# Patient Record
Sex: Male | Born: 1940
Health system: Southern US, Community
[De-identification: ages and names within clinical notes are randomized; demographics above are authoritative.]

## PROBLEM LIST (undated history)

## (undated) DIAGNOSIS — C4490 Unspecified malignant neoplasm of skin, unspecified: Secondary | ICD-10-CM

## (undated) DIAGNOSIS — J309 Allergic rhinitis, unspecified: Secondary | ICD-10-CM

## (undated) DIAGNOSIS — R350 Frequency of micturition: Secondary | ICD-10-CM

## (undated) DIAGNOSIS — B029 Zoster without complications: Secondary | ICD-10-CM

## (undated) DIAGNOSIS — B0229 Other postherpetic nervous system involvement: Secondary | ICD-10-CM

## (undated) DIAGNOSIS — J449 Chronic obstructive pulmonary disease, unspecified: Secondary | ICD-10-CM

## (undated) DIAGNOSIS — H543 Unqualified visual loss, both eyes: Secondary | ICD-10-CM

## (undated) DIAGNOSIS — D509 Iron deficiency anemia, unspecified: Secondary | ICD-10-CM

## (undated) DIAGNOSIS — E785 Hyperlipidemia, unspecified: Secondary | ICD-10-CM

## (undated) DIAGNOSIS — R7302 Impaired glucose tolerance (oral): Secondary | ICD-10-CM

## (undated) DIAGNOSIS — N189 Chronic kidney disease, unspecified: Secondary | ICD-10-CM

## (undated) DIAGNOSIS — Z0000 Encounter for general adult medical examination without abnormal findings: Secondary | ICD-10-CM

## (undated) DIAGNOSIS — I219 Acute myocardial infarction, unspecified: Secondary | ICD-10-CM

## (undated) DIAGNOSIS — I509 Heart failure, unspecified: Secondary | ICD-10-CM

## (undated) DIAGNOSIS — N400 Enlarged prostate without lower urinary tract symptoms: Secondary | ICD-10-CM

## (undated) DIAGNOSIS — R972 Elevated prostate specific antigen [PSA]: Secondary | ICD-10-CM

## (undated) DIAGNOSIS — R5383 Other fatigue: Secondary | ICD-10-CM

## (undated) DIAGNOSIS — Z125 Encounter for screening for malignant neoplasm of prostate: Secondary | ICD-10-CM

## (undated) DIAGNOSIS — N529 Male erectile dysfunction, unspecified: Secondary | ICD-10-CM

## (undated) DIAGNOSIS — K5090 Crohn's disease, unspecified, without complications: Secondary | ICD-10-CM

## (undated) DIAGNOSIS — R300 Dysuria: Secondary | ICD-10-CM

## (undated) DIAGNOSIS — I10 Essential (primary) hypertension: Secondary | ICD-10-CM

## (undated) DIAGNOSIS — F1010 Alcohol abuse, uncomplicated: Secondary | ICD-10-CM

## (undated) DIAGNOSIS — I25810 Atherosclerosis of coronary artery bypass graft(s) without angina pectoris: Secondary | ICD-10-CM

## (undated) HISTORY — DX: Impaired glucose tolerance (oral): R73.02

## (undated) HISTORY — PX: CORONARY ARTERY BYPASS GRAFT: SHX141

## (undated) HISTORY — DX: Crohn's disease, unspecified, without complications: K50.90

## (undated) HISTORY — DX: Dysuria: R30.0

## (undated) HISTORY — DX: Benign prostatic hyperplasia without lower urinary tract symptoms: N40.0

## (undated) HISTORY — DX: Other fatigue: R53.83

## (undated) HISTORY — DX: Hyperlipidemia, unspecified: E78.5

## (undated) HISTORY — DX: Elevated prostate specific antigen (PSA): R97.20

## (undated) HISTORY — DX: Iron deficiency anemia, unspecified: D50.9

## (undated) HISTORY — DX: Zoster without complications: B02.9

## (undated) HISTORY — DX: Allergic rhinitis, unspecified: J30.9

## (undated) HISTORY — DX: Encounter for general adult medical examination without abnormal findings: Z00.00

## (undated) HISTORY — PX: CORONARY ANGIOPLASTY WITH STENT PLACEMENT: SHX49

## (undated) HISTORY — DX: Atherosclerosis of coronary artery bypass graft(s) without angina pectoris: I25.810

## (undated) HISTORY — DX: Male erectile dysfunction, unspecified: N52.9

## (undated) HISTORY — DX: Acute myocardial infarction, unspecified: I21.9

## (undated) HISTORY — DX: Alcohol abuse, uncomplicated: F10.10

## (undated) HISTORY — DX: Other postherpetic nervous system involvement: B02.29

## (undated) HISTORY — DX: Unqualified visual loss, both eyes: H54.3

## (undated) HISTORY — DX: Essential (primary) hypertension: I10

## (undated) HISTORY — DX: Chronic obstructive pulmonary disease, unspecified: J44.9

## (undated) HISTORY — DX: Chronic kidney disease, unspecified: N18.9

## (undated) HISTORY — DX: Unspecified malignant neoplasm of skin, unspecified: C44.90

## (undated) HISTORY — DX: Heart failure, unspecified: I50.9

## (undated) HISTORY — DX: Frequency of micturition: R35.0

## (undated) HISTORY — DX: Encounter for screening for malignant neoplasm of prostate: Z12.5

---

## 1998-05-22 ENCOUNTER — Ambulatory Visit (HOSPITAL_COMMUNITY): Admission: RE | Admit: 1998-05-22 | Discharge: 1998-05-22 | Payer: Self-pay | Admitting: Internal Medicine

## 2003-10-01 ENCOUNTER — Emergency Department (HOSPITAL_COMMUNITY): Admission: AD | Admit: 2003-10-01 | Discharge: 2003-10-01 | Payer: Self-pay | Admitting: Family Medicine

## 2004-12-31 ENCOUNTER — Ambulatory Visit: Payer: Self-pay | Admitting: Cardiology

## 2005-01-08 ENCOUNTER — Ambulatory Visit: Payer: Self-pay

## 2005-04-04 ENCOUNTER — Ambulatory Visit: Payer: Self-pay | Admitting: Cardiology

## 2006-01-06 ENCOUNTER — Ambulatory Visit: Payer: Self-pay | Admitting: Cardiology

## 2006-03-25 ENCOUNTER — Ambulatory Visit: Payer: Self-pay | Admitting: Cardiology

## 2006-10-06 ENCOUNTER — Ambulatory Visit: Payer: Self-pay | Admitting: Internal Medicine

## 2006-10-07 ENCOUNTER — Ambulatory Visit: Payer: Self-pay | Admitting: Internal Medicine

## 2006-12-18 ENCOUNTER — Ambulatory Visit: Payer: Self-pay | Admitting: Cardiology

## 2006-12-30 ENCOUNTER — Ambulatory Visit: Payer: Self-pay

## 2007-04-05 ENCOUNTER — Ambulatory Visit: Payer: Self-pay | Admitting: Internal Medicine

## 2007-04-05 DIAGNOSIS — Z85828 Personal history of other malignant neoplasm of skin: Secondary | ICD-10-CM

## 2007-04-05 DIAGNOSIS — N400 Enlarged prostate without lower urinary tract symptoms: Secondary | ICD-10-CM

## 2007-04-05 DIAGNOSIS — F1021 Alcohol dependence, in remission: Secondary | ICD-10-CM

## 2007-04-05 DIAGNOSIS — D509 Iron deficiency anemia, unspecified: Secondary | ICD-10-CM

## 2007-04-05 DIAGNOSIS — I252 Old myocardial infarction: Secondary | ICD-10-CM

## 2007-04-05 LAB — CONVERTED CEMR LAB
ALT: 32 units/L (ref 0–53)
AST: 31 units/L (ref 0–37)
Albumin: 3.8 g/dL (ref 3.5–5.2)
Alkaline Phosphatase: 51 units/L (ref 39–117)
BUN: 16 mg/dL (ref 6–23)
Basophils Absolute: 0.1 10*3/uL (ref 0.0–0.1)
Basophils Relative: 1.8 % — ABNORMAL HIGH (ref 0.0–1.0)
Bilirubin Urine: NEGATIVE
Bilirubin, Direct: 0.1 mg/dL (ref 0.0–0.3)
CO2: 34 meq/L — ABNORMAL HIGH (ref 19–32)
Calcium: 10.3 mg/dL (ref 8.4–10.5)
Chloride: 109 meq/L (ref 96–112)
Cholesterol: 141 mg/dL (ref 0–200)
Creatinine, Ser: 1.2 mg/dL (ref 0.4–1.5)
Eosinophils Absolute: 0.1 10*3/uL (ref 0.0–0.6)
Eosinophils Relative: 3.1 % (ref 0.0–5.0)
GFR calc Af Amer: 78 mL/min
GFR calc non Af Amer: 64 mL/min
Glucose, Bld: 110 mg/dL — ABNORMAL HIGH (ref 70–99)
HCT: 37.1 % — ABNORMAL LOW (ref 39.0–52.0)
HDL: 39.3 mg/dL (ref >39.0)
Hemoglobin, Urine: NEGATIVE
Hemoglobin: 12.8 g/dL — ABNORMAL LOW (ref 13.0–17.0)
Ketones, ur: NEGATIVE mg/dL
LDL Cholesterol: 88 mg/dL (ref 0–99)
Leukocytes, UA: NEGATIVE
Lymphocytes Relative: 32.9 % (ref 12.0–46.0)
MCHC: 34.4 g/dL (ref 30.0–36.0)
MCV: 84.3 fL (ref 78.0–100.0)
Monocytes Absolute: 0.6 10*3/uL (ref 0.2–0.7)
Monocytes Relative: 15.4 % — ABNORMAL HIGH (ref 3.0–11.0)
Neutro Abs: 1.6 10*3/uL (ref 1.4–7.7)
Neutrophils Relative %: 46.8 % (ref 43.0–77.0)
Nitrite: NEGATIVE
PSA: 4.82 ng/mL — ABNORMAL HIGH (ref 0.10–4.00)
Platelets: 215 10*3/uL (ref 150–400)
Potassium: 5 meq/L (ref 3.5–5.1)
RBC: 4.41 M/uL (ref 4.22–5.81)
RDW: 12.9 % (ref 11.5–14.6)
Sodium: 146 meq/L — ABNORMAL HIGH (ref 135–145)
Specific Gravity, Urine: 1.015 (ref 1.000–1.03)
TSH: 3.31 microintl units/mL (ref 0.35–5.50)
Total Bilirubin: 1.3 mg/dL — ABNORMAL HIGH (ref 0.3–1.2)
Total CHOL/HDL Ratio: 3.6
Total Protein, Urine: NEGATIVE mg/dL
Total Protein: 7.7 g/dL (ref 6.0–8.3)
Triglycerides: 69 mg/dL (ref 0–149)
Urine Glucose: NEGATIVE mg/dL
Urobilinogen, UA: 0.2 (ref 0.0–1.0)
VLDL: 14 mg/dL (ref 0–40)
WBC: 3.6 10*3/uL — ABNORMAL LOW (ref 4.5–10.5)
pH: 6 (ref 5.0–8.0)

## 2007-12-31 ENCOUNTER — Ambulatory Visit: Payer: Self-pay | Admitting: Cardiology

## 2008-04-05 ENCOUNTER — Ambulatory Visit: Payer: Self-pay | Admitting: Internal Medicine

## 2008-04-05 DIAGNOSIS — R350 Frequency of micturition: Secondary | ICD-10-CM

## 2008-04-05 DIAGNOSIS — J309 Allergic rhinitis, unspecified: Secondary | ICD-10-CM

## 2008-04-05 DIAGNOSIS — I10 Essential (primary) hypertension: Secondary | ICD-10-CM

## 2008-04-05 DIAGNOSIS — I5022 Chronic systolic (congestive) heart failure: Secondary | ICD-10-CM | POA: Insufficient documentation

## 2008-04-05 DIAGNOSIS — J449 Chronic obstructive pulmonary disease, unspecified: Secondary | ICD-10-CM | POA: Insufficient documentation

## 2008-04-05 DIAGNOSIS — R972 Elevated prostate specific antigen [PSA]: Secondary | ICD-10-CM | POA: Insufficient documentation

## 2008-04-05 DIAGNOSIS — R5381 Other malaise: Secondary | ICD-10-CM | POA: Insufficient documentation

## 2008-04-05 DIAGNOSIS — E785 Hyperlipidemia, unspecified: Secondary | ICD-10-CM | POA: Insufficient documentation

## 2008-04-05 DIAGNOSIS — R5383 Other fatigue: Secondary | ICD-10-CM

## 2008-06-21 ENCOUNTER — Ambulatory Visit: Payer: Self-pay | Admitting: Cardiology

## 2008-10-09 ENCOUNTER — Telehealth: Payer: Self-pay | Admitting: Internal Medicine

## 2008-10-27 ENCOUNTER — Ambulatory Visit: Payer: Self-pay | Admitting: Internal Medicine

## 2008-10-27 DIAGNOSIS — R300 Dysuria: Secondary | ICD-10-CM

## 2008-10-27 DIAGNOSIS — B029 Zoster without complications: Secondary | ICD-10-CM | POA: Insufficient documentation

## 2008-10-27 LAB — CONVERTED CEMR LAB
Crystals: NEGATIVE
Leukocytes, UA: NEGATIVE
RBC / HPF: NONE SEEN
Specific Gravity, Urine: 1.015 (ref 1.000–1.035)
Urine Glucose: NEGATIVE mg/dL
Urobilinogen, UA: 0.2 (ref 0.0–1.0)
WBC, UA: NONE SEEN cells/hpf

## 2008-10-28 ENCOUNTER — Encounter: Payer: Self-pay | Admitting: Internal Medicine

## 2008-11-01 ENCOUNTER — Telehealth: Payer: Self-pay | Admitting: Internal Medicine

## 2008-11-13 ENCOUNTER — Ambulatory Visit: Payer: Self-pay | Admitting: Internal Medicine

## 2008-11-13 DIAGNOSIS — B0229 Other postherpetic nervous system involvement: Secondary | ICD-10-CM | POA: Insufficient documentation

## 2008-12-13 ENCOUNTER — Telehealth: Payer: Self-pay | Admitting: Internal Medicine

## 2008-12-13 DIAGNOSIS — H543 Unqualified visual loss, both eyes: Secondary | ICD-10-CM | POA: Insufficient documentation

## 2009-02-16 ENCOUNTER — Ambulatory Visit: Payer: Self-pay | Admitting: Cardiology

## 2009-03-01 ENCOUNTER — Telehealth (INDEPENDENT_AMBULATORY_CARE_PROVIDER_SITE_OTHER): Payer: Self-pay

## 2009-03-05 ENCOUNTER — Ambulatory Visit: Payer: Self-pay

## 2009-03-05 ENCOUNTER — Encounter: Payer: Self-pay | Admitting: Cardiovascular Disease

## 2009-04-02 ENCOUNTER — Telehealth (INDEPENDENT_AMBULATORY_CARE_PROVIDER_SITE_OTHER): Payer: Self-pay | Admitting: *Deleted

## 2009-04-18 ENCOUNTER — Telehealth: Payer: Self-pay | Admitting: Internal Medicine

## 2009-04-25 ENCOUNTER — Encounter: Payer: Self-pay | Admitting: Internal Medicine

## 2009-04-25 DIAGNOSIS — H409 Unspecified glaucoma: Secondary | ICD-10-CM | POA: Insufficient documentation

## 2009-05-08 ENCOUNTER — Telehealth (INDEPENDENT_AMBULATORY_CARE_PROVIDER_SITE_OTHER): Payer: Self-pay | Admitting: *Deleted

## 2009-06-15 ENCOUNTER — Encounter (INDEPENDENT_AMBULATORY_CARE_PROVIDER_SITE_OTHER): Payer: Self-pay | Admitting: *Deleted

## 2009-08-06 ENCOUNTER — Telehealth: Payer: Self-pay | Admitting: Cardiology

## 2009-08-15 ENCOUNTER — Ambulatory Visit: Payer: Self-pay | Admitting: Internal Medicine

## 2009-08-15 DIAGNOSIS — N529 Male erectile dysfunction, unspecified: Secondary | ICD-10-CM

## 2009-08-15 DIAGNOSIS — E739 Lactose intolerance, unspecified: Secondary | ICD-10-CM

## 2009-08-16 DIAGNOSIS — I25810 Atherosclerosis of coronary artery bypass graft(s) without angina pectoris: Secondary | ICD-10-CM | POA: Insufficient documentation

## 2009-08-18 HISTORY — PX: OTHER SURGICAL HISTORY: SHX169

## 2009-08-23 ENCOUNTER — Ambulatory Visit: Payer: Self-pay | Admitting: Cardiology

## 2009-10-01 ENCOUNTER — Encounter: Payer: Self-pay | Admitting: Internal Medicine

## 2009-11-28 ENCOUNTER — Telehealth: Payer: Self-pay | Admitting: Cardiology

## 2009-11-28 ENCOUNTER — Telehealth: Payer: Self-pay | Admitting: Internal Medicine

## 2010-01-15 ENCOUNTER — Ambulatory Visit: Payer: Self-pay | Admitting: Internal Medicine

## 2010-01-15 DIAGNOSIS — R609 Edema, unspecified: Secondary | ICD-10-CM

## 2010-03-14 ENCOUNTER — Ambulatory Visit: Payer: Self-pay | Admitting: Cardiology

## 2010-03-26 ENCOUNTER — Ambulatory Visit (HOSPITAL_COMMUNITY): Admission: RE | Admit: 2010-03-26 | Discharge: 2010-03-26 | Payer: Self-pay | Admitting: Cardiology

## 2010-03-26 ENCOUNTER — Encounter: Payer: Self-pay | Admitting: Cardiology

## 2010-03-26 ENCOUNTER — Ambulatory Visit: Payer: Self-pay | Admitting: Cardiology

## 2010-03-26 ENCOUNTER — Ambulatory Visit: Payer: Self-pay

## 2010-04-10 ENCOUNTER — Ambulatory Visit: Payer: Self-pay | Admitting: Cardiology

## 2010-04-12 ENCOUNTER — Telehealth: Payer: Self-pay | Admitting: Cardiology

## 2010-04-12 LAB — CONVERTED CEMR LAB
BUN: 20 mg/dL (ref 6–23)
Calcium: 9.9 mg/dL (ref 8.4–10.5)
Creatinine, Ser: 1.4 mg/dL (ref 0.4–1.5)
GFR calc non Af Amer: 67.29 mL/min (ref >60)
Glucose, Bld: 97 mg/dL (ref 70–99)
Potassium: 5.1 meq/L (ref 3.5–5.1)

## 2010-06-11 ENCOUNTER — Ambulatory Visit: Payer: Self-pay | Admitting: Internal Medicine

## 2010-06-11 DIAGNOSIS — K4090 Unilateral inguinal hernia, without obstruction or gangrene, not specified as recurrent: Secondary | ICD-10-CM | POA: Insufficient documentation

## 2010-06-11 DIAGNOSIS — M79609 Pain in unspecified limb: Secondary | ICD-10-CM

## 2010-06-12 ENCOUNTER — Ambulatory Visit: Payer: Self-pay

## 2010-06-12 ENCOUNTER — Encounter: Payer: Self-pay | Admitting: Internal Medicine

## 2010-07-02 ENCOUNTER — Encounter: Payer: Self-pay | Admitting: Internal Medicine

## 2010-07-04 ENCOUNTER — Encounter: Payer: Self-pay | Admitting: Cardiology

## 2010-07-04 ENCOUNTER — Ambulatory Visit: Payer: Self-pay | Admitting: Cardiology

## 2010-07-08 ENCOUNTER — Telehealth: Payer: Self-pay | Admitting: Internal Medicine

## 2010-07-18 HISTORY — PX: OTHER SURGICAL HISTORY: SHX169

## 2010-07-22 ENCOUNTER — Ambulatory Visit (HOSPITAL_COMMUNITY)
Admission: RE | Admit: 2010-07-22 | Discharge: 2010-07-22 | Payer: Self-pay | Source: Home / Self Care | Admitting: Surgery

## 2010-07-29 ENCOUNTER — Telehealth: Payer: Self-pay | Admitting: Internal Medicine

## 2010-08-06 ENCOUNTER — Encounter: Payer: Self-pay | Admitting: Internal Medicine

## 2010-08-26 ENCOUNTER — Ambulatory Visit
Admission: RE | Admit: 2010-08-26 | Discharge: 2010-08-26 | Payer: Self-pay | Source: Home / Self Care | Attending: Internal Medicine | Admitting: Internal Medicine

## 2010-08-26 ENCOUNTER — Other Ambulatory Visit: Payer: Self-pay | Admitting: Internal Medicine

## 2010-08-26 LAB — CBC WITH DIFFERENTIAL/PLATELET
Basophils Absolute: 0 10*3/uL (ref 0.0–0.1)
Basophils Relative: 1 % (ref 0.0–3.0)
Eosinophils Absolute: 0.1 10*3/uL (ref 0.0–0.7)
Eosinophils Relative: 3.2 % (ref 0.0–5.0)
HCT: 33.8 % — ABNORMAL LOW (ref 39.0–52.0)
Hemoglobin: 11.3 g/dL — ABNORMAL LOW (ref 13.0–17.0)
Lymphocytes Relative: 21.9 % (ref 12.0–46.0)
Lymphs Abs: 1 10*3/uL (ref 0.7–4.0)
MCHC: 33.5 g/dL (ref 30.0–36.0)
MCV: 85.6 fl (ref 78.0–100.0)
Monocytes Absolute: 0.6 10*3/uL (ref 0.1–1.0)
Monocytes Relative: 12.5 % — ABNORMAL HIGH (ref 3.0–12.0)
Neutro Abs: 2.7 10*3/uL (ref 1.4–7.7)
Neutrophils Relative %: 61.4 % (ref 43.0–77.0)
Platelets: 212 10*3/uL (ref 150.0–400.0)
RBC: 3.95 Mil/uL — ABNORMAL LOW (ref 4.22–5.81)
RDW: 14.6 % (ref 11.5–14.6)
WBC: 4.4 10*3/uL — ABNORMAL LOW (ref 4.5–10.5)

## 2010-08-26 LAB — TSH: TSH: 2.67 u[IU]/mL (ref 0.35–5.50)

## 2010-08-26 LAB — BASIC METABOLIC PANEL
BUN: 19 mg/dL (ref 6–23)
CO2: 30 mEq/L (ref 19–32)
Calcium: 9.5 mg/dL (ref 8.4–10.5)
Chloride: 105 mEq/L (ref 96–112)
Creatinine, Ser: 1.3 mg/dL (ref 0.4–1.5)
GFR: 72.13 mL/min (ref >60.00)
Glucose, Bld: 99 mg/dL (ref 70–99)
Potassium: 5.1 mEq/L (ref 3.5–5.1)
Sodium: 139 mEq/L (ref 135–145)

## 2010-08-26 LAB — URINALYSIS
Bilirubin Urine: NEGATIVE
Hemoglobin, Urine: NEGATIVE
Ketones, ur: NEGATIVE
Leukocytes, UA: NEGATIVE
Nitrite: NEGATIVE
Specific Gravity, Urine: 1.01 (ref 1.000–1.030)
Total Protein, Urine: NEGATIVE
Urine Glucose: NEGATIVE
Urobilinogen, UA: 0.2 (ref 0.0–1.0)
pH: 6 (ref 5.0–8.0)

## 2010-08-26 LAB — HEPATIC FUNCTION PANEL
ALT: 22 U/L (ref 0–53)
AST: 25 U/L (ref 0–37)
Albumin: 3.6 g/dL (ref 3.5–5.2)
Alkaline Phosphatase: 61 U/L (ref 39–117)
Bilirubin, Direct: 0.1 mg/dL (ref 0.0–0.3)
Total Bilirubin: 0.8 mg/dL (ref 0.3–1.2)
Total Protein: 7.8 g/dL (ref 6.0–8.3)

## 2010-08-26 LAB — LIPID PANEL
Cholesterol: 146 mg/dL (ref 0–200)
HDL: 41.4 mg/dL (ref >39.00)
LDL Cholesterol: 95 mg/dL (ref 0–99)
Total CHOL/HDL Ratio: 4
Triglycerides: 47 mg/dL (ref 0.0–149.0)
VLDL: 9.4 mg/dL (ref 0.0–40.0)

## 2010-08-26 LAB — PSA: PSA: 4.03 ng/mL — ABNORMAL HIGH (ref 0.10–4.00)

## 2010-08-28 ENCOUNTER — Ambulatory Visit
Admission: RE | Admit: 2010-08-28 | Discharge: 2010-08-28 | Payer: Self-pay | Source: Home / Self Care | Attending: Internal Medicine | Admitting: Internal Medicine

## 2010-09-15 LAB — CONVERTED CEMR LAB
ALT: 24 units/L (ref 0–53)
ALT: 26 units/L (ref 0–53)
AST: 28 units/L (ref 0–37)
AST: 30 units/L (ref 0–37)
Albumin: 3.6 g/dL (ref 3.5–5.2)
Alkaline Phosphatase: 50 units/L (ref 39–117)
Alkaline Phosphatase: 57 units/L (ref 39–117)
BUN: 10 mg/dL (ref 6–23)
Bacteria, UA: NEGATIVE
Basophils Absolute: 0.1 10*3/uL (ref 0.0–0.1)
Basophils Relative: 1.4 % (ref 0.0–3.0)
Bilirubin Urine: NEGATIVE
Bilirubin, Direct: 0.2 mg/dL (ref 0.0–0.3)
CO2: 32 meq/L (ref 19–32)
Calcium: 9.3 mg/dL (ref 8.4–10.5)
Chloride: 109 meq/L (ref 96–112)
Cholesterol: 128 mg/dL (ref 0–200)
Cholesterol: 147 mg/dL (ref 0–200)
Creatinine, Ser: 1.3 mg/dL (ref 0.4–1.5)
Crystals: NEGATIVE
Eosinophils Absolute: 0.1 10*3/uL (ref 0.0–0.7)
Eosinophils Relative: 2.7 % (ref 0.0–5.0)
Eosinophils Relative: 3.2 % (ref 0.0–5.0)
GFR calc Af Amer: 71 mL/min
GFR calc non Af Amer: 59 mL/min
GFR calc non Af Amer: 70.42 mL/min (ref >60)
Glucose, Bld: 113 mg/dL — ABNORMAL HIGH (ref 70–99)
HCT: 37.4 % — ABNORMAL LOW (ref 39.0–52.0)
HCT: 39.9 % (ref 39.0–52.0)
HDL: 40.6 mg/dL (ref >39.0)
Hemoglobin, Urine: NEGATIVE
Hemoglobin, Urine: NEGATIVE
Hemoglobin: 12.4 g/dL — ABNORMAL LOW (ref 13.0–17.0)
Hemoglobin: 13 g/dL (ref 13.0–17.0)
Hgb A1c MFr Bld: 5.4 % (ref 4.6–6.5)
Ketones, ur: NEGATIVE mg/dL
LDL Cholesterol: 80 mg/dL (ref 0–99)
LDL Cholesterol: 87 mg/dL (ref 0–99)
Leukocytes, UA: NEGATIVE
Leukocytes, UA: NEGATIVE
Lymphocytes Relative: 25 % (ref 12.0–46.0)
Lymphs Abs: 1 10*3/uL (ref 0.7–4.0)
MCHC: 33.3 g/dL (ref 30.0–36.0)
MCV: 85.3 fL (ref 78.0–100.0)
Monocytes Absolute: 0.6 10*3/uL (ref 0.1–1.0)
Monocytes Relative: 12.7 % — ABNORMAL HIGH (ref 3.0–12.0)
Monocytes Relative: 16.7 % — ABNORMAL HIGH (ref 3.0–12.0)
Mucus, UA: NEGATIVE
Neutro Abs: 1.9 10*3/uL (ref 1.4–7.7)
Neutro Abs: 2.1 10*3/uL (ref 1.4–7.7)
Neutrophils Relative %: 53.7 % (ref 43.0–77.0)
Nitrite: NEGATIVE
Nitrite: NEGATIVE
PSA: 3.74 ng/mL (ref 0.10–4.00)
Platelets: 174 10*3/uL (ref 150.0–400.0)
Platelets: 195 10*3/uL (ref 150–400)
Potassium: 4.6 meq/L (ref 3.5–5.1)
Potassium: 5.1 meq/L (ref 3.5–5.1)
RBC / HPF: NONE SEEN
RBC: 4.38 M/uL (ref 4.22–5.81)
RDW: 13.8 % (ref 11.5–14.6)
Sodium: 141 meq/L (ref 135–145)
Sodium: 145 meq/L (ref 135–145)
Specific Gravity, Urine: 1.02 (ref 1.000–1.03)
TSH: 2.32 microintl units/mL (ref 0.35–5.50)
TSH: 2.7 microintl units/mL (ref 0.35–5.50)
Total Bilirubin: 1.1 mg/dL (ref 0.3–1.2)
Total Bilirubin: 1.2 mg/dL (ref 0.3–1.2)
Total CHOL/HDL Ratio: 3.2
Total Protein, Urine: NEGATIVE mg/dL
Total Protein, Urine: NEGATIVE mg/dL
Total Protein: 7.5 g/dL (ref 6.0–8.3)
Triglycerides: 37 mg/dL (ref 0–149)
Urine Glucose: NEGATIVE mg/dL
Urobilinogen, UA: 0.2 (ref 0.0–1.0)
Urobilinogen, UA: 0.2 (ref 0.0–1.0)
VLDL: 7 mg/dL (ref 0–40)
WBC: 3.6 10*3/uL — ABNORMAL LOW (ref 4.5–10.5)
WBC: 3.8 10*3/uL — ABNORMAL LOW (ref 4.5–10.5)
pH: 6 (ref 5.0–8.0)

## 2010-09-17 ENCOUNTER — Ambulatory Visit
Admission: RE | Admit: 2010-09-17 | Discharge: 2010-09-17 | Payer: Self-pay | Source: Home / Self Care | Attending: Cardiology | Admitting: Cardiology

## 2010-09-19 NOTE — Letter (Signed)
Summary: Clearance Letter  [employer], Main Office  [address]   [place], [place] [address]   Phone: [phone]  Fax: [phone]    July 04, 2010  Re:     [facility] Address:   [address] [place], [place]  [address] DOB:     1941/01/04 MRN:     [id number]   Dear            Sincerely,  Lisabeth Devoid RN

## 2010-09-19 NOTE — Progress Notes (Signed)
Summary: rtn call for lab results   Phone Note Call from Patient Call back at [phone]   Caller: Patient Reason for Call: Talk to Nurse, Talk to Doctor, Lab or Test Results Summary of Call: pt rtn call to get lab work results Initial call taken by: Omer Jack,  April 12, 2010 10:19 AM  Follow-up for Phone Call        Pt is aware of lab results. Mylo Red RN

## 2010-09-19 NOTE — Assessment & Plan Note (Signed)
Summary: 6 month rov/sl      Allergies Added: NKDA  Visit Type:  6 mo f/u Primary Provider:  [name]  CC:  no cardiac complaints today.  History of Present Illness: [name] returns for evaluation and management of his coronary artery disease, history of bypass surgery, history of inferior apical myocardial infarction, hypertension, and hyperlipidemia.  He's doing remarkably well. He is having no angina or ischemic symptoms. He denies any orthopnea PND or peripheral edema.  Stress Myoview July 2010 showed stable findings with minimal lateral ischemia. His EF was 53%. It was felt to be a low risk and are treating him medically.  His lipids and other blood work are being followed by primary care. I reviewed these today.  Current Medications (verified): 1)  Centrum   Tabs (Multiple Vitamins-Minerals) .... Take 1 Tablet By Mouth Once A Day 2)  Bayer Aspirin Ec Low Dose 81 Mg  Tbec (Aspirin) .... Take 1 Tablet By Mouth Once A Day 3)  Carvedilol 6.25 Mg  Tabs (Carvedilol) .... Take 1 Tablet By Mouth Two Times A Day 4)  Simvastatin 40 Mg  Tabs (Simvastatin) .Marland Kitchen.. 1 By Mouth Once Daily 5)  Diovan Hct 320-12.5 Mg  Tabs (Valsartan-Hydrochlorothiazide) .Marland Kitchen.. 1 By Mouth Once Daily 6)  Oxybutynin Chloride 5 Mg  Xr24h-Tab (Oxybutynin Chloride) .Marland Kitchen.. 1 By Mouth Once Daily 7)  Lyrica 50 Mg Caps (Pregabalin) .... Out of Med [name] Will Call Dr. Jonny Ruiz For Refill 8)  Cialis 20 Mg Tabs (Tadalafil) .Marland Kitchen.. 1po Once Daily As Needed  Allergies (verified): No Known Drug Allergies  Past History:  Past Medical History: Last updated: 08/16/2009 CAD, ARTERY BYPASS GRAFT (ICD-414.04) MYOCARDIAL INFARCTION, HX OF (ICD-412) CONGESTIVE HEART FAILURE (ICD-428.0) HYPERLIPIDEMIA (ICD-272.4) HYPERTENSION (ICD-401.9) FATIGUE (ICD-780.79) COPD (ICD-496) PSA, INCREASED (ICD-790.93) ERECTILE DYSFUNCTION, ORGANIC (ICD-607.84) PREVENTIVE HEALTH CARE (ICD-V70.0) GLUCOSE INTOLERANCE  (ICD-271.3) GLAUCOMA (ICD-365.9) VISION IMPAIR BOTH EYES IMPAIR LEVEL NFS (ICD-369.20) POSTHERPETIC NEURALGIA (ICD-053.19) DYSURIA (ICD-788.1) SHINGLES (ICD-053.9) ALLERGIC RHINITIS (ICD-477.9) PSA, INCREASED (ICD-790.93) SPECIAL SCREENING MALIGNANT NEOPLASM OF PROSTATE (ICD-V76.44) FREQUENCY, URINARY (ICD-788.41) SKIN CANCER, HX OF (ICD-V10.83) BENIGN PROSTATIC HYPERTROPHY (ICD-600.00) ANEMIA-IRON DEFICIENCY (ICD-280.9) ALCOHOL ABUSE, HX OF (ICD-V11.3) CROHN'S DISEASE (ICD-555.9)    Past Surgical History: Last updated: 02/08/2009 Coronary artery bypass graft... 11 years ago.Marland KitchenHe has an EF of about 45%, and his last stress Myoview   showed no ischemia Dec 30, 2006.  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 Head:  normocephalic and atraumatic Eyes:  PERRLA/EOM intact; conjunctiva and lids normal. Neck:  Neck supple, no JVD. No masses, thyromegaly or abnormal cervical nodes. Chest Mads Borgmeyer:  whealed median sternotomy Lungs:  Clear bilaterally to auscultation and percussion. Heart:  Non-displaced PMI, chest non-tender; regular rate and rhythm, S1, S2 without murmurs, rubs or gallops. Carotid upstroke normal, no bruit. Normal abdominal aortic size, no  bruits. Femorals normal pulses, no bruits. Pedals normal pulses. No edema, no varicosities. Msk:  Back normal, normal gait. Muscle strength and tone normal. Pulses:  pulses normal in all 4 extremities Extremities:  No clubbing or cyanosis. Neurologic:  Alert and oriented x 3. Skin:  Intact without lesions or rashes. Psych:  Normal affect.   EKG  Procedure date:  08/23/2009  Findings:      sinus bradycardia, nonspecific changes, stable EKG  Impression & Recommendations:  Problem # 1:  CAD, ARTERY BYPASS GRAFT (ICD-414.04) Assessment Unchanged  His updated medication list for this problem includes:    Bayer Aspirin Ec Low Dose 81 Mg Tbec (Aspirin) .Marland Kitchen... Take 1 tablet by mouth once a day    Carvedilol 6.25 Mg Tabs (Carvedilol) .Marland Kitchen... Take 1 tablet by mouth two times a day  Orders: EKG w/ Interpretation ([address])  Problem # 2:  MYOCARDIAL INFARCTION, HX OF (ICD-412) Assessment: Unchanged  His updated medication list for this problem includes:    Bayer Aspirin Ec Low Dose 81 Mg Tbec (Aspirin) .Marland Kitchen... Take 1 tablet by mouth once a day    Carvedilol 6.25 Mg Tabs (Carvedilol) .Marland Kitchen... Take 1 tablet by mouth two times a day  Problem # 3:  HYPERLIPIDEMIA (ICD-272.4) Assessment: Unchanged  His updated medication list for this problem includes:    Simvastatin 40 Mg Tabs (Simvastatin) .Marland Kitchen... 1 by mouth once daily  Problem # 4:  HYPERTENSION (ICD-401.9) Assessment: Improved  His updated medication list for this problem includes:    Bayer Aspirin Ec Low Dose 81 Mg Tbec (Aspirin) .Marland Kitchen... Take 1 tablet by mouth once a day    Carvedilol 6.25 Mg Tabs (Carvedilol) .Marland Kitchen... Take 1 tablet by mouth two times a day    Diovan Hct 320-12.5 Mg Tabs (Valsartan-hydrochlorothiazide) .Marland Kitchen... 1 by mouth once daily  Patient Instructions: 1)  Your physician recommends that you schedule a follow-up appointment in: 6 months due with dr Ahijah Devery july 2011 2)  Your physician recommends that you continue on your current  medications as directed. Please refer to the Current Medication list given to you today.

## 2010-09-19 NOTE — Consult Note (Signed)
Summary: [facility]   Imported By: Sherian Rein 08/01/2010 10:16:41  _____________________________________________________________________  External Attachment:    Type:   Image     Comment:   External Document  Appended Document: [facility] He is cleared for surgery. Please send clearance letter to Dr Rayburn Ma

## 2010-09-19 NOTE — Letter (Signed)
Summary: Clearance Letter  [employer], Main Office  [address]   [place], [place] [address]   Phone: [phone]  Fax: [phone]    July 04, 2010  Re:     Gary Levy Address:   [address] [place], [place]  [address] DOB:     [birth year] MRN:     [id number]   Dear Dr. Magnus Ivan  [name] is at low risk cardiac wise for surgery. Patient is cleared to stop his aspirin 5 days prior to surgery. Restart aspirin as soon as possible after surgery at your discretion.  Please contact us if you should have any questions at [phone].      Sincerely,      Dr. Maisie Fus Zackeriah Kissler/ Lisabeth Devoid RN

## 2010-09-19 NOTE — Assessment & Plan Note (Signed)
Summary: right foot swollen/cd   Vital Signs:  Patient profile:   70 year old male Height:      71 inches Weight:      166.25 pounds BMI:     23.27 O2 Sat:      99 % on Room air Temp:     99 degrees F oral Pulse rate:   73 / minute BP sitting:   110 / 72  (left arm) Cuff size:   regular  Vitals Entered By: Zella Ball Ewing CMA Duncan Dull) (June 11, 2010 3:12 PM)  O2 Flow:  Room air CC: Right foot swollen/RE   Primary Care Provider:  [name]  CC:  Right foot swollen/RE.  History of Present Illness: here with acute visit  approx 1 wk after helping push a car;  now c/o persitent pain and diffuse sweling to the right leg below the knee after a tearing sensation to the mid calf;  some pain radiates to the chilles but no change in color to the leg;  has some mild numbness with the sweling, pain worse to ambulate and has not been able to walk for excericse as he occasinally does;  no falls but favors the leg and hard to climb steps due to pain (no weakness );  no lower back pain, other falls or injury.  Also c/o swelling to the left groin area , only noticed a few days ago, no pain, and no n/v, bowel habit change, fever or blood.  Pt denies CP, worsening sob, doe, wheezing, orthopnea, pnd, worsening LE edema, palps, dizziness or syncope  Pt denies new neuro symptoms such as headache, facial or extremity weakness No fever, wt loss, night sweats, loss of appetite or other constitutional symptoms   Problems Prior to Update: 1)  Inguinal Hernia, Left  (ICD-550.90) 2)  Calf Pain, Right  (ICD-729.5) 3)  Edema  (ICD-782.3) 4)  Cad, Artery Bypass Graft  (ICD-414.04) 5)  Myocardial Infarction, Hx of  (ICD-412) 6)  Congestive Heart Failure  (ICD-428.0) 7)  Hyperlipidemia  (ICD-272.4) 8)  Hypertension  (ICD-401.9) 9)  Fatigue  (ICD-780.79) 10)  COPD  (ICD-496) 11)  Psa, Increased  (ICD-790.93) 12)  Erectile Dysfunction, Organic  (ICD-607.84) 13)  Preventive Health Care  (ICD-V70.0) 14)   Glucose Intolerance  (ICD-271.3) 15)  Glaucoma  (ICD-365.9) 16)  Vision Impair Both Eyes Impair Level Nfs  (ICD-369.20) 17)  Postherpetic Neuralgia  (ICD-053.19) 18)  Dysuria  (ICD-788.1) 19)  Shingles  (ICD-053.9) 20)  Allergic Rhinitis  (ICD-477.9) 21)  Psa, Increased  (ICD-790.93) 22)  Special Screening Malignant Neoplasm of Prostate  (ICD-V76.44) 23)  Frequency, Urinary  (ICD-788.41) 24)  Skin Cancer, Hx of  (ICD-V10.83) 25)  Benign Prostatic Hypertrophy  (ICD-600.00) 26)  Anemia-iron Deficiency  (ICD-280.9) 27)  Alcohol Abuse, Hx of  (ICD-V11.3)  Medications Prior to Update: 1)  Centrum   Tabs (Multiple Vitamins-Minerals) .... Take 1 Tablet By Mouth Once A Day 2)  Bayer Aspirin Ec Low Dose 81 Mg  Tbec (Aspirin) .... Take 1 Tablet By Mouth Once A Day 3)  Carvedilol 6.25 Mg  Tabs (Carvedilol) .... Take 1 Tablet By Mouth Two Times A Day 4)  Simvastatin 40 Mg  Tabs (Simvastatin) .Marland Kitchen.. 1 By Mouth Once Daily 5)  Diovan Hct 320-25 Mg Tabs (Valsartan-Hydrochlorothiazide) .Marland Kitchen.. 1 By Mouth Once Daily 6)  Lyrica 50 Mg Caps (Pregabalin) .Marland Kitchen.. 1 Tab At Bedtime 7)  Cialis 20 Mg Tabs (Tadalafil) .Marland Kitchen.. 1po Once Daily As Needed 8)  Nitrostat  0.4 Mg Subl (Nitroglycerin) .Marland Kitchen.. 1 Tablet Under Tongue At Onset of Chest Pain; You May Repeat Every 5 Minutes For Up To 3 Doses. 9)  Spironolactone 25 Mg Tabs (Spironolactone) .... Take 1 Tablet Daily  Current Medications (verified): 1)  Centrum   Tabs (Multiple Vitamins-Minerals) .... Take 1 Tablet By Mouth Once A Day 2)  Bayer Aspirin Ec Low Dose 81 Mg  Tbec (Aspirin) .... Take 1 Tablet By Mouth Once A Day 3)  Carvedilol 6.25 Mg  Tabs (Carvedilol) .... Take 1 Tablet By Mouth Two Times A Day 4)  Simvastatin 40 Mg  Tabs (Simvastatin) .Marland Kitchen.. 1 By Mouth Once Daily 5)  Diovan Hct 320-25 Mg Tabs (Valsartan-Hydrochlorothiazide) .Marland Kitchen.. 1 By Mouth Once Daily 6)  Lyrica 50 Mg Caps (Pregabalin) .Marland Kitchen.. 1 Tab At Bedtime 7)  Cialis 20 Mg Tabs (Tadalafil) .Marland Kitchen.. 1po Once Daily As  Needed 8)  Nitrostat 0.4 Mg Subl (Nitroglycerin) .Marland Kitchen.. 1 Tablet Under Tongue At Onset of Chest Pain; You May Repeat Every 5 Minutes For Up To 3 Doses. 9)  Spironolactone 25 Mg Tabs (Spironolactone) .... Take 1 Tablet Daily 10)  Tramadol Hcl 50 Mg Tabs (Tramadol Hcl) .Marland Kitchen.. 1 By Mouth Q 6 Hrs As Needed Pain  Allergies (verified): No Known Drug Allergies  Past History:  Past Medical History: Last updated: 08/16/2009 CAD, ARTERY BYPASS GRAFT (ICD-414.04) MYOCARDIAL INFARCTION, HX OF (ICD-412) CONGESTIVE HEART FAILURE (ICD-428.0) HYPERLIPIDEMIA (ICD-272.4) HYPERTENSION (ICD-401.9) FATIGUE (ICD-780.79) COPD (ICD-496) PSA, INCREASED (ICD-790.93) ERECTILE DYSFUNCTION, ORGANIC (ICD-607.84) PREVENTIVE HEALTH CARE (ICD-V70.0) GLUCOSE INTOLERANCE (ICD-271.3) GLAUCOMA (ICD-365.9) VISION IMPAIR BOTH EYES IMPAIR LEVEL NFS (ICD-369.20) POSTHERPETIC NEURALGIA (ICD-053.19) DYSURIA (ICD-788.1) SHINGLES (ICD-053.9) ALLERGIC RHINITIS (ICD-477.9) PSA, INCREASED (ICD-790.93) SPECIAL SCREENING MALIGNANT NEOPLASM OF PROSTATE (ICD-V76.44) FREQUENCY, URINARY (ICD-788.41) SKIN CANCER, HX OF (ICD-V10.83) BENIGN PROSTATIC HYPERTROPHY (ICD-600.00) ANEMIA-IRON DEFICIENCY (ICD-280.9) ALCOHOL ABUSE, HX OF (ICD-V11.3) CROHN'S DISEASE (ICD-555.9)    Past Surgical History: Last updated: 02/08/2009 Coronary artery bypass graft... 11 years ago.Marland KitchenHe has an EF of about 45%, and his last stress Myoview   showed no ischemia Dec 30, 2006.  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   Head:  normocephalic and atraumatic.   Eyes:  vision grossly intact, pupils equal, and  pupils round.   Ears:  R ear normal and L ear normal.   Nose:  no external deformity and no nasal discharge.   Mouth:  no gingival abnormalities and pharynx pink and moist.   Neck:  supple and no masses.   Lungs:  normal respiratory effort and normal breath sounds.   Heart:  normal rate and regular rhythm.   Abdomen:  soft, non-tender, and normal bowel sounds.   Genitalia:  small 1+ LIH, mild tender but reducible Msk:  diffuse RLE sweling below the knee, right knee without swelling or tender and has FROM; primary tender is to the calf and somwehat the achilles tendon Pulses:  1+ bilat dorsalis pedis Extremities:  no edema, no erythema  Neurologic:  strength normal in all lower extremities and sensation intact to light touch.     Impression & Recommendations:  Problem # 1:  CALF PAIN, RIGHT (ICD-729.5)  suspect right gastoc tear,  possibly lengthwise, which could worsen, also cant r/o DVT - for RLE venous doppler, refer to ortho - may need MRI right leg;  for tramadol as needed, elevation, and limit walking to essential only, and use crutch as much as possible at least until seen per ortho  Orders: Radiology Referral (Radiology) Orthopedic Surgeon Referral (Ortho Surgeon)  Problem # 2:  INGUINAL HERNIA, LEFT (ICD-550.90) mild to mod discomfort, reducible; afeb , for surgical referral for consideratin for elective surgury Orders: Surgical Referral (Surgery)  Problem # 3:  HYPERTENSION (ICD-401.9)  His updated medication list for this problem includes:    Carvedilol 6.25 Mg Tabs (Carvedilol) .Marland Kitchen... Take 1 tablet by mouth two times a day    Diovan Hct 320-25 Mg Tabs (Valsartan-hydrochlorothiazide) .Marland Kitchen... 1 by mouth once daily    Spironolactone 25 Mg Tabs (Spironolactone) .Marland Kitchen... Take 1 tablet daily  BP today: 110/72 Prior BP: 100/70 (03/14/2010)  Labs Reviewed: K+: 5.1 (04/10/2010) Creat: : 1.4 (04/10/2010)   Chol: 147 (08/15/2009)   HDL: 48.30 (08/15/2009)   LDL: 87 (08/15/2009)    TG: 61.0 (08/15/2009) stable overall by hx and exam, ok to continue meds/tx as is   Complete Medication List: 1)  Centrum Tabs (Multiple vitamins-minerals) .... Take 1 tablet by mouth once a day 2)  Bayer Aspirin Ec Low Dose 81 Mg Tbec (Aspirin) .... Take 1 tablet by mouth once a day 3)  Carvedilol 6.25 Mg Tabs (Carvedilol) .... Take 1 tablet by mouth two times a day 4)  Simvastatin 40 Mg Tabs (Simvastatin) .Marland Kitchen.. 1 by mouth once daily 5)  Diovan Hct 320-25 Mg Tabs (Valsartan-hydrochlorothiazide) .Marland Kitchen.. 1 by mouth once daily 6)  Lyrica 50 Mg Caps (Pregabalin) .Marland Kitchen.. 1 tab at bedtime 7)  Cialis 20 Mg Tabs (Tadalafil) .Marland Kitchen.. 1po once daily as needed 8)  Nitrostat 0.4 Mg Subl (Nitroglycerin) .Marland Kitchen.. 1 tablet under tongue at onset of chest pain; you may repeat every 5 minutes for up to 3 doses. 9)  Spironolactone 25 Mg Tabs (Spironolactone) .... Take 1 tablet daily 10)  Tramadol Hcl 50 Mg Tabs (Tramadol hcl) .Marland Kitchen.. 1 by mouth q 6 hrs as needed pain  Other Orders: Flu Vaccine 71yrs + MEDICARE PATIENTS (E4540) Administration Flu vaccine - MCR (J8119)  Patient Instructions: 1)  Please take all new medications as prescribed - the pain medicine, or use tylenol 8 hr - 1-2 three times a day as needed for pain 2)  Continue all previous medications as before this visit  3)  You will be contacted about the referral(s) to: right leg ultrasound to make sure no blood clot; as well as orthopedic referral for the right leg, as well as general surgury referral for the left inguinal hernia 4)  you had the flu shot today 5)  Please schedule a follow-up appointment as planned Prescriptions: TRAMADOL HCL 50 MG TABS (TRAMADOL HCL) 1 by mouth q 6 hrs as needed pain  #60 x 0   Entered and Authorized by:   [name]   Signed by:   [name] on 06/11/2010   Method used:   Print then Give to Patient   RxID:   [id number]    Orders Added: 1)  Flu Vaccine 47yrs + MEDICARE PATIENTS [Q2039] 2)  Administration  Flu vaccine - MCR [G0008] 3)  Radiology Referral [Radiology] 4)  Orthopedic Surgeon Referral [Ortho Surgeon] 5)  Surgical Referral [Surgery] 6)  Est. Patient Level IV [84696]   Flu Vaccine Consent Questions  Do you have a history of severe allergic reactions to this vaccine? no    Any prior history of allergic reactions to egg and/or gelatin? no    Do you have a sensitivity to the preservative Thimersol? no    Do you have a past history of Guillan-Barre Syndrome? no    Do you currently have an acute febrile illness? no    Have you ever had a severe reaction to latex? no    Vaccine information given and explained to patient? yes    Are you currently pregnant? no    Lot Number:[id number]   Exp Date:02/15/2011   Site Given  Left Deltoid [id number]

## 2010-09-19 NOTE — Miscellaneous (Signed)
Summary: Orders Update  Clinical Lists Changes  Orders: Added new Test order of Venous Duplex Lower Extremity (Venous Duplex Lower) - Signed 

## 2010-09-19 NOTE — Progress Notes (Signed)
Summary: Rx request  Phone Note Call from Patient Call back at Home Phone [phone]   Caller: [facility] Summary of Call: pt;s pouse called stating that [facility] Pt is requesting Rx to local pharmacy. Initial call taken by: Margaret Pyle, CMA,  November 28, 2009 10:04 AM    Prescriptions: CIALIS 20 MG TABS (TADALAFIL) 1po once daily as needed  #5 x 8   Entered by:   Margaret Pyle, CMA   Authorized by:   [name]   Signed by:   Margaret Pyle, CMA on 11/28/2009   Method used:   [facility]        [facility]  [address] [phone]* (retail)       [address]       [place], [place]  [address]       Ph: [phone] or [phone]       Fax: [phone]   RxID:   [id number] OXYBUTYNIN CHLORIDE 5 MG  XR24H-TAB (OXYBUTYNIN CHLORIDE) 1 by mouth once daily  #30 x 8   Entered by:   Margaret Pyle, CMA   Authorized by:   [name]   Signed by:   Margaret Pyle, CMA on 11/28/2009   Method used:   [facility] to        [facility]  [address] [phone][facility] (retail)       [address]       [place], [place]  [address]       Ph: [phone] or [phone]       Fax: [phone]   RxID:   [id number] DIOVAN HCT 320-12.5 MG  TABS (VALSARTAN-HYDROCHLOROTHIAZIDE) 1 by mouth once daily  #30 x 8   Entered by:   Margaret Pyle, CMA   Authorized by:   [name]   Signed by:   Margaret Pyle, CMA on 11/28/2009   Method used:   [facility] to        [facility]  [address] [phone]* (retail)       [address]       [place], [place]  [address]       Ph: [phone] or [phone]       Fax: [phone]   RxID:   [id number] SIMVASTATIN 40 MG  TABS (SIMVASTATIN) 1 by mouth once daily  #30 x 8   Entered by:   Margaret Pyle, CMA  Authorized by:   [name]   Signed by:   Margaret Pyle, CMA on 11/28/2009   Method used:   [facility]ronically to        [facility]  [address] [phone]* (retail)       [address]       [place], [place]  [address]       Ph: [phone] or [phone]       Fax: [phone]   RxID:   [id number] CARVEDILOL 6.25 MG  TABS (CARVEDILOL) Take 1 tablet by mouth two times a day  #60 x 8   Entered by:   Margaret Pyle, CMA   Authorized by:   [name]   Signed by:   Margaret Pyle, CMA on 11/28/2009   Method used:   [facility]ronically to  [facility]  [address] [phone]* (retail)       [address]       [place], [place]  [address]       Ph: [phone] or [phone]       Fax: [phone]   RxID:   [id number]

## 2010-09-19 NOTE — Progress Notes (Signed)
Summary: referral  Phone Note From Other Clinic   Caller: Referral Coordinator Summary of Call: Office of Dr. Dorinda Hill Digsby [phone] called requesting referral for this pt to follow up on his Glucoma. Pt has appt scheduled 11/30 but did not previously need referral, recent change of Insurance. Initial call taken by: Margaret Pyle, CMA,  July 08, 2010 11:13 AM  Follow-up for Phone Call        sure - done per emr Follow-up by: [name],  July 08, 2010 1:01 PM

## 2010-09-19 NOTE — Progress Notes (Signed)
Summary: REFILL/PT IS OUT MEDICATION   Phone Note Refill Request Call back at Home Phone [phone] Message from:  Patient on November 28, 2009 9:18 AM  Refills Requested: Medication #1:  DIOVAN HCT 320-12.5 MG  TABS 1 by mouth once daily [phone] OR STORE # [phone]  Initial call taken by: Judie Grieve,  November 28, 2009 9:19 AM    Prescriptions: DIOVAN HCT 320-12.5 MG  TABS (VALSARTAN-HYDROCHLOROTHIAZIDE) 1 by mouth once daily  #90 x 3   Entered by:   Danielle Rankin, CMA   Authorized by:   Gaylord Shih, MD, [facility]   Signed by:   Danielle Rankin, CMA on 11/28/2009   Method used:   [facility]       [facility]       [address]       [place], [place]  [address]       Ph: [phone]       Fax: [phone]   RxID:   [id number]

## 2010-09-19 NOTE — Assessment & Plan Note (Signed)
Summary: rov. surgical clearance- hernia . pt has medicare, [employer]...      Allergies Added: NKDA  Visit Type:  surg clearance Primary Provider:  [name]  CC:  pt is here today for surg clearance for inguinal hernia....edema/right leg....denies any sob or cp.  History of Present Illness: [name] comes in today for preoperative clearance for left inguinal hernia repair. He has stable exertional angina that only occurs when he pushed himself really hard. He is stable Myoview about a year and a half ago. His ejection fraction is around 40% he denies orthopnea, PND or edema.  He pulled his right calf muscle it has been swelling there. Primary care ordered a Doppler which was negative. He is scheduled to go to [facility] to have this further evaluated. He is able to walk and it is not hurting.  ECG today is stable.  Clinical Reports Reviewed:  Cardiac Cath:  05/22/1998: Cardiac Cath Findings:  Assessment: Mr. Brooks is a 70 year old gentleman with severe coronary artery disease and moderate left ventricular dysfunction. The patient has mild peri-infarction ischemia in the inferior lateral territory, which is supplied by the distal right coronary artery, as well as circumflex. The vein graft to the distal circumflex is occluded and cannot be approached percutaneously. In addition, there is no significant stenosis of the distal right coronary artery, while the distal marginal branch of the left circumflex artery is a small vessel that does not appear to extend unto the area of ischemia. The proximal left anterior descending cornary artery exhibits some restenosis in the area of the previous intervention; however, this does not appear to be clinically significant, and did not contribute to ischemia on the Cardiolite study. PLAN: At this point, further medical management is warranted, with aggressive risk factor modification and treatment of left ventricular dysfunction. We will start Accupril at  10mg  q.d. and titrate aas tolerated. Hopefully the LAD narrowing will stablize over time; however, we will follow this with periodic stress test.  Veneda Melter, MD   11/07/1997: Cardiac Cath Findings:  [facility]: Conclusions: 1. Successful percutaneous rotational atherectomy and stent of the left anterior desccending artery with a previous failed left internal mammary vessel. 2. Successful percutaneous angioplasty of a previously bypassed obtuse marginal branch with graft faliure 3. Successful percutaneous angioplasty of the graft insertion to the diagonal branch.  Disposition: The patient had been treated with Plavix and ReoPro. We will get a sheath out later today.  Shawnie Pons, MF, [facility]  Nuclear Study:  03/05/2009:  Excerise capacity: Adenosine study with no exercise  Blood Pressure response: Normal blood pressure response  Clinical symptoms: Mild chest pain/dyspnea  ECG impression: No significant ST segment change suggestive of ischemia  Overall impression: Low risk nuclear study  Overall Impression Comments: Later wall infarct at apex. Inferolateral wall infarct at mid and basal level. Anterolateral wall infarct at mid and basal level with mid peri-infarct ischemia. Findings consistant with multi vessel disease. Findings fairly similar to 12/2006.  Colon Branch, MD, [facility]    12/30/2006:  Excerise capacity: Adenosine study with no exercise  Blood Pressure response: Normal blood pressure response  Clinical symptoms: Chest pain  ECG impression: No significant ST segment change suggestive of ischemia  Overall impression: Consistant with ischemic DCM. large anterolateral and inferolateral infarcts.  Noralyn Pick. Eden Emms, MD   01/08/2005:  Final Interpretation: Abnormal adenosine Myoview with no diagnostic electrocardiographic changes. The scintigraphic results show a large prior inferior and lateral infarct but  there was no ischemia on this study.  The gated ejection fraction was 42% and there was akinesis of the lateral wall and hypokinesis of the inferior wall.  Madolyn Frieze Jens Som, MD, [facility]   Current Medications (verified): 1)  Centrum   Tabs (Multiple Vitamins-Minerals) .... Take 1 Tablet By Mouth Once A Day 2)  Bayer Aspirin Ec Low Dose 81 Mg  Tbec (Aspirin) .... Take 1 Tablet By Mouth Once A Day 3)  Carvedilol 6.25 Mg  Tabs (Carvedilol) .... Take 1 Tablet By Mouth Two Times A Day 4)  Simvastatin 40 Mg  Tabs (Simvastatin) .Marland Kitchen.. 1 By Mouth Once Daily 5)  Diovan Hct 320-25 Mg Tabs (Valsartan-Hydrochlorothiazide) .Marland Kitchen.. 1 By Mouth Once Daily 6)  Lyrica 50 Mg Caps (Pregabalin) .Marland Kitchen.. 1 Tab At Bedtime 7)  Cialis 20 Mg Tabs (Tadalafil) .Marland Kitchen.. 1po Once Daily As Needed 8)  Nitrostat 0.4 Mg Subl (Nitroglycerin) .Marland Kitchen.. 1 Tablet Under Tongue At Onset of Chest Pain; You May Repeat Every 5 Minutes For Up To 3 Doses. 9)  Spironolactone 25 Mg Tabs (Spironolactone) .... Take 1 Tablet Daily 10)  Tramadol Hcl 50 Mg Tabs (Tramadol Hcl) .Marland Kitchen.. 1 By Mouth Q 6 Hrs As Needed Pain  Allergies (verified): No Known Drug Allergies  Past History:  Past Medical History: Last updated: 08/16/2009 CAD, ARTERY BYPASS GRAFT (ICD-414.04) MYOCARDIAL INFARCTION, HX OF (ICD-412) CONGESTIVE HEART FAILURE (ICD-428.0) HYPERLIPIDEMIA (ICD-272.4) HYPERTENSION (ICD-401.9) FATIGUE (ICD-780.79) COPD (ICD-496) PSA, INCREASED (ICD-790.93) ERECTILE DYSFUNCTION, ORGANIC (ICD-607.84) PREVENTIVE HEALTH CARE (ICD-V70.0) GLUCOSE INTOLERANCE (ICD-271.3) GLAUCOMA (ICD-365.9) VISION IMPAIR BOTH EYES IMPAIR LEVEL NFS (ICD-369.20) POSTHERPETIC NEURALGIA (ICD-053.19) DYSURIA (ICD-788.1) SHINGLES (ICD-053.9) ALLERGIC RHINITIS (ICD-477.9) PSA, INCREASED (ICD-790.93) SPECIAL SCREENING MALIGNANT NEOPLASM OF PROSTATE (ICD-V76.44) FREQUENCY, URINARY (ICD-788.41) SKIN CANCER, HX OF (ICD-V10.83) BENIGN PROSTATIC HYPERTROPHY (ICD-600.00) ANEMIA-IRON DEFICIENCY (ICD-280.9) ALCOHOL ABUSE, HX  OF (ICD-V11.3) CROHN'S DISEASE (ICD-555.9)    Past Surgical History: Last updated: 02/08/2009 Coronary artery bypass graft... 11 years ago.Marland KitchenHe has an EF of about 45%, and his last stress Myoview   showed no ischemia Dec 30, 2006.  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 Head:  normocephalic and atraumatic Eyes:  PERRLA/EOM intact; conjunctiva and lids normal. Neck:  Neck supple, no JVD. No masses, thyromegaly or abnormal cervical nodes. Chest Wall:  no deformities or breast masses noted Lungs:  Clear bilaterally to auscultation and percussion. Heart:  PMI displaced a little bit inferior laterally, regular rate and rhythm, soft systolic murmur, no gallop, no obvious carotid bruits Abdomen:  soft, good bowel sounds, no midline bruit, left inguinal hernia Msk:  Back normal, normal gait. Muscle strength and tone normal. gastrocnemius not sore tender Pulses:  pulses  normal in all 4 extremities Extremities:  No clubbing or cyanosis. Neurologic:  Alert and oriented x 3. Skin:  Intact without lesions or rashes. Psych:  Normal affect.   Impression & Recommendations:  Problem # 1:  INGUINAL HERNIA, LEFT (ICD-550.90) Assessment New I have cleared him at  low operative risk for hernia repair. Note sent to Dr. Magnus Ivan and a note given to the patient.  Problem # 2:  CALF PAIN, RIGHT (ICD-729.5) Assessment: New no sign of clot and patient has good pulses. Coincides with pull of gastrocnemius. To be evaluated further at [facility]  Problem # 3:  CAD, ARTERY BYPASS GRAFT (ICD-414.04) Assessment: Unchanged continue medical therapy His updated medication list for this problem includes:    Bayer Aspirin Ec Low Dose 81 Mg Tbec (Aspirin) .Marland Kitchen... Take 1 tablet by mouth once a day    Carvedilol 6.25 Mg Tabs (Carvedilol) .Marland Kitchen... Take 1 tablet by mouth two times a day    Nitrostat 0.4 Mg Subl (Nitroglycerin) .Marland Kitchen... 1 tablet under tongue at onset of chest pain; you may repeat every 5 minutes for up to 3 doses.  Problem # 4:  MYOCARDIAL INFARCTION, HX OF (ICD-412) Assessment: Unchanged  His updated medication list for this problem includes:    Bayer Aspirin Ec Low Dose 81 Mg Tbec (Aspirin) .Marland Kitchen... Take 1 tablet by mouth once a day    Carvedilol 6.25 Mg Tabs (Carvedilol) .Marland Kitchen... Take 1 tablet by mouth two times a day    Nitrostat 0.4 Mg Subl (Nitroglycerin) .Marland Kitchen... 1 tablet under tongue at onset of chest pain; you may repeat every 5 minutes for up to 3 doses.  Orders: EKG w/ Interpretation (93000)  Problem # 5:  CONGESTIVE HEART FAILURE (ICD-428.0) Assessment: Improved continue medical therapy His updated medication list for this problem includes:    Bayer Aspirin Ec Low Dose 81 Mg Tbec (Aspirin) .Marland Kitchen... Take 1 tablet by mouth once a day    Carvedilol 6.25 Mg Tabs (Carvedilol) .Marland Kitchen... Take 1 tablet by mouth two times a day    Diovan Hct 320-25 Mg Tabs (Valsartan-hydrochlorothiazide) .Marland Kitchen... 1 by mouth once daily    Nitrostat 0.4 Mg Subl (Nitroglycerin) .Marland Kitchen... 1 tablet under tongue at onset of chest pain; you may repeat every 5 minutes for up to 3 doses.    Spironolactone 25 Mg Tabs (Spironolactone) .Marland Kitchen... Take 1 tablet daily  Patient Instructions: 1)  Your physician  recommends that you continue on your current medications as directed. Please refer to the Current Medication list given to you today. 2)  Pt given a copy of clearance letter for surgery

## 2010-09-19 NOTE — Assessment & Plan Note (Signed)
Summary: 6 mo f/u .cy  Medications Added LYRICA 50 MG CAPS (PREGABALIN) 1 tab at bedtime NITROSTAT 0.4 MG SUBL (NITROGLYCERIN) 1 tablet under tongue at onset of chest pain; you may repeat every 5 minutes for up to 3 doses.      Allergies Added: NKDA  Visit Type:  6 mo f/u Primary Provider:  [name]  CC:  sob w/exertion at times when he is working in his yard...otherwise no other complaints today.  History of Present Illness: Gary Levy returns today for evaluation and management coronary artery disease, history of congestive heart failure, hyperlipidemia, history of previous myocardial infarction.  He's done remarkably well. He remains active without any symptoms of ischemia or angina she really pushes hard. When he stops to rest it goes away immediately. This has not changed. Stress Myoview last year was stable. Last echocardiogram showed an ejection fraction of 45%.  He denies orthopnea, PND or edema.  His weight has been stable. He is very compliant with his medications. Laboratory data in December looked good. I reviewed this with him today.  Current Medications (verified): 1)  Centrum   Tabs (Multiple Vitamins-Minerals) .... Take 1 Tablet By Mouth Once A Day 2)  Bayer Aspirin Ec Low Dose 81 Mg  Tbec (Aspirin) .... Take 1 Tablet By Mouth Once A Day 3)  Carvedilol 6.25 Mg  Tabs (Carvedilol) .... Take 1 Tablet By Mouth Two Times A Day 4)  Simvastatin 40 Mg  Tabs (Simvastatin) .Marland Kitchen.. 1 By Mouth Once Daily 5)  Diovan Hct 320-25 Mg Tabs (Valsartan-Hydrochlorothiazide) .Marland Kitchen.. 1 By Mouth Once Daily 6)  Lyrica 50 Mg Caps (Pregabalin) .Marland Kitchen.. 1 Tab At Bedtime 7)  Cialis 20 Mg Tabs (Tadalafil) .Marland Kitchen.. 1po Once Daily As Needed 8)  Nitrostat 0.4 Mg Subl (Nitroglycerin) .Marland Kitchen.. 1 Tablet Under Tongue At Onset of Chest Pain; You May Repeat Every 5 Minutes For Up To 3 Doses.  Allergies (verified): No Known Drug Allergies  Past History:  Past Medical History: Last updated: 08/16/2009 CAD, ARTERY  BYPASS GRAFT (ICD-414.04) MYOCARDIAL INFARCTION, HX OF (ICD-412) CONGESTIVE HEART FAILURE (ICD-428.0) HYPERLIPIDEMIA (ICD-272.4) HYPERTENSION (ICD-401.9) FATIGUE (ICD-780.79) COPD (ICD-496) PSA, INCREASED (ICD-790.93) ERECTILE DYSFUNCTION, ORGANIC (ICD-607.84) PREVENTIVE HEALTH CARE (ICD-V70.0) GLUCOSE INTOLERANCE (ICD-271.3) GLAUCOMA (ICD-365.9) VISION IMPAIR BOTH EYES IMPAIR LEVEL NFS (ICD-369.20) POSTHERPETIC NEURALGIA (ICD-053.19) DYSURIA (ICD-788.1) SHINGLES (ICD-053.9) ALLERGIC RHINITIS (ICD-477.9) PSA, INCREASED (ICD-790.93) SPECIAL SCREENING MALIGNANT NEOPLASM OF PROSTATE (ICD-V76.44) FREQUENCY, URINARY (ICD-788.41) SKIN CANCER, HX OF (ICD-V10.83) BENIGN PROSTATIC HYPERTROPHY (ICD-600.00) ANEMIA-IRON DEFICIENCY (ICD-280.9) ALCOHOL ABUSE, HX OF (ICD-V11.3) CROHN'S DISEASE (ICD-555.9)    Past Surgical History: Last updated: 02/08/2009 Coronary artery bypass graft... 11 years ago.Marland KitchenHe has an EF of about 45%, and his last stress Myoview   showed no ischemia Dec 30, 2006.  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 Neck:  Neck supple, no JVD. No masses, thyromegaly or abnormal cervical  nodes. Chest Nida Manfredi:  no deformities or breast masses noted Lungs:  Clear bilaterally to auscultation and percussion. Heart:  PMI nondisplaced, soft systolic murmur, regular rate and rhythm, normal S1-S2, no gallop, negative bruits Abdomen:  Bowel sounds positive; abdomen soft and non-tender without masses, organomegaly, or hernias noted. No hepatosplenomegaly. Msk:  Back normal, normal gait. Muscle strength and tone normal. Pulses:  pulses normal in all 4 extremities Extremities:  No clubbing or cyanosis. Neurologic:  Alert and oriented x 3. Skin:  Intact without lesions or rashes. Psych:  Normal affect.   Impression & Recommendations:  Problem # 1:  CAD, ARTERY BYPASS GRAFT (ICD-414.04) Stable. Continue medical therapy. We will see him back in 6 months. Stress Myoview a year if symptoms are stable. His updated medication list for this problem includes:    Bayer Aspirin Ec Low Dose 81 Mg Tbec (Aspirin) .Marland Kitchen... Take 1 tablet by mouth once a day    Carvedilol 6.25 Mg Tabs (Carvedilol) .Marland Kitchen... Take 1 tablet by mouth two times a day    Nitrostat 0.4 Mg Subl (Nitroglycerin) .Marland Kitchen... 1 tablet under tongue at onset of chest pain; you may repeat every 5 minutes for up to 3 doses.  Orders: Echocardiogram (Echo)  Problem # 2:  MYOCARDIAL INFARCTION, HX OF (ICD-412) Assessment: Unchanged Echocardiogram to assess LV function. His updated medication list for this problem includes:    Bayer Aspirin Ec Low Dose 81 Mg Tbec (Aspirin) .Marland Kitchen... Take 1 tablet by mouth once a day    Carvedilol 6.25 Mg Tabs (Carvedilol) .Marland Kitchen... Take 1 tablet by mouth two times a day    Nitrostat 0.4 Mg Subl (Nitroglycerin) .Marland Kitchen... 1 tablet under tongue at onset of chest pain; you may repeat every 5 minutes for up to 3 doses.  Orders: Echocardiogram (Echo)  Problem # 3:  HYPERLIPIDEMIA (ICD-272.4)  His updated medication list for this problem includes:    Simvastatin 40 Mg Tabs (Simvastatin) .Marland Kitchen... 1 by mouth once daily  Problem #  4:  HYPERTENSION (ICD-401.9)  His updated medication list for this problem includes:    Bayer Aspirin Ec Low Dose 81 Mg Tbec (Aspirin) .Marland Kitchen... Take 1 tablet by mouth once a day    Carvedilol 6.25 Mg Tabs (Carvedilol) .Marland Kitchen... Take 1 tablet by mouth two times a day    Diovan Hct 320-25 Mg Tabs (Valsartan-hydrochlorothiazide) .Marland Kitchen... 1 by mouth once daily  Clinical Reports Reviewed:  Cardiac Cath:  05/22/1998: Cardiac Cath Findings:  Assessment: Gary Levy is a 70 year old gentleman with severe coronary artery disease and moderate left ventricular dysfunction. The patient has mild peri-infarction ischemia in the inferior lateral territory, which is supplied by the distal right coronary artery, as well as circumflex. The vein graft to the distal circumflex is occluded and cannot be approached percutaneously. In addition, there is no significant stenosis of the distal right coronary artery, while the distal marginal branch of the left circumflex artery is a small vessel that does not appear to extend unto the area of ischemia. The proximal left anterior descending cornary artery exhibits some restenosis in the area of the previous intervention; however, this does not appear to be clinically significant, and did not contribute to ischemia on the Cardiolite study. PLAN: At this point,  further medical management is warranted, with aggressive risk factor modification and treatment of left ventricular dysfunction. We will start Accupril at 10mg  q.d. and titrate aas tolerated. Hopefully the LAD narrowing will stablize over time; however, we will follow this with periodic stress test.  Veneda Melter, MD   11/07/1997: Cardiac Cath Findings:  [facility]: Conclusions: 1. Successful percutaneous rotational atherectomy and stent of the left anterior desccending artery with a previous failed left internal mammary vessel. 2. Successful percutaneous angioplasty of a previously bypassed obtuse marginal  branch with graft faliure 3. Successful percutaneous angioplasty of the graft insertion to the diagonal branch.  Disposition: The patient had been treated with Plavix and ReoPro. We will get a sheath out later today.  Shawnie Pons, MF, [facility]  Nuclear Study:  03/05/2009:  Excerise capacity: Adenosine study with no exercise  Blood Pressure response: Normal blood pressure response  Clinical symptoms: Mild chest pain/dyspnea  ECG impression: No significant ST segment change suggestive of ischemia  Overall impression: Low risk nuclear study  Overall Impression Comments: Later Arnisha Laffoon infarct at apex. Inferolateral Martika Egler infarct at mid and basal level. Anterolateral [place] infarct at mid and basal level with mid peri-infarct ischemia. Findings consistant with multi vessel disease. Findings fairly similar to 12/2006.  Colon Branch, MD, [facility]    12/30/2006:  Excerise capacity: Adenosine study with no exercise  Blood Pressure response: Normal blood pressure response  Clinical symptoms: Chest pain  ECG impression: No significant ST segment change suggestive of ischemia  Overall impression: Consistant with ischemic DCM. large anterolateral and inferolateral infarcts.  Noralyn Pick. Eden Emms, MD   01/08/2005:  Final Interpretation: Abnormal adenosine Myoview with no diagnostic electrocardiographic changes. The scintigraphic results show a large prior inferior and lateral infarct but there was no ischemia on this study. The gated ejection fraction was 42% and there was akinesis of the lateral Gayathri Futrell and hypokinesis of the inferior Gita Dilger.  Madolyn Frieze Jens Som, MD, [facility]   Patient Instructions: 1)  Your physician recommends that you schedule a follow-up appointment in: 6 MONTHS WITH DR Kaylianna Detert 2)  Your physician recommends that you continue on your current medications as directed. Please refer to the Current Medication list given to you today. 3)  Your physician has requested that you have an  echocardiogram.  Echocardiography is a painless test that uses sound waves to create images of your heart. It provides your doctor with information about the size and shape of your heart and how well your heart's chambers and valves are working.  This procedure takes approximately one hour. There are no restrictions for this procedure. Prescriptions: NITROSTAT 0.4 MG SUBL (NITROGLYCERIN) 1 tablet under tongue at onset of chest pain; you may repeat every 5 minutes for up to 3 doses.  #25 x 9   Entered by:   Danielle Rankin, CMA   Authorized by:   Gaylord Shih, MD, [facility]   Signed by:   Danielle Rankin, CMA on 03/14/2010   Method used:   Faxed to ...       [facility]       [address]       [place], [place]  [address]       Ph: [phone]       Fax: [phone]   RxID:   [id number]

## 2010-09-19 NOTE — Progress Notes (Signed)
----   Converted from flag ---- ---- 07/29/2010 9:54 AM, [name] wrote: noted  ---- 07/29/2010 9:54 AM, Verdell Face wrote: FYI this pt cx'd appt at [facility] w/orthopedic --- just to let you know. ------------------------------

## 2010-09-19 NOTE — Assessment & Plan Note (Signed)
Summary: CPX /NWS   Vital Signs:  Patient profile:   70 year old male Height:      71 inches Weight:      165.25 pounds BMI:     23.13 O2 Sat:      98 % on Room air Temp:     97.8 degrees F oral Pulse rate:   64 / minute BP sitting:   132 / 72  (left arm) Cuff size:   regular  Vitals Entered By: Zella Ball Ewing CMA Duncan Dull) (August 28, 2010 8:48 AM)  O2 Flow:  Room air  CC: ADult Physical/RE   Primary Care Provider:  [name]  CC:  ADult Physical/RE.  History of Present Illness: here for wellness, overall doing ok;  Pt denies CP, worsening sob, doe, wheezing, orthopnea, pnd, worsening LE edema, palps, dizziness or syncope  Pt denies new neuro symptoms such as headache, facial or extremity weakness  Pt denies polydipsia, polyuria  Overall good compliance with meds, trying to follow low chol diet, wt stable, little excercise however  Has quite good attitude today and states felt the best he has felt in some yrs.  No fever, wt loss, night sweats, loss of appetite or other constitutional symptoms  Overall good compliance with meds, and good tolerability.  Denies worsening depressive symptoms, suicidal ideation, or panic.   Pt states good ability with ADL's, low fall risk, home safety reviewed and adequate, no significant change in hearing or vision, trying to follow lower chol diet, and occasionally active only with regular excercise.   Problems Prior to Update: 1)  Inguinal Hernia, Left  (ICD-550.90) 2)  Calf Pain, Right  (ICD-729.5) 3)  Edema  (ICD-782.3) 4)  Cad, Artery Bypass Graft  (ICD-414.04) 5)  Myocardial Infarction, Hx of  (ICD-412) 6)  Congestive Heart Failure  (ICD-428.0) 7)  Hyperlipidemia  (ICD-272.4) 8)  Hypertension  (ICD-401.9) 9)  Fatigue  (ICD-780.79) 10)  COPD  (ICD-496) 11)  Psa, Increased  (ICD-790.93) 12)  Erectile Dysfunction, Organic  (ICD-607.84) 13)  Preventive Health Care  (ICD-V70.0) 14)  Glucose Intolerance  (ICD-271.3) 15)  Glaucoma   (ICD-365.9) 16)  Vision Impair Both Eyes Impair Level Nfs  (ICD-369.20) 17)  Postherpetic Neuralgia  (ICD-053.19) 18)  Dysuria  (ICD-788.1) 19)  Shingles  (ICD-053.9) 20)  Allergic Rhinitis  (ICD-477.9) 21)  Psa, Increased  (ICD-790.93) 22)  Special Screening Malignant Neoplasm of Prostate  (ICD-V76.44) 23)  Frequency, Urinary  (ICD-788.41) 24)  Skin Cancer, Hx of  (ICD-V10.83) 25)  Benign Prostatic Hypertrophy  (ICD-600.00) 26)  Anemia-iron Deficiency  (ICD-280.9) 27)  Alcohol Abuse, Hx of  (ICD-V11.3)  Medications Prior to Update: 1)  Centrum   Tabs (Multiple Vitamins-Minerals) .... Take 1 Tablet By Mouth Once A Day 2)  Bayer Aspirin Ec Low Dose 81 Mg  Tbec (Aspirin) .... Take 1 Tablet By Mouth Once A Day 3)  Carvedilol 6.25 Mg  Tabs (Carvedilol) .... Take 1 Tablet By Mouth Two Times A Day 4)  Simvastatin 40 Mg  Tabs (Simvastatin) .Marland Kitchen.. 1 By Mouth Once Daily 5)  Diovan Hct 320-25 Mg Tabs (Valsartan-Hydrochlorothiazide) .Marland Kitchen.. 1 By Mouth Once Daily 6)  Lyrica 50 Mg Caps (Pregabalin) .Marland Kitchen.. 1 Tab At Bedtime 7)  Cialis 20 Mg Tabs (Tadalafil) .Marland Kitchen.. 1po Once Daily As Needed 8)  Nitrostat 0.4 Mg Subl (Nitroglycerin) .Marland Kitchen.. 1 Tablet Under Tongue At Onset of Chest Pain; You May Repeat Every 5 Minutes For Up To 3 Doses. 9)  Spironolactone 25 Mg Tabs (Spironolactone) .Marland KitchenMarland KitchenMarland Kitchen  Take 1 Tablet Daily 10)  Tramadol Hcl 50 Mg Tabs (Tramadol Hcl) .Marland Kitchen.. 1 By Mouth Q 6 Hrs As Needed Pain  Current Medications (verified): 1)  Centrum   Tabs (Multiple Vitamins-Minerals) .... Take 1 Tablet By Mouth Once A Day 2)  Bayer Aspirin Ec Low Dose 81 Mg  Tbec (Aspirin) .... Take 1 Tablet By Mouth Once A Day 3)  Carvedilol 6.25 Mg  Tabs (Carvedilol) .... Take 1 Tablet By Mouth Two Times A Day 4)  Simvastatin 40 Mg  Tabs (Simvastatin) .Marland Kitchen.. 1 By Mouth Once Daily 5)  Diovan Hct 320-25 Mg Tabs (Valsartan-Hydrochlorothiazide) .Marland Kitchen.. 1 By Mouth Once Daily 6)  Lyrica 50 Mg Caps (Pregabalin) .Marland Kitchen.. 1 Tab At Bedtime 7)  Cialis 20 Mg Tabs  (Tadalafil) .Marland Kitchen.. 1po Once Daily As Needed 8)  Nitrostat 0.4 Mg Subl (Nitroglycerin) .Marland Kitchen.. 1 Tablet Under Tongue At Onset of Chest Pain; You May Repeat Every 5 Minutes For Up To 3 Doses. 9)  Spironolactone 25 Mg Tabs (Spironolactone) .... Take 1 Tablet Daily 10)  Tramadol Hcl 50 Mg Tabs (Tramadol Hcl) .Marland Kitchen.. 1 By Mouth Q 6 Hrs As Needed Pain  Allergies (verified): No Known Drug Allergies  Past History:  Past Medical History: Last updated: 08/16/2009 CAD, ARTERY BYPASS GRAFT (ICD-414.04) MYOCARDIAL INFARCTION, HX OF (ICD-412) CONGESTIVE HEART FAILURE (ICD-428.0) HYPERLIPIDEMIA (ICD-272.4) HYPERTENSION (ICD-401.9) FATIGUE (ICD-780.79) COPD (ICD-496) PSA, INCREASED (ICD-790.93) ERECTILE DYSFUNCTION, ORGANIC (ICD-607.84) PREVENTIVE HEALTH CARE (ICD-V70.0) GLUCOSE INTOLERANCE (ICD-271.3) GLAUCOMA (ICD-365.9) VISION IMPAIR BOTH EYES IMPAIR LEVEL NFS (ICD-369.20) POSTHERPETIC NEURALGIA (ICD-053.19) DYSURIA (ICD-788.1) SHINGLES (ICD-053.9) ALLERGIC RHINITIS (ICD-477.9) PSA, INCREASED (ICD-790.93) SPECIAL SCREENING MALIGNANT NEOPLASM OF PROSTATE (ICD-V76.44) FREQUENCY, URINARY (ICD-788.41) SKIN CANCER, HX OF (ICD-V10.83) BENIGN PROSTATIC HYPERTROPHY (ICD-600.00) ANEMIA-IRON DEFICIENCY (ICD-280.9) ALCOHOL ABUSE, HX OF (ICD-V11.3) CROHN'S DISEASE (ICD-555.9)    Family History: Last updated: 02/08/2009 Mother: heart dz  Social History: Last updated: 02/08/2009 Former Smoker Alcohol use-no 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 11 years ago.Marland KitchenHe has an EF of about 45%, and his last stress Myoview   showed no ischemia Dec 30, 2006.  s/p PTCA s/p skin cancer - non-melanoma s/p right cataract dec 2011 with lens implant  s/o [facility]  - Dr Rayburn Ma 2011  Review of Systems  The patient denies anorexia, fever,  vision loss, decreased hearing, hoarseness, chest pain, syncope, dyspnea on exertion, peripheral edema, prolonged cough, headaches, hemoptysis, abdominal pain, melena, hematochezia, severe indigestion/heartburn, hematuria, muscle weakness, suspicious skin lesions, transient blindness, difficulty walking, depression, unusual weight change, abnormal bleeding, enlarged lymph nodes, and angioedema.         all otherwise negative per pt -  - due for left cataract jan 2012 - Dr Hazle Quant  Physical Exam  General:  alert and well-developed.   Head:  normocephalic and atraumatic.   Eyes:  vision grossly intact, pupils equal, and pupils round.   Ears:  R ear normal and L ear normal.   Nose:  no external deformity and no nasal discharge.   Mouth:  no gingival abnormalities and pharynx pink and moist.   Neck:  supple and no masses.   Lungs:  normal respiratory effort and normal breath sounds.   Heart:  normal rate and regular rhythm.   Abdomen:  soft, non-tender, and normal bowel sounds.   Msk:  no joint tenderness and no joint swelling.   Extremities:  no edema, no  erythema  Neurologic:  strength normal in all lower extremities and sensation intact to light touch.     Impression & Recommendations:  Problem # 1:  Preventive Health Care (ICD-V70.0) Overall doing well, age appropriate education and counseling updated, referral for preventive services and immunizations addressed, dietary counseling and smoking status adressed , most recent labs reviewed I have personally reviewed and have noted 1.The patient's medical and social history 2.Their use of alcohol, tobacco or illicit drugs 3.Their current medications and supplements 4. Functional ability including ADL's, fall risk, home safety risk, hearing & visual impairment 5.Diet and physical activities 6.Evidence for depression or mood disorders The patients weight, height, BMI  have been recorded in the chart I have made referrals, counseling and  provided education to the patient based review of the above   Problem # 2:  HYPERTENSION (ICD-401.9)  His updated medication list for this problem includes:    Carvedilol 6.25 Mg Tabs (Carvedilol) .Marland Kitchen... Take 1 tablet by mouth two times a day    Diovan Hct 320-25 Mg Tabs (Valsartan-hydrochlorothiazide) .Marland Kitchen... 1 by mouth once daily    Spironolactone 25 Mg Tabs (Spironolactone) .Marland Kitchen... Take 1 tablet daily  BP today: 132/72 Prior BP: 126/80 (07/04/2010)  Labs Reviewed: K+: 5.1 (08/26/2010) Creat: : 1.3 (08/26/2010)   Chol: 146 (08/26/2010)   HDL: 41.40 (08/26/2010)   LDL: 95 (08/26/2010)   TG: 47.0 (08/26/2010) stable overall by hx and exam, ok to continue meds/tx as is   Problem # 3:  HYPERLIPIDEMIA (ICD-272.4)  His updated medication list for this problem includes:    Simvastatin 40 Mg Tabs (Simvastatin) .Marland Kitchen... 1 by mouth once daily  Labs Reviewed: SGOT: 25 (08/26/2010)   SGPT: 22 (08/26/2010)   HDL:41.40 (08/26/2010), 48.30 (08/15/2009)  LDL:95 (08/26/2010), 87 (08/15/2009)  Chol:146 (08/26/2010), 147 (08/15/2009)  Trig:47.0 (08/26/2010), 61.0 (08/15/2009) stable overall by hx and exam, ok to continue meds/tx as is   Complete Medication List: 1)  Centrum Tabs (Multiple vitamins-minerals) .... Take 1 tablet by mouth once a day 2)  Bayer Aspirin Ec Low Dose 81 Mg Tbec (Aspirin) .... Take 1 tablet by mouth once a day 3)  Carvedilol 6.25 Mg Tabs (Carvedilol) .... Take 1 tablet by mouth two times a day 4)  Simvastatin 40 Mg Tabs (Simvastatin) .Marland Kitchen.. 1 by mouth once daily 5)  Diovan Hct 320-25 Mg Tabs (Valsartan-hydrochlorothiazide) .Marland Kitchen.. 1 by mouth once daily 6)  Lyrica 50 Mg Caps (Pregabalin) .Marland Kitchen.. 1 tab at bedtime 7)  Cialis 20 Mg Tabs (Tadalafil) .Marland Kitchen.. 1po once daily as needed 8)  Nitrostat 0.4 Mg Subl (Nitroglycerin) .Marland Kitchen.. 1 tablet under tongue at onset of chest pain; you may repeat every 5 minutes for up to 3 doses. 9)  Spironolactone 25 Mg Tabs (Spironolactone) .... Take 1 tablet daily 10)   Tramadol Hcl 50 Mg Tabs (Tramadol hcl) .Marland Kitchen.. 1 by mouth q 6 hrs as needed pain  Patient Instructions: 1)  Continue all previous medications as before this visit 2)  Please schedule a follow-up appointment in 1 year, or sooner if needed   Orders Added: 1)  Est. Patient 65& > [[id number]]

## 2010-09-19 NOTE — Assessment & Plan Note (Signed)
Summary: FU/ UROLOGY IS TO FAX NOTES /NWS   Vital Signs:  Patient profile:   70 year old male Height:      71 inches Weight:      170 pounds BMI:     23.80 O2 Sat:      98 % on Room air Temp:     96.5 degrees F oral Pulse rate:   57 / minute BP sitting:   144 / 84  (left arm) Cuff size:   regular  Vitals Entered ByZella Ball Ewing (Jan 15, 2010 9:25 AM)  O2 Flow:  Room air  CC: followup urology/RE   Primary Care Provider:  [name]  CC:  followup urology/RE.  History of Present Illness: overall doing well , no specific complaints,  has some chronic trace edema bilat LE's but also s/p vein harvest bilat as well;  recent BP at dentist was 180 sbp per pt but believes he was nervous about being there;  Pt denies CP, sob, doe, wheezing, orthopnea, pnd, worsening LE edema, palps, dizziness or syncope   Pt denies new neuro symptoms such as headache, facial or extremity weakness   Overall excellent med complaince and tolerance , such as no constipation or dry mouth with the oxybutinin,  or myalgias with the statin.  Recent prostate biopsy neg  for malignancy - has f/u in 6 mo.  No new complaints.   Problems Prior to Update: 1)  Cad, Artery Bypass Graft  (ICD-414.04) 2)  Myocardial Infarction, Hx of  (ICD-412) 3)  Congestive Heart Failure  (ICD-428.0) 4)  Hyperlipidemia  (ICD-272.4) 5)  Hypertension  (ICD-401.9) 6)  Fatigue  (ICD-780.79) 7)  COPD  (ICD-496) 8)  Psa, Increased  (ICD-790.93) 9)  Erectile Dysfunction, Organic  (ICD-607.84) 10)  Preventive Health Care  (ICD-V70.0) 11)  Glucose Intolerance  (ICD-271.3) 12)  Glaucoma  (ICD-365.9) 13)  Vision Impair Both Eyes Impair Level Nfs  (ICD-369.20) 14)  Postherpetic Neuralgia  (ICD-053.19) 15)  Dysuria  (ICD-788.1) 16)  Shingles  (ICD-053.9) 17)  Allergic Rhinitis  (ICD-477.9) 18)  Psa, Increased  (ICD-790.93) 19)  Special Screening Malignant Neoplasm of Prostate  (ICD-V76.44) 20)  Frequency, Urinary  (ICD-788.41) 21)   Skin Cancer, Hx of  (ICD-V10.83) 22)  Benign Prostatic Hypertrophy  (ICD-600.00) 23)  Anemia-iron Deficiency  (ICD-280.9) 24)  Alcohol Abuse, Hx of  (ICD-V11.3) 25)  Crohn's Disease  (ICD-555.9)  Medications Prior to Update: 1)  Centrum   Tabs (Multiple Vitamins-Minerals) .... Take 1 Tablet By Mouth Once A Day 2)  Bayer Aspirin Ec Low Dose 81 Mg  Tbec (Aspirin) .... Take 1 Tablet By Mouth Once A Day 3)  Carvedilol 6.25 Mg  Tabs (Carvedilol) .... Take 1 Tablet By Mouth Two Times A Day 4)  Simvastatin 40 Mg  Tabs (Simvastatin) .Marland Kitchen.. 1 By Mouth Once Daily 5)  Diovan Hct 320-12.5 Mg  Tabs (Valsartan-Hydrochlorothiazide) .Marland Kitchen.. 1 By Mouth Once Daily 6)  Oxybutynin Chloride 5 Mg  Xr24h-Tab (Oxybutynin Chloride) .Marland Kitchen.. 1 By Mouth Once Daily 7)  Lyrica 50 Mg Caps (Pregabalin) .... Out of Med [name] Will Call Dr. Jonny Ruiz For Refill 8)  Cialis 20 Mg Tabs (Tadalafil) .Marland Kitchen.. 1po Once Daily As Needed  Current Medications (verified): 1)  Centrum   Tabs (Multiple Vitamins-Minerals) .... Take 1 Tablet By Mouth Once A Day 2)  Bayer Aspirin Ec Low Dose 81 Mg  Tbec (Aspirin) .... Take 1 Tablet By Mouth Once A Day 3)  Carvedilol 6.25 Mg  Tabs (Carvedilol) .Marland KitchenMarland KitchenMarland Kitchen  Take 1 Tablet By Mouth Two Times A Day 4)  Simvastatin 40 Mg  Tabs (Simvastatin) .Marland Kitchen.. 1 By Mouth Once Daily 5)  Diovan Hct 320-25 Mg Tabs (Valsartan-Hydrochlorothiazide) .Marland Kitchen.. 1 By Mouth Once Daily 6)  Oxybutynin Chloride 5 Mg  Xr24h-Tab (Oxybutynin Chloride) .Marland Kitchen.. 1 By Mouth Once Daily 7)  Lyrica 50 Mg Caps (Pregabalin) .... Out of Med [name] Will Call Dr. Jonny Ruiz For Refill 8)  Cialis 20 Mg Tabs (Tadalafil) .Marland Kitchen.. 1po Once Daily As Needed  Allergies (verified): No Known Drug Allergies  Past History:  Past Surgical History: Last updated: 02/08/2009 Coronary artery bypass graft... 11 years ago.Marland KitchenHe has an EF of about 45%, and his last stress Myoview   showed no ischemia Dec 30, 2006.  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 CAD, ARTERY BYPASS GRAFT (ICD-414.04) MYOCARDIAL INFARCTION, HX OF (ICD-412) CONGESTIVE HEART FAILURE (ICD-428.0) HYPERLIPIDEMIA (ICD-272.4) HYPERTENSION (ICD-401.9) FATIGUE (ICD-780.79) COPD (ICD-496) PSA, INCREASED (ICD-790.93) ERECTILE DYSFUNCTION, ORGANIC (ICD-607.84) PREVENTIVE HEALTH CARE (ICD-V70.0) GLUCOSE INTOLERANCE (ICD-271.3) GLAUCOMA (ICD-365.9) VISION IMPAIR BOTH EYES IMPAIR LEVEL NFS (ICD-369.20) POSTHERPETIC NEURALGIA (ICD-053.19) DYSURIA (ICD-788.1) SHINGLES (ICD-053.9) ALLERGIC RHINITIS (ICD-477.9) PSA, INCREASED (ICD-790.93) SPECIAL SCREENING MALIGNANT NEOPLASM OF PROSTATE (ICD-V76.44) FREQUENCY, URINARY (ICD-788.41) SKIN CANCER, HX OF (ICD-V10.83) BENIGN PROSTATIC HYPERTROPHY (ICD-600.00) ANEMIA-IRON DEFICIENCY (ICD-280.9) ALCOHOL ABUSE, HX OF (ICD-V11.3) CROHN'S DISEASE (ICD-555.9)    Social History: Reviewed history from 02/08/2009 and no changes required. 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   Head:  normocephalic and atraumatic.   Eyes:  vision grossly intact, pupils equal, and pupils round.   Ears:  R ear normal and L ear normal.   Nose:  no external deformity and no nasal discharge.   Mouth:  no gingival abnormalities and pharynx pink and moist.   Neck:  supple and no masses.   Lungs:  normal respiratory effort and normal breath sounds.   Heart:  normal rate and regular rhythm.   Extremities:  no edema, no erythema    Impression & Recommendations:  Problem # 1:   HYPERTENSION (ICD-401.9)  His updated medication list for this problem includes:    Carvedilol 6.25 Mg Tabs (Carvedilol) .Marland Kitchen... Take 1 tablet by mouth two times a day    Diovan Hct 320-25 Mg Tabs (Valsartan-hydrochlorothiazide) .Marland Kitchen... 1 by mouth once daily  BP today: 144/84 Prior BP: 126/70 (08/23/2009)  Labs Reviewed: K+: 4.6 (08/15/2009) Creat: : 1.3 (08/15/2009)   Chol: 147 (08/15/2009)   HDL: 48.30 (08/15/2009)   LDL: 87 (08/15/2009)   TG: 61.0 (08/15/2009) uncontrolled - to adjust the diovan hct as above  Problem # 2:  EDEMA (ICD-782.3)  His updated medication list for this problem includes:    Diovan Hct 320-25 Mg Tabs (Valsartan-hydrochlorothiazide) .Marland Kitchen... 1 by mouth once daily trace bilat - above may help with this as well  Problem # 3:  HYPERLIPIDEMIA (ICD-272.4)  His updated medication list for this problem includes:    Simvastatin 40 Mg Tabs (Simvastatin) .Marland Kitchen... 1 by mouth once daily  Labs Reviewed: SGOT: 28 (08/15/2009)   SGPT:  24 (08/15/2009)   HDL:48.30 (08/15/2009), 40.6 (04/05/2008)  LDL:87 (08/15/2009), 80 (04/05/2008)  Chol:147 (08/15/2009), 128 (04/05/2008)  Trig:61.0 (08/15/2009), 37 (04/05/2008) stable overall by hx and exam, ok to continue meds/tx as is - goal ldl less than 70 - d/w pt - to consider change to generic lipitor when generic next visit, cont diet efforts  Complete Medication List: 1)  Centrum Tabs (Multiple vitamins-minerals) .... Take 1 tablet by mouth once a day 2)  Bayer Aspirin Ec Low Dose 81 Mg Tbec (Aspirin) .... Take 1 tablet by mouth once a day 3)  Carvedilol 6.25 Mg Tabs (Carvedilol) .... Take 1 tablet by mouth two times a day 4)  Simvastatin 40 Mg Tabs (Simvastatin) .Marland Kitchen.. 1 by mouth once daily 5)  Diovan Hct 320-25 Mg Tabs (Valsartan-hydrochlorothiazide) .Marland Kitchen.. 1 by mouth once daily 6)  Oxybutynin Chloride 5 Mg Xr24h-tab (Oxybutynin chloride) .Marland Kitchen.. 1 by mouth once daily 7)  Lyrica 50 Mg Caps (Pregabalin) .... Out of med Jeremiah Benally will  call dr. Jonny Ruiz for refill 8)  Cialis 20 Mg Tabs (Tadalafil) .Marland Kitchen.. 1po once daily as needed  Patient Instructions: 1)  Continue all previous medications as before this visit , except increase the diovan HCT to 320/25 mg - 1 per day 2)  Please schedule a follow-up appointment in 6 months with CPX labs prior Prescriptions: DIOVAN HCT 320-25 MG TABS (VALSARTAN-HYDROCHLOROTHIAZIDE) 1 by mouth once daily  #30 x 11   Entered and Authorized by:   [name]   Signed by:   [name] on 01/15/2010   Method used:   Print then Give to Patient   RxID:   [id number]

## 2010-09-19 NOTE — Letter (Signed)
Summary: [facility]   Imported By: Lester Isola 08/23/2010 07:29:11  _____________________________________________________________________  External Attachment:    Type:   Image     Comment:   External Document

## 2010-09-20 ENCOUNTER — Encounter: Payer: Self-pay | Admitting: Cardiology

## 2010-09-25 NOTE — Letter (Addendum)
Summary: Lab Test Reminder  [facility], Main Office  [address]   [place], [place] [address]   Phone: [phone]  Fax: [phone]     September 20, 2010 MRN: [id number]   ZEVEN KOCAK [address] [place], [place]  [address]   Dear Mr. DECOSTE,  During your last appointment, Dr. Daleen Squibb  requested you have some follow-up blood work.   REMEMBER DO NOT EAT OR DRINK AFTER MIDNIGHT.  YOU MAY TAKE YOUR MORNING MEDICATIONS WITH WATER.  THE LAB OPENS AT 8:30AM   YOUR LAB WORK IS SCHEDULED FOR [facility] [id number].  Thanks,  Mylo Red RN  [facility]

## 2010-09-25 NOTE — Assessment & Plan Note (Signed)
Summary: 6 month rov.sl  Medications Added LIPITOR 80 MG TABS (ATORVASTATIN CALCIUM) Take one tablet by mouth daily.      Allergies Added: NKDA  Visit Type:  70mo follow up Primary Provider:  [name]  CC:  pt has no complaints today.  History of Present Illness: [name] returns today for E and M of his CAD. He is having no angina or sxs of CHF. His last lipid panel revealed an LDL OF 95, short of goal, on 40mg  of simvastatin.  Problems Prior to Update: 1)  Inguinal Hernia, Left  (ICD-550.90) 2)  Calf Pain, Right  (ICD-729.5) 3)  Edema  (ICD-782.3) 4)  Cad, Artery Bypass Graft  (ICD-414.04) 5)  Myocardial Infarction, Hx of  (ICD-412) 6)  Congestive Heart Failure  (ICD-428.0) 7)  Hyperlipidemia  (ICD-272.4) 8)  Hypertension  (ICD-401.9) 9)  Fatigue  (ICD-780.79) 10)  COPD  (ICD-496) 11)  Psa, Increased  (ICD-790.93) 12)  Erectile Dysfunction, Organic  (ICD-607.84) 13)  Preventive Health Care  (ICD-V70.0) 14)  Glucose Intolerance  (ICD-271.3) 15)  Glaucoma  (ICD-365.9) 16)  Vision Impair Both Eyes Impair Level Nfs  (ICD-369.20) 17)  Postherpetic Neuralgia  (ICD-053.19) 18)  Dysuria  (ICD-788.1) 19)  Shingles  (ICD-053.9) 20)  Allergic Rhinitis  (ICD-477.9) 21)  Psa, Increased  (ICD-790.93) 22)  Special Screening Malignant Neoplasm of Prostate  (ICD-V76.44) 23)  Frequency, Urinary  (ICD-788.41) 24)  Skin Cancer, Hx of  (ICD-V10.83) 25)  Benign Prostatic Hypertrophy  (ICD-600.00) 26)  Anemia-iron Deficiency  (ICD-280.9) 27)  Alcohol Abuse, Hx of  (ICD-V11.3)  Current Medications (verified): 1)  Centrum   Tabs (Multiple Vitamins-Minerals) .... Take 1 Tablet By Mouth Once A Day 2)  Bayer Aspirin Ec Low Dose 81 Mg  Tbec (Aspirin) .... Take 1 Tablet By Mouth Once A Day 3)  Carvedilol 6.25 Mg  Tabs (Carvedilol) .... Take 1 Tablet By Mouth Two Times A Day 4)  Simvastatin 40 Mg  Tabs (Simvastatin) .Marland Kitchen.. 1 By Mouth Once Daily 5)  Diovan Hct 320-25 Mg Tabs  (Valsartan-Hydrochlorothiazide) .Marland Kitchen.. 1 By Mouth Once Daily 6)  Lyrica 50 Mg Caps (Pregabalin) .Marland Kitchen.. 1 Tab At Bedtime 7)  Cialis 20 Mg Tabs (Tadalafil) .Marland Kitchen.. 1po Once Daily As Needed 8)  Nitrostat 0.4 Mg Subl (Nitroglycerin) .Marland Kitchen.. 1 Tablet Under Tongue At Onset of Chest Pain; You May Repeat Every 5 Minutes For Up To 3 Doses. 9)  Spironolactone 25 Mg Tabs (Spironolactone) .... Take 1 Tablet Daily 10)  Tramadol Hcl 50 Mg Tabs (Tramadol Hcl) .Marland Kitchen.. 1 By Mouth Q 6 Hrs As Needed Pain  Allergies (verified): No Known Drug Allergies  Past History:  Past Medical History: Last updated: 08/16/2009 CAD, ARTERY BYPASS GRAFT (ICD-414.04) MYOCARDIAL INFARCTION, HX OF (ICD-412) CONGESTIVE HEART FAILURE (ICD-428.0) HYPERLIPIDEMIA (ICD-272.4) HYPERTENSION (ICD-401.9) FATIGUE (ICD-780.79) COPD (ICD-496) PSA, INCREASED (ICD-790.93) ERECTILE DYSFUNCTION, ORGANIC (ICD-607.84) PREVENTIVE HEALTH CARE (ICD-V70.0) GLUCOSE INTOLERANCE (ICD-271.3) GLAUCOMA (ICD-365.9) VISION IMPAIR BOTH EYES IMPAIR LEVEL NFS (ICD-369.20) POSTHERPETIC NEURALGIA (ICD-053.19) DYSURIA (ICD-788.1) SHINGLES (ICD-053.9) ALLERGIC RHINITIS (ICD-477.9) PSA, INCREASED (ICD-790.93) SPECIAL SCREENING MALIGNANT NEOPLASM OF PROSTATE (ICD-V76.44) FREQUENCY, URINARY (ICD-788.41) SKIN CANCER, HX OF (ICD-V10.83) BENIGN PROSTATIC HYPERTROPHY (ICD-600.00) ANEMIA-IRON DEFICIENCY (ICD-280.9) ALCOHOL ABUSE, HX OF (ICD-V11.3) CROHN'S DISEASE (ICD-555.9)    Past Surgical History: Last updated: 08/28/2010 Coronary artery bypass graft... 11 years ago.Marland KitchenHe has an EF of about 45%, and his last stress Myoview   showed no ischemia Dec 30, 2006.  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 Head:  normocephalic and atraumatic Eyes:  PERRLA/EOM intact; conjunctiva and lids normal. Neck:  Neck supple, no JVD. No masses, thyromegaly or abnormal cervical nodes. Chest Benjimin Hadden:  no deformities or breast masses noted Lungs:  Clear bilaterally to auscultation and percussion. Heart:  PMI nondisplaced, RRR, nl S1 and S2, no carotid bruits. Abdomen:  Bowel sounds positive; abdomen soft and non-tender without masses, organomegaly, or hernias noted. No hepatosplenomegaly. Msk:  Back normal, normal gait. Muscle strength and tone normal. Pulses:  pulses normal in all 4 extremities Extremities:  No clubbing or cyanosis. Neurologic:  Alert and oriented x 3. Skin:  Intact without lesions or rashes. Psych:  Normal affect.   Impression & Recommendations:  Problem # 1:  CAD, ARTERY BYPASS GRAFT (ICD-414.04) Assessment Unchanged  His updated medication list for this problem includes:    Bayer Aspirin Ec Low Dose 81 Mg Tbec (Aspirin) .Marland Kitchen... Take 1 tablet by mouth once a day    Carvedilol 6.25 Mg Tabs (Carvedilol) .Marland Kitchen... Take 1 tablet by mouth two times a day    Nitrostat 0.4 Mg Subl (Nitroglycerin) .Marland Kitchen... 1 tablet under tongue at onset of chest pain; you may repeat every 5 minutes for up to 3 doses.  Problem # 2:  MYOCARDIAL INFARCTION, HX OF (ICD-412) Assessment: Unchanged  His updated medication list for this problem includes:    Bayer Aspirin Ec Low Dose 81 Mg Tbec (Aspirin) .Marland Kitchen... Take 1  tablet by mouth once a day    Carvedilol 6.25 Mg Tabs (Carvedilol) .Marland Kitchen... Take 1 tablet by mouth two times a day    Nitrostat 0.4 Mg Subl (Nitroglycerin) .Marland Kitchen... 1 tablet under tongue at onset of chest pain; you may repeat every 5 minutes for up to 3 doses.  Problem # 3:  CONGESTIVE HEART FAILURE (ICD-428.0) Assessment: Improved  His updated medication list for this problem includes:    Bayer Aspirin Ec Low Dose 81 Mg Tbec (Aspirin) .Marland Kitchen... Take 1 tablet by mouth once a day    Carvedilol 6.25 Mg Tabs (Carvedilol) .Marland Kitchen... Take 1 tablet by mouth two times a day    Diovan Hct 320-25 Mg Tabs (Valsartan-hydrochlorothiazide) .Marland Kitchen... 1 by mouth once daily    Nitrostat 0.4 Mg Subl (Nitroglycerin) .Marland Kitchen... 1 tablet under tongue at onset of chest pain; you may repeat every 5 minutes for up to 3 doses.    Spironolactone 25 Mg Tabs (Spironolactone) .Marland Kitchen... Take 1 tablet daily  Problem # 4:  HYPERLIPIDEMIA (ICD-272.4) Assessment: Deteriorated LDL not at goal, change to atorvastatin 80mg  q day, followup labs His updated medication list for this problem includes:    Lipitor 80 Mg  Tabs (Atorvastatin calcium) .Marland Kitchen... Take one tablet by mouth daily.  Patient Instructions: 1)  Your physician recommends that you schedule a follow-up appointment in: 6 months with Dr. Daleen Squibb 2)  Your physician recommends that you return for a FASTING lipid profile  and liver in 6-8 weeks  272.4/dfg 3)  Your physician has recommended you make the following change in your medication:  Prescriptions: LIPITOR 80 MG TABS (ATORVASTATIN CALCIUM) Take one tablet by mouth daily.  #30 x 11   Entered by:   Lisabeth Devoid RN   Authorized by:   Gaylord Shih, MD, [facility]   Signed by:   Lisabeth Devoid RN on 09/17/2010   Method used:   Faxed to ...       [facility] (retail)       [address]       [place], [place]  [address]       Ph: [phone]       Fax: [phone]   RxID:    [id number]   Appended Document: 6 month rov.sl Pt scheduled for labs on 11/05/10 Letter sent to remind him after talking with pt. Mylo Red RN

## 2010-10-28 LAB — BASIC METABOLIC PANEL
Calcium: 10 mg/dL (ref 8.4–10.5)
GFR calc Af Amer: 52 mL/min — ABNORMAL LOW (ref >60)
GFR calc non Af Amer: 43 mL/min — ABNORMAL LOW (ref >60)
Glucose, Bld: 99 mg/dL (ref 70–99)
Potassium: 4.2 mEq/L (ref 3.5–5.1)
Sodium: 136 mEq/L (ref 135–145)

## 2010-10-28 LAB — CBC
Hemoglobin: 12.6 g/dL — ABNORMAL LOW (ref 13.0–17.0)
MCHC: 34 g/dL (ref 30.0–36.0)
RDW: 13.7 % (ref 11.5–15.5)
WBC: 4.1 10*3/uL (ref 4.0–10.5)

## 2010-10-28 LAB — SURGICAL PCR SCREEN
MRSA, PCR: NEGATIVE
Staphylococcus aureus: NEGATIVE

## 2010-11-01 ENCOUNTER — Other Ambulatory Visit: Payer: Self-pay

## 2010-11-05 ENCOUNTER — Ambulatory Visit (INDEPENDENT_AMBULATORY_CARE_PROVIDER_SITE_OTHER): Payer: Medicare (Managed Care) | Admitting: *Deleted

## 2010-11-05 DIAGNOSIS — E785 Hyperlipidemia, unspecified: Secondary | ICD-10-CM

## 2010-11-05 LAB — HEPATIC FUNCTION PANEL
ALT: 24 U/L (ref 0–53)
AST: 24 U/L (ref 0–37)
Alkaline Phosphatase: 60 U/L (ref 39–117)
Total Bilirubin: 0.8 mg/dL (ref 0.3–1.2)

## 2010-11-05 LAB — LIPID PANEL: HDL: 35.1 mg/dL — ABNORMAL LOW (ref >39.00)

## 2010-11-19 NOTE — Progress Notes (Signed)
Pt aware of lab results. Debbie Devlyn Parish RN  

## 2010-12-31 NOTE — Assessment & Plan Note (Signed)
[facility]                            CARDIOLOGY OFFICE NOTE   Gary Levy                     MRN:          [id number]  DATE:12/31/2007                            DOB:          [birth year]    Gary Levy comes in today for management of the following problems.  He  had no complaints today specifically no chest pain, angina, shortness of  breath, dyspnea on exertion, orthopnea, PND, peripheral edema,  palpitations, syncope, presyncope.   Unfortunately he has run out of his blood pressure medicine.  He is  waiting for his mail order to send them.   PROBLEM LIST:  1. Coronary artery disease.  He is status post coronary bypass      grafting 11 years ago.  Subsequent coronary intervention of the      diagonal.  He has an EF of about 45%, and his last stress Myoview      showed no ischemia Dec 30, 2006.  2. Hypertension.  3. Mixed hyperlipidemia.   CURRENT MEDICATIONS:  1. Vytorin 10/20 daily.  2. Diovan HCTZ 160/12.5 daily.  3. Enteric-coated aspirin 81 mg a day.  4. Multivitamin daily.  5. Travatan eye drops.  6. Coreg CR 20 mg a day.   Money is becoming an issue with his drugs.   PHYSICAL EXAMINATION:  VITAL SIGNS:  Blood pressure today 171/82, pulse  59 and regular.  His EKG shows sinus brady at a rate of 50 with poor  progression across the anteprecordium with ST segment changes laterally  showing some degree of increased hypertrophy.  His weight is 178 which  is up 5.  HEENT:  Unchanged.  NECK:  Carotids are full without bruits, no JVD.  Thyroid is not  enlarged.  Trachea is midline.  LUNGS:  Clear.  HEART:  Reveals a nondisplaced PMI.  He has normal S1, S2 without  gallop.  ABDOMEN:  Soft, good bowel sounds.  No midline bruit.  No hepatomegaly.  EXTREMITIES:  With no cyanosis, clubbing or edema.  Pulses are intact.  NEUROLOGICAL:  Intact.  SKIN:  Unremarkable.   I had a long talk with Gary Levy today.  I have given him  Benicar 20/HCTZ  12.5 samples for two weeks until his meds arrive.  I have actually given  him one in the office today.  I have warned him about running out of  these in the future.   He will be due lipids and blood work in the fall.  I will have him  return then.   To save him money I have changed him to simvastatin 40 mg q.h.s., and  also put him back on Carvedilol 6.25 b.i.d. instead of the Coreg CR.     Gary C. Daleen Squibb, MD, [facility]  Electronically Signed    TCW/MedQ  DD: 12/31/2007  DT: 12/31/2007  Job #: [id number]

## 2010-12-31 NOTE — Assessment & Plan Note (Signed)
[facility]                            CARDIOLOGY OFFICE NOTE   SHMIEL, MORTON                     MRN:          [id number]  DATE:12/18/2006                            DOB:          [birth year]    [name] comes in today for further management of the following issues:   PROBLEMS:  1. Coronary artery disease.  He is status post coronary artery bypass      grafting nearly 11 years ago.  He has had a coronary intervention      as well to his diagonal.  Ejection fraction is 45% by last stress      Cardiolite in July, 2004.  He is having no ischemic symptoms.  2. Hypertension.  3. Mixed hyperlipidemia.  Lipids were at goal in August, other than      his HDL being slightly low at 52.8.  He denies any symptoms at      present.  He is able to do all he wants to do.  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   MEDICATIONS:  1. Vytorin 10/20 daily.  2. Diovan/HCT 160/12.5 daily.  3. Aspirin 81 mg daily.  4. Coreg CR 20 mg a day.   PHYSICAL EXAMINATION:  VITAL SIGNS:  His blood pressure today is 106/74.  His pulse is 60 and regular.  His weight is 173.  HEENT:  Normocephalic and atraumatic.  Extraocular movements are intact.  Sclerae are clear.  Facial symmetry is normal.  NECK:  Carotid upstrokes are equal bilaterally without bruits.  There is  no JVD.  Thyroid is not enlarged.  Trachea is midline.  LUNGS:  Clear.  HEART:  Regular rate and rhythm without murmur, rub or gallop.  He has  normal PMI.  ABDOMEN:  Soft with good bowel sounds.  EXTREMITIES:  No edema.  Pulses are intact.  NEURO:  Intact.   His electrocardiogram is normal except for small Q's in the lateral  leads.  There has been no major change.   I am delighted with how [name] is doing.  He needs a stress Myoview.  If this is negative for ischemia, I will plan on seeing him back again  in a year.  He will be  due lipids in August.     Thomas C. Daleen Squibb, MD, [facility]  Electronically Signed   TCW/MedQ  DD: 12/18/2006  DT: 12/18/2006  Job #: [id number]

## 2010-12-31 NOTE — Assessment & Plan Note (Signed)
[facility]                            CARDIOLOGY OFFICE NOTE   Gary Levy, Gary Levy                     MRN:          [id number]  DATE:06/21/2008                            DOB:          [birth year]    Gary Levy comes in today for followup.  He is having no symptoms of  angina or ischemia.  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  He states it is too much for him.  He is retired from his formal  job.   He has lost about 13 pounds.  He states his appetite has not changed.  He is just not eating as much.  He thinks his job also wore him down.  He seems a little more stressed today than usual.   He denies any change in bowel habits.  He has screening colonoscopy by  Dr. Lina Levy on October 07, 2006, which was negative.   He denies orthopnea, PND, tachypalpitations, syncope, or presyncope.   He had recent blood work with Dr. Oliver Levy, which showed his lipids to  be at goal except for his LDL slightly elevated at 80.   I also noted his LFTs including his albumin were normal.  TSH was also  normal.   CURRENT MEDICATIONS:  1. Simvastatin 40 mg nightly.  2. Carvedilol 6.25 b.i.d.  3. Oxybutynin 5 mg daily.  4. Diovan/HCTZ 320/12.5 daily.  5. Multivitamin.  6. Enteric-coated aspirin 81 mg a day.   PHYSICAL EXAMINATION:  VITAL SIGNS:  His blood pressure today was  138/80, which is much better than last time.  His pulse is 64 and  regular.  HEENT:  Normal.  NECK:  Carotid upstrokes are equal bilaterally without bruits.  Thyroid  is not enlarged.  Trachea is midline.  LUNGS:  Clear to auscultation and percussion.  HEART:  Regular rate and rhythm.  No gallop or murmur.  ABDOMEN:  Soft.  No pulsatile mass.  No hepatomegaly.  No organomegaly.  EXTREMITIES:  There is no cyanosis, clubbing, or edema.  Pulses are  intact.   His EKG from Dr. Raphael Levy office shows no acute changes.   Gary Levy is doing well from my standpoint.  I am  a little concerned  about his weight loss.  I have encouraged him to eat more to stabilize  his weight.  If he is not, he will let me know.  We will plan on seeing  him back in 6 months.  At that time, he will need a stress Myoview.     Gary C. Daleen Squibb, MD, [facility]  Electronically Signed    TCW/MedQ  DD: 06/21/2008  DT: 06/22/2008  Job #: [id number]

## 2011-01-03 NOTE — Assessment & Plan Note (Signed)
[facility]                         GASTROENTEROLOGY OFFICE NOTE   MEHUL, RUDIN                     MRN:          [id number]  DATE:10/06/2006                            DOB:          1941-07-31    [name] is a 70 year old gentleman with history of coronary artery  disease, status post coronary artery bypass graft followed by Dr. Jonny Ruiz.  He has also history of hypertension, hyperlipidemia and smoking.  He is  here to discuss colorectal screening.  We saw Mr. Buenger 10 years ago, in  1998, for screening colonoscopy.  His exam was normal with exception of  internal hemorrhoids.  He has no family history of colon cancer.  He  denies rectal bleeding.  His bowel habits are regular.   MEDICATIONS:  1. Vytorin 10/20 one p.o. daily.  2. Coreg 6.25 mg p.o. daily.  3. Diovan/hydrochlorothiazide 160/12.5 p.o. daily.  4. Aspirin 81 mg p.o. daily.  5. Multivitamins.   PHYSICAL EXAMINATION:  Blood pressure 130/72, pulse 56, weight 173  pounds.  He was alert, oriented, in no distress.  LUNGS:  Clear to auscultation.  COR:  Normal S1, normal S2.  ABDOMEN:  Soft, relaxed, with normoactive bowel sounds, no tenderness.  Liver edge at the costal margin.  RECTAL:  Exam not done.   IMPRESSION:  [age]-year-old gentleman who is here to discuss a  repeat colonoscopy.  He is due at the 10-year interval.  In the interim  had coronary artery disease and is on multiple cardiac medications.  I  do not see any contraindication to colonoscopy as long as he stays on  his aspirin 81 mg a day.   PLAN:  Colonoscopy scheduled with routine colonoscopy prep.     Hedwig Morton. Juanda Chance, MD  Electronically Signed    DMB/MedQ  DD: 10/06/2006  DT: 10/07/2006  Job #: [id number]   cc:   Corwin Levins, MD

## 2011-02-07 ENCOUNTER — Other Ambulatory Visit: Payer: Self-pay | Admitting: Internal Medicine

## 2011-02-14 ENCOUNTER — Telehealth: Payer: Self-pay

## 2011-02-14 NOTE — Telephone Encounter (Signed)
[facility] called on behalf of Gary Levy, requesting a referral for Glaucoma management.

## 2011-02-14 NOTE — Telephone Encounter (Signed)
Not sure a paper referral is needed; but will try to assist  Refer to optho - glaucoma - done per emr

## 2011-03-11 IMAGING — CR DG CHEST 2V
2 series · 2 of 2 positions shown · non-contrast
Comparison: None.

CLINICAL DATA: Preop for surgical repair of left inguinal hernia,
hypertension

CHEST - 2 VIEW

[view not recorded (1 of 2)]
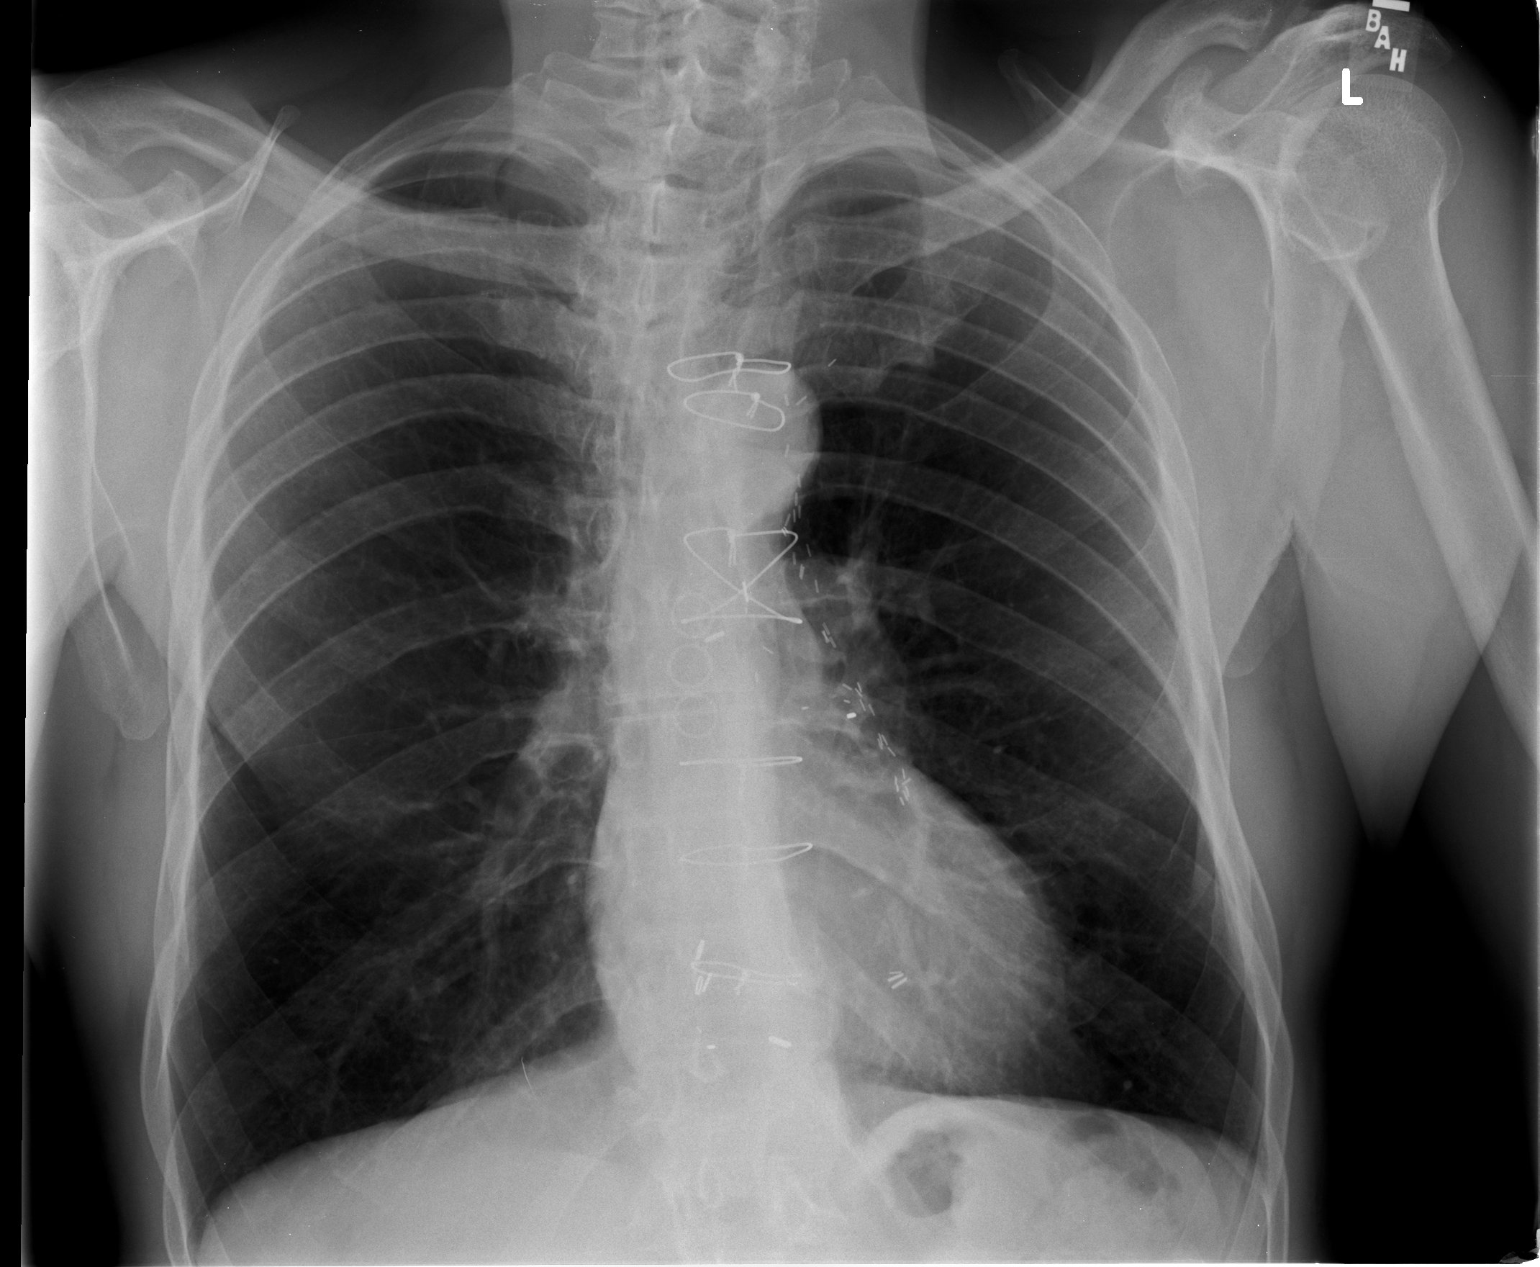

[view not recorded (2 of 2)]
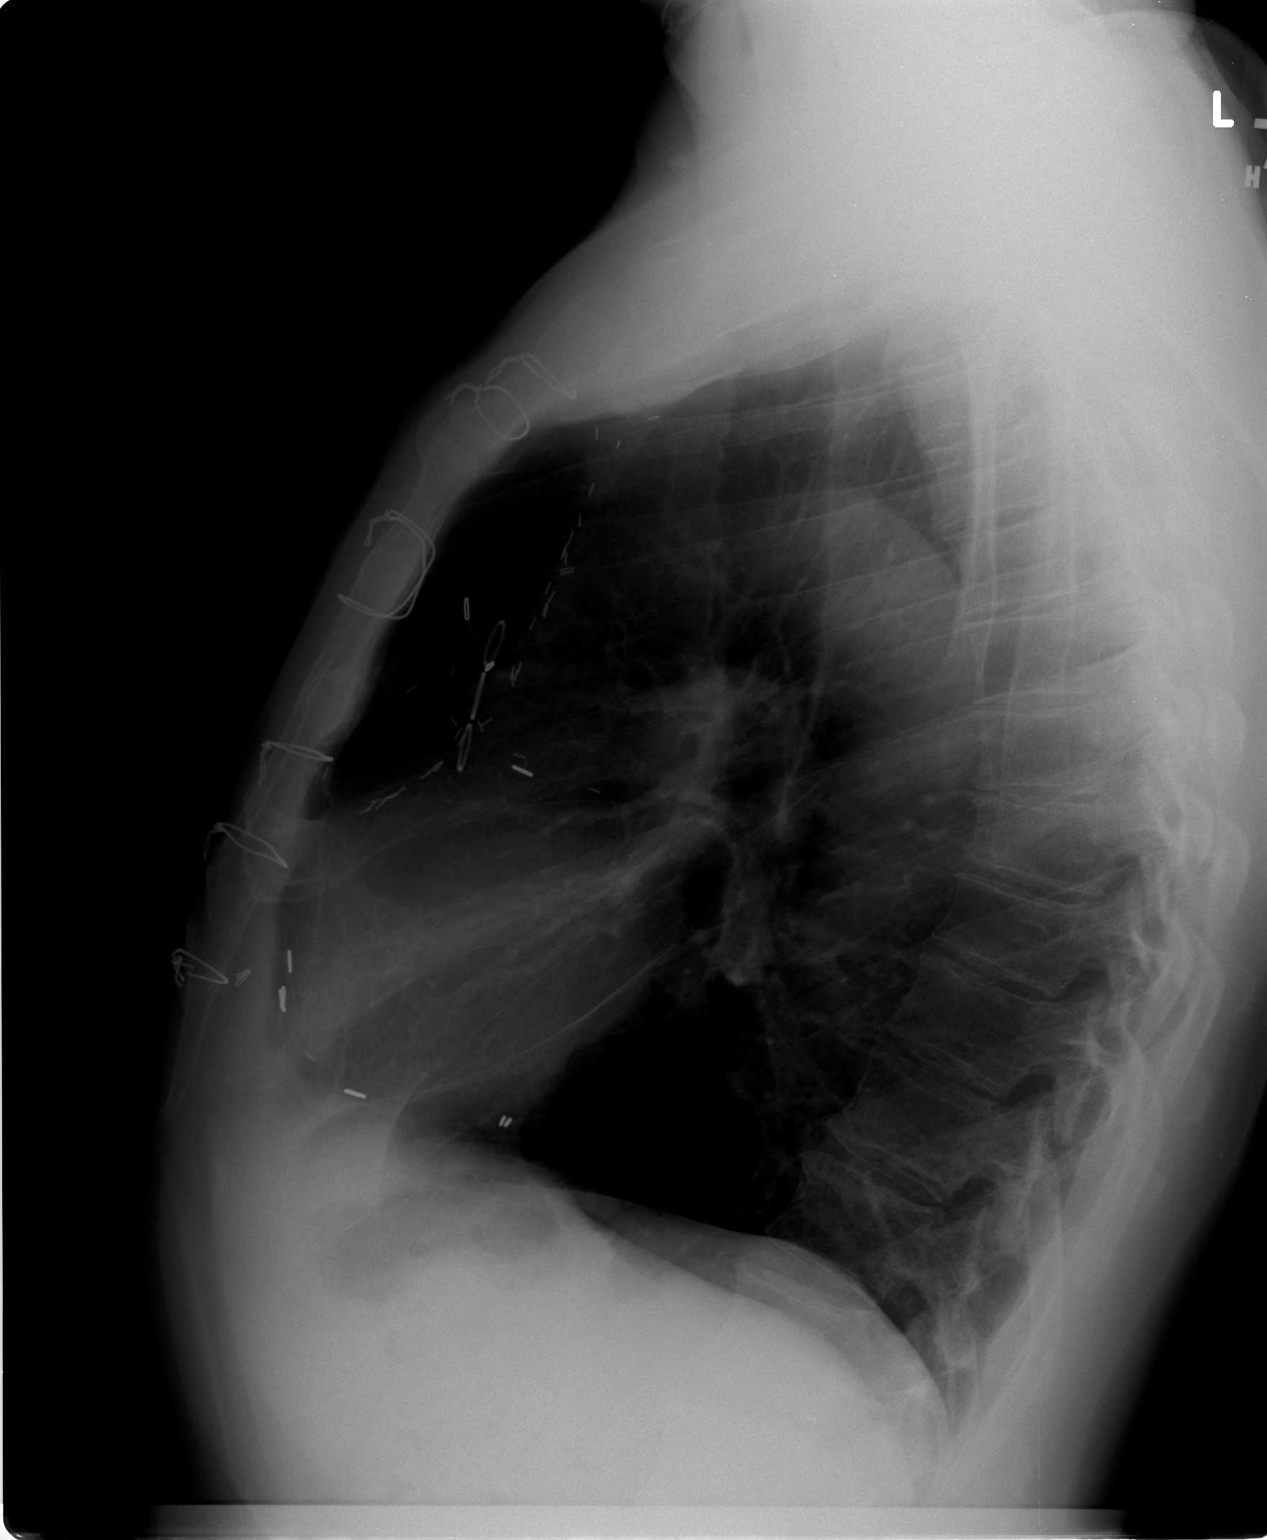

[2 of 2 positions shown; findings below may reference images not displayed]

FINDINGS: The lungs are clear and hyperaerated consistent with
COPD.  Mediastinal contours appear normal.  The heart is within
normal limits in size.  Median sternotomy sutures are noted from
prior CABG.  No bony abnormality is seen.
IMPRESSION: COPD.  No active lung disease.

## 2011-03-17 ENCOUNTER — Encounter: Payer: Self-pay | Admitting: Cardiology

## 2011-03-21 ENCOUNTER — Encounter: Payer: Self-pay | Admitting: Cardiology

## 2011-03-21 ENCOUNTER — Ambulatory Visit (INDEPENDENT_AMBULATORY_CARE_PROVIDER_SITE_OTHER): Payer: No Typology Code available for payment source | Admitting: Cardiology

## 2011-03-21 DIAGNOSIS — E785 Hyperlipidemia, unspecified: Secondary | ICD-10-CM

## 2011-03-21 DIAGNOSIS — I10 Essential (primary) hypertension: Secondary | ICD-10-CM

## 2011-03-21 DIAGNOSIS — I252 Old myocardial infarction: Secondary | ICD-10-CM

## 2011-03-21 DIAGNOSIS — I25810 Atherosclerosis of coronary artery bypass graft(s) without angina pectoris: Secondary | ICD-10-CM

## 2011-03-21 NOTE — Patient Instructions (Signed)
Your physician recommends that you schedule a follow-up appointment in: 6 months with Dr. Wall  

## 2011-03-21 NOTE — Progress Notes (Signed)
HPI Gary Levy comes in for evaluation and management of his coronary artery disease, history of myocardial infarction, picture combined congestive heart failure, hyperlipidemia, and hypertension.  He denies any chest pain or angina. He remains very active in his garden in his yard. He said no orthopnea, PND or edema. His weight has been stable.  EKG today shows normal sinus rhythm with no changes from baseline. Past Medical History  Diagnosis Date  . CAD (coronary artery disease) of artery bypass graft   . MI (myocardial infarction)     hx  . CHF (congestive heart failure)   . HLD (hyperlipidemia)   . HTN (hypertension)   . Fatigue   . COPD (chronic obstructive pulmonary disease)   . PSA elevation   . ED (erectile dysfunction) of organic origin   . Routine general medical examination at a health care facility   . Glucose intolerance (impaired glucose tolerance)   . Glaucoma   . Moderate or severe vision impairment, both eyes, impairment level not further specified   . Postherpetic neuralgia   . Dysuria   . Shingles   . AR (allergic rhinitis)   . Special screening for malignant neoplasm of prostate   . Urinary frequency   . Skin cancer     hx; non-melanoma  . BPH (benign prostatic hypertrophy)   . Anemia, iron deficiency   . Alcohol abuse     hx  . Crohn's disease     Past Surgical History  Procedure Date  . Coronary artery bypass graft     11+ years ago; has an EF of about 45%, last Myoview showed no ischemia 12/30/06  . Coronary angioplasty with stent placement   . Right cataract 12/11    with lens implant   . Lih 2011    Dr. Rayburn Ma     Family History  Problem Relation Age of Onset  . Heart disease Mother     History   Social History  . Marital Status: Married    Spouse Name: N/A    Number of Children: N/A  . Years of Education: N/A   Occupational History  . Not on file.   Social History Main Topics  . Smoking status: Former Smoker    Quit date:  03/20/1981  . Smokeless tobacco: Never Used  . Alcohol Use: No  . Drug Use: No  . Sexually Active: Not on file   Other Topics Concern  . 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 [employer]. 01/15/10.     No Known Allergies  Current Outpatient Prescriptions  Medication Sig Dispense Refill  . aspirin 81 MG EC tablet Take 81 mg by mouth daily.        Marland Kitchen atorvastatin (LIPITOR) 80 MG tablet Take 80 mg by mouth daily.        . carvedilol (COREG) 6.25 MG tablet Take 6.25 mg by mouth 2 (two) times daily.        Marland Kitchen DIOVAN HCT 320-25 MG per tablet TAKE 1 TABLET EVERY DAY  30 tablet  8  . Multiple Vitamins-Minerals (CENTRUM PO) Take 1 tablet by mouth daily.        . nitroGLYCERIN (NITROSTAT) 0.4 MG SL tablet Place 0.4 mg under the tongue every 5 (five) minutes as needed.        . pregabalin (LYRICA) 50 MG capsule Take 50 mg by mouth at bedtime.        Marland Kitchen  spironolactone (ALDACTONE) 25 MG tablet Take 25 mg by mouth daily.        . tadalafil (CIALIS) 20 MG tablet Take 20 mg by mouth daily as needed.        . traMADol (ULTRAM) 50 MG tablet Take 50 mg by mouth every 6 (six) hours as needed.          ROS Negative other than HPI.   PE General Appearance: well developed, well nourished in no acute distress HEENT: symmetrical face, PERRLA, good dentition  Neck: no JVD, thyromegaly, or adenopathy, trachea midline Chest: symmetric without deformity Cardiac: PMI non-displaced, RRR, normal S1, S2, no gallop or murmur Lung: clear to ausculation and percussion Vascular: all pulses full without bruits  Abdominal: nondistended, nontender, good bowel sounds, no HSM, no bruits Extremities: no cyanosis, clubbing or edema, no sign of DVT, no varicosities  Skin: normal color, no rashes Neuro: alert and oriented x 3, non-focal Pysch: normal affect Filed Vitals:   03/21/11 1200  BP: 120/72  Pulse: 63  Height: 5\' 11"  (1.803  m)  Weight: 168 lb 12.8 oz (76.567 kg)    EKG  Labs and Studies Reviewed.   Lab Results  Component Value Date   WBC 4.4* 08/26/2010   HGB 11.3* 08/26/2010   HCT 33.8* 08/26/2010   MCV 85.6 08/26/2010   PLT 212.0 08/26/2010      Chemistry      Component Value Date/Time   NA 139 08/26/2010 0807   K 5.1 08/26/2010 0807   CL 105 08/26/2010 0807   CO2 30 08/26/2010 0807   BUN 19 08/26/2010 0807   CREATININE 1.3 08/26/2010 0807      Component Value Date/Time   CALCIUM 9.5 08/26/2010 0807   ALKPHOS 60 11/05/2010 1510   AST 24 11/05/2010 1510   ALT 24 11/05/2010 1510   BILITOT 0.8 11/05/2010 1510       Lab Results  Component Value Date   CHOL 120 11/05/2010   CHOL 146 08/26/2010   CHOL 147 08/15/2009   Lab Results  Component Value Date   HDL 35.10* 11/05/2010   HDL 41.40 08/26/2010   HDL 48.30 08/15/2009   Lab Results  Component Value Date   LDLCALC 72 11/05/2010   LDLCALC 95 08/26/2010   LDLCALC 87 08/15/2009   Lab Results  Component Value Date   TRIG 64.0 11/05/2010   TRIG 47.0 08/26/2010   TRIG 61.0 08/15/2009   Lab Results  Component Value Date   CHOLHDL 3 11/05/2010   CHOLHDL 4 08/26/2010   CHOLHDL 3 08/15/2009   Lab Results  Component Value Date   HGBA1C 5.4 08/15/2009   Lab Results  Component Value Date   ALT 24 11/05/2010   AST 24 11/05/2010   ALKPHOS 60 11/05/2010   BILITOT 0.8 11/05/2010   Lab Results  Component Value Date   TSH 2.67 08/26/2010

## 2011-03-21 NOTE — Assessment & Plan Note (Signed)
Stable. No change in treatment. 

## 2011-05-14 ENCOUNTER — Telehealth: Payer: Self-pay | Admitting: Cardiology

## 2011-05-14 NOTE — Telephone Encounter (Addendum)
Walk In Pt Form" Pt Dropped off Application for Disability Placement Card, sent to Mylo Red  05/14/11/km  Called pt to pick up Application for Permanent Placement Card, he will pick up today  05/19/11/km

## 2011-06-14 ENCOUNTER — Other Ambulatory Visit: Payer: Self-pay | Admitting: Cardiology

## 2011-08-27 ENCOUNTER — Telehealth: Payer: Self-pay

## 2011-08-27 DIAGNOSIS — Z1289 Encounter for screening for malignant neoplasm of other sites: Secondary | ICD-10-CM

## 2011-08-27 DIAGNOSIS — Z0000 Encounter for general adult medical examination without abnormal findings: Secondary | ICD-10-CM

## 2011-08-27 NOTE — Telephone Encounter (Signed)
Put order in for physical labs. 

## 2011-09-18 ENCOUNTER — Encounter: Payer: Self-pay | Admitting: Cardiology

## 2011-09-18 ENCOUNTER — Ambulatory Visit (INDEPENDENT_AMBULATORY_CARE_PROVIDER_SITE_OTHER): Payer: Medicare HMO | Admitting: Cardiology

## 2011-09-18 VITALS — BP 118/78 | HR 72 | Ht 70.0 in | Wt 163.0 lb

## 2011-09-18 DIAGNOSIS — I5042 Chronic combined systolic (congestive) and diastolic (congestive) heart failure: Secondary | ICD-10-CM

## 2011-09-18 DIAGNOSIS — E739 Lactose intolerance, unspecified: Secondary | ICD-10-CM

## 2011-09-18 DIAGNOSIS — I25810 Atherosclerosis of coronary artery bypass graft(s) without angina pectoris: Secondary | ICD-10-CM

## 2011-09-18 DIAGNOSIS — E785 Hyperlipidemia, unspecified: Secondary | ICD-10-CM

## 2011-09-18 DIAGNOSIS — I252 Old myocardial infarction: Secondary | ICD-10-CM

## 2011-09-18 DIAGNOSIS — I10 Essential (primary) hypertension: Secondary | ICD-10-CM

## 2011-09-18 NOTE — Patient Instructions (Signed)
Your physician wants you to follow-up in: 6 months with dr. wall You will receive a reminder letter in the mail two months in advance. If you don't receive a letter, please call our office to schedule the follow-up appointment.   Your physician recommends that you continue on your current medications as directed. Please refer to the Current Medication list given to you today.  

## 2011-09-18 NOTE — Progress Notes (Signed)
HPI  Gary Levy comes in today for evaluation and management of his coronary disease, history of bypass surgery, chronic combined congestive heart failure, an old MI.  Remarkably well without any symptoms of angina or chest pain. He denies orthopnea, PND or edema. His weight has been stable. His very compliant with his meds. Past Medical History  Diagnosis Date  . CAD (coronary artery disease) of artery bypass graft   . MI (myocardial infarction)     hx  . CHF (congestive heart failure)   . HLD (hyperlipidemia)   . HTN (hypertension)   . Fatigue   . COPD (chronic obstructive pulmonary disease)   . PSA elevation   . ED (erectile dysfunction) of organic origin   . Routine general medical examination at a health care facility   . Glucose intolerance (impaired glucose tolerance)   . Glaucoma   . Moderate or severe vision impairment, both eyes, impairment level not further specified   . Postherpetic neuralgia   . Dysuria   . Shingles   . AR (allergic rhinitis)   . Special screening for malignant neoplasm of prostate   . Urinary frequency   . Skin cancer     hx; non-melanoma  . BPH (benign prostatic hypertrophy)   . Anemia, iron deficiency   . Alcohol abuse     hx  . Crohn's disease     Current Outpatient Prescriptions  Medication Sig Dispense Refill  . aspirin 81 MG EC tablet Take 81 mg by mouth daily.        Marland Kitchen atorvastatin (LIPITOR) 80 MG tablet Take 80 mg by mouth daily.        . carvedilol (COREG) 6.25 MG tablet Take 6.25 mg by mouth 2 (two) times daily.        Marland Kitchen DIOVAN HCT 320-25 MG per tablet TAKE 1 TABLET EVERY DAY  30 tablet  8  . Multiple Vitamins-Minerals (CENTRUM PO) Take 1 tablet by mouth daily.        . nitroGLYCERIN (NITROSTAT) 0.4 MG SL tablet Place 0.4 mg under the tongue every 5 (five) minutes as needed.        . pregabalin (LYRICA) 50 MG capsule Take 50 mg by mouth at bedtime.        Marland Kitchen spironolactone (ALDACTONE) 25 MG tablet TAKE 1 TABLET EVERY DAY  30 tablet   6  . tadalafil (CIALIS) 20 MG tablet Take 20 mg by mouth daily as needed.        . traMADol (ULTRAM) 50 MG tablet Take 50 mg by mouth every 6 (six) hours as needed.          No Known Allergies  Family History  Problem Relation Age of Onset  . Heart disease Mother     History   Social History  . Marital Status: Married    Spouse Name: N/A    Number of Children: N/A  . Years of Education: N/A   Occupational History  . Not on file.   Social History Main Topics  . Smoking status: Former Smoker    Quit date: 03/20/1981  . Smokeless tobacco: Never Used  . Alcohol Use: No  . Drug Use: No  . Sexually Active: Not on file   Other Topics Concern  . 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 [employer]. 01/15/10.     ROS ALL NEGATIVE EXCEPT THOSE NOTED IN HPI  PE  General Appearance: well developed, well nourished in no acute distress HEENT: symmetrical face, PERRLA, good dentition  Neck: no JVD, thyromegaly, or adenopathy, trachea midline Chest: symmetric without deformity Cardiac: PMI non-displaced, RRR, normal S1, S2, no gallop or murmur Lung: clear to ausculation and percussion Vascular: all pulses full without bruits  Abdominal: nondistended, nontender, good bowel sounds, no HSM, no bruits Extremities: no cyanosis, clubbing or edema, no sign of DVT, no varicosities  Skin: normal color, no rashes Neuro: alert and oriented x 3, non-focal Pysch: normal affect  EKG  BMET    Component Value Date/Time   NA 139 08/26/2010 0807   K 5.1 08/26/2010 0807   CL 105 08/26/2010 0807   CO2 30 08/26/2010 0807   GLUCOSE 99 08/26/2010 0807   BUN 19 08/26/2010 0807   CREATININE 1.3 08/26/2010 0807   CALCIUM 9.5 08/26/2010 0807   GFRNONAA 43* 07/19/2010 1418   GFRAA  Value: 52        The eGFR has been calculated using the MDRD equation. This calculation has not been validated in all clinical situations. eGFR's  persistently <60 mL/min signify possible Chronic Kidney Disease.* 07/19/2010 1418    Lipid Panel     Component Value Date/Time   CHOL 120 11/05/2010 1510   TRIG 64.0 11/05/2010 1510   HDL 35.10* 11/05/2010 1510   CHOLHDL 3 11/05/2010 1510   VLDL 12.8 11/05/2010 1510   LDLCALC 72 11/05/2010 1510    CBC    Component Value Date/Time   WBC 4.4* 08/26/2010 0807   RBC 3.95* 08/26/2010 0807   HGB 11.3* 08/26/2010 0807   HCT 33.8* 08/26/2010 0807   PLT 212.0 08/26/2010 0807   MCV 85.6 08/26/2010 0807   MCH 28.4 07/19/2010 1418   MCHC 33.5 08/26/2010 0807   RDW 14.6 08/26/2010 0807   LYMPHSABS 1.0 08/26/2010 0807   MONOABS 0.6 08/26/2010 0807   EOSABS 0.1 08/26/2010 0807   BASOSABS 0.0 08/26/2010 1610

## 2011-09-18 NOTE — Assessment & Plan Note (Signed)
Doing well without symptoms of ischemia or angina. No change in therapy.

## 2011-09-18 NOTE — Assessment & Plan Note (Signed)
Stable. No change in medical therapy. See [place] back in the office in 6 months

## 2011-09-20 ENCOUNTER — Other Ambulatory Visit: Payer: Self-pay | Admitting: Cardiology

## 2011-09-23 ENCOUNTER — Other Ambulatory Visit (INDEPENDENT_AMBULATORY_CARE_PROVIDER_SITE_OTHER): Payer: Medicare HMO

## 2011-09-23 DIAGNOSIS — E785 Hyperlipidemia, unspecified: Secondary | ICD-10-CM

## 2011-09-23 DIAGNOSIS — Z125 Encounter for screening for malignant neoplasm of prostate: Secondary | ICD-10-CM

## 2011-09-23 DIAGNOSIS — Z0000 Encounter for general adult medical examination without abnormal findings: Secondary | ICD-10-CM

## 2011-09-23 DIAGNOSIS — Z1289 Encounter for screening for malignant neoplasm of other sites: Secondary | ICD-10-CM

## 2011-09-23 DIAGNOSIS — I10 Essential (primary) hypertension: Secondary | ICD-10-CM

## 2011-09-23 LAB — LIPID PANEL
HDL: 42.4 mg/dL (ref >39.00)
Triglycerides: 43 mg/dL (ref 0.0–149.0)

## 2011-09-23 LAB — BASIC METABOLIC PANEL
CO2: 28 mEq/L (ref 19–32)
Calcium: 9.6 mg/dL (ref 8.4–10.5)
GFR: 52.43 mL/min — ABNORMAL LOW (ref >60.00)
Sodium: 140 mEq/L (ref 135–145)

## 2011-09-23 LAB — URINALYSIS, ROUTINE W REFLEX MICROSCOPIC
Nitrite: NEGATIVE
Specific Gravity, Urine: 1.015 (ref 1.000–1.030)
Total Protein, Urine: NEGATIVE
Urine Glucose: NEGATIVE
pH: 6 (ref 5.0–8.0)

## 2011-09-23 LAB — CBC WITH DIFFERENTIAL/PLATELET
Basophils Absolute: 0.1 10*3/uL (ref 0.0–0.1)
Eosinophils Absolute: 0.1 10*3/uL (ref 0.0–0.7)
HCT: 35.4 % — ABNORMAL LOW (ref 39.0–52.0)
Lymphs Abs: 1 10*3/uL (ref 0.7–4.0)
Monocytes Relative: 9.8 % (ref 3.0–12.0)
Platelets: 229 10*3/uL (ref 150.0–400.0)
RDW: 15.4 % — ABNORMAL HIGH (ref 11.5–14.6)

## 2011-09-23 LAB — HEPATIC FUNCTION PANEL
Alkaline Phosphatase: 66 U/L (ref 39–117)
Bilirubin, Direct: 0.1 mg/dL (ref 0.0–0.3)
Total Bilirubin: 0.8 mg/dL (ref 0.3–1.2)
Total Protein: 7.8 g/dL (ref 6.0–8.3)

## 2011-09-27 ENCOUNTER — Encounter: Payer: Self-pay | Admitting: Internal Medicine

## 2011-09-27 DIAGNOSIS — Z0001 Encounter for general adult medical examination with abnormal findings: Secondary | ICD-10-CM | POA: Insufficient documentation

## 2011-09-27 DIAGNOSIS — Z0000 Encounter for general adult medical examination without abnormal findings: Secondary | ICD-10-CM | POA: Insufficient documentation

## 2011-09-27 DIAGNOSIS — R7302 Impaired glucose tolerance (oral): Secondary | ICD-10-CM | POA: Insufficient documentation

## 2011-09-27 HISTORY — DX: Impaired glucose tolerance (oral): R73.02

## 2011-09-29 ENCOUNTER — Other Ambulatory Visit: Payer: Self-pay | Admitting: Internal Medicine

## 2011-09-30 ENCOUNTER — Encounter: Payer: Self-pay | Admitting: Internal Medicine

## 2011-09-30 ENCOUNTER — Ambulatory Visit (INDEPENDENT_AMBULATORY_CARE_PROVIDER_SITE_OTHER): Payer: Medicare HMO | Admitting: Internal Medicine

## 2011-09-30 ENCOUNTER — Other Ambulatory Visit (INDEPENDENT_AMBULATORY_CARE_PROVIDER_SITE_OTHER): Payer: Medicare HMO

## 2011-09-30 VITALS — BP 122/80 | HR 57 | Temp 97.1°F | Ht 71.0 in | Wt 161.4 lb

## 2011-09-30 DIAGNOSIS — R7309 Other abnormal glucose: Secondary | ICD-10-CM

## 2011-09-30 DIAGNOSIS — R7302 Impaired glucose tolerance (oral): Secondary | ICD-10-CM

## 2011-09-30 DIAGNOSIS — D509 Iron deficiency anemia, unspecified: Secondary | ICD-10-CM | POA: Insufficient documentation

## 2011-09-30 DIAGNOSIS — Z0000 Encounter for general adult medical examination without abnormal findings: Secondary | ICD-10-CM

## 2011-09-30 DIAGNOSIS — E785 Hyperlipidemia, unspecified: Secondary | ICD-10-CM

## 2011-09-30 DIAGNOSIS — Z79899 Other long term (current) drug therapy: Secondary | ICD-10-CM

## 2011-09-30 DIAGNOSIS — D649 Anemia, unspecified: Secondary | ICD-10-CM

## 2011-09-30 DIAGNOSIS — N189 Chronic kidney disease, unspecified: Secondary | ICD-10-CM | POA: Insufficient documentation

## 2011-09-30 HISTORY — DX: Chronic kidney disease, unspecified: N18.9

## 2011-09-30 LAB — FOLATE: Folate: 23.4 ng/mL (ref >5.9)

## 2011-09-30 LAB — IBC PANEL: Saturation Ratios: 12.7 % — ABNORMAL LOW (ref 20.0–50.0)

## 2011-09-30 NOTE — Assessment & Plan Note (Signed)
?   Spurious - for iron/b12 Lab Results  Component Value Date   HGB 11.7* 09/23/2011

## 2011-09-30 NOTE — Progress Notes (Signed)
Subjective:    Patient ID: Gary Levy, male    DOB: [birth year], 71 y.o.   MRN: [id number]  HPI  Here for wellness and f/u;  Overall doing ok;  Pt denies CP, worsening SOB, DOE, wheezing, orthopnea, PND, worsening LE edema, palpitations, dizziness or syncope.  Pt denies neurological change such as new Headache, facial or extremity weakness.  Pt denies polydipsia, polyuria, or low sugar symptoms. Pt states overall good compliance with treatment and medications, good tolerability, and trying to follow lower cholesterol diet.  Pt denies worsening depressive symptoms, suicidal ideation or panic. No fever, wt loss, night sweats, loss of appetite, or other constitutional symptoms.  Pt states good ability with ADL's, low fall risk, home safety reviewed and adequate, no significant changes in hearing or vision, and occasionally active with exercise.   Past Medical History  Diagnosis Date  . CAD (coronary artery disease) of artery bypass graft   . MI (myocardial infarction)     hx  . CHF (congestive heart failure)   . HLD (hyperlipidemia)   . HTN (hypertension)   . Fatigue   . COPD (chronic obstructive pulmonary disease)   . PSA elevation   . ED (erectile dysfunction) of organic origin   . Routine general medical examination at a health care facility   . Glucose intolerance (impaired glucose tolerance)   . Glaucoma   . Moderate or severe vision impairment, both eyes, impairment level not further specified   . Postherpetic neuralgia   . Dysuria   . Shingles   . AR (allergic rhinitis)   . Special screening for malignant neoplasm of prostate   . Urinary frequency   . Skin cancer     hx; non-melanoma  . BPH (benign prostatic hypertrophy)   . Anemia, iron deficiency   . Alcohol abuse     hx  . Crohn's disease   . Impaired glucose tolerance 09/27/2011  . CKD (chronic kidney disease) 09/30/2011   Past Surgical History  Procedure Date  . Coronary artery bypass graft     11+ years ago; has an  EF of about 45%, last Myoview showed no ischemia 12/30/06  . Coronary angioplasty with stent placement   . Right cataract 12/11    with lens implant   . Lih 2011    Dr. Rayburn Ma     reports that he quit smoking about 30 years ago. He has never used smokeless tobacco. He reports that he does not drink alcohol or use illicit drugs. family history includes Heart disease in his mother. No Known Allergies Current Outpatient Prescriptions on File Prior to Visit  Medication Sig Dispense Refill  . aspirin 81 MG EC tablet Take 81 mg by mouth daily.        Marland Kitchen atorvastatin (LIPITOR) 80 MG tablet TAKE 1 TABLET BY MOUTH EVERY DAY  30 tablet  6  . carvedilol (COREG) 6.25 MG tablet TAKE ONE TABLET BY MOUTH TWICE DAILY  60 tablet  0  . DIOVAN HCT 320-25 MG per tablet TAKE 1 TABLET EVERY DAY  30 tablet  8  . Multiple Vitamins-Minerals (CENTRUM PO) Take 1 tablet by mouth daily.        . nitroGLYCERIN (NITROSTAT) 0.4 MG SL tablet Place 0.4 mg under the tongue every 5 (five) minutes as needed.        . pregabalin (LYRICA) 50 MG capsule Take 50 mg by mouth at bedtime.        Marland Kitchen spironolactone (ALDACTONE) 25 MG tablet TAKE 1  TABLET EVERY DAY  30 tablet  6  . tadalafil (CIALIS) 20 MG tablet Take 20 mg by mouth daily as needed.        . traMADol (ULTRAM) 50 MG tablet Take 50 mg by mouth every 6 (six) hours as needed.         Review of Systems Review of Systems  Constitutional: Negative for diaphoresis, activity change, appetite change and unexpected weight change.  HENT: Negative for hearing loss, ear pain, facial swelling, mouth sores and neck stiffness.   Eyes: Negative for pain, redness and visual disturbance.  Respiratory: Negative for shortness of breath and wheezing.   Cardiovascular: Negative for chest pain and palpitations.  Gastrointestinal: Negative for diarrhea, blood in stool, abdominal distention and rectal pain.  Genitourinary: Negative for hematuria, flank pain and decreased urine volume.    Musculoskeletal: Negative for myalgias and joint swelling.  Skin: Negative for color change and wound.  Neurological: Negative for syncope and numbness.  Hematological: Negative for adenopathy.  Psychiatric/Behavioral: Negative for hallucinations, self-injury, decreased concentration and agitation.      Objective:   Physical Exam BP 122/80  Pulse 57  Temp(Src) 97.1 F (36.2 C) (Oral)  Ht 5\' 11"  (1.803 m)  Wt 161 lb 6 oz (73.199 kg)  BMI 22.51 kg/m2  SpO2 98% Physical Exam  VS noted Constitutional: Pt is oriented to person, place, and time. Appears well-developed and well-nourished.  HENT:  Head: Normocephalic and atraumatic.  Right Ear: External ear normal.  Left Ear: External ear normal.  Nose: Nose normal.  Mouth/Throat: Oropharynx is clear and moist.  Eyes: Conjunctivae and EOM are normal. Pupils are equal, round, and reactive to light.  Neck: Normal range of motion. Neck supple. No JVD present. No tracheal deviation present.  Cardiovascular: Normal rate, regular rhythm, normal heart sounds and intact distal pulses.   Pulmonary/Chest: Effort normal and breath sounds normal.  Abdominal: Soft. Bowel sounds are normal. There is no tenderness.  Musculoskeletal: Normal range of motion. Exhibits no edema.  Lymphadenopathy:  Has no cervical adenopathy.  Neurological: Pt is alert and oriented to person, place, and time. Pt has normal reflexes. No cranial nerve deficit.  Skin: Skin is warm and dry. No rash noted.  Psychiatric:  Has  normal mood and affect. Behavior is normal.     Assessment & Plan:

## 2011-09-30 NOTE — Patient Instructions (Addendum)
Continue all other medications as before Please go to LAB in the Basement for the blood and/or urine tests to be done today Please call the phone number [phone] (the [employer] System) for results of testing in 2-3 days;  When calling, simply dial the number, and when prompted enter the MRN number above (the Medical Record Number) and the # key, then the message should start. Please have the pharmacy call if you need medication refills Please return in 6 mo with Lab testing done 3-5 days before

## 2011-09-30 NOTE — Assessment & Plan Note (Signed)
stable overall by hx and exam, most recent data reviewed with pt, and pt to continue medical treatment as before  Lab Results  Component Value Date   HGBA1C 5.4 08/15/2009

## 2011-09-30 NOTE — Assessment & Plan Note (Signed)

## 2011-09-30 NOTE — Assessment & Plan Note (Signed)
stable overall by hx and exam, most recent data reviewed with pt, and pt to continue medical treatment as before  Lab Results  Component Value Date   LDLCALC 78 09/23/2011

## 2011-10-31 ENCOUNTER — Other Ambulatory Visit: Payer: Self-pay | Admitting: Internal Medicine

## 2011-12-02 ENCOUNTER — Other Ambulatory Visit: Payer: Self-pay | Admitting: Internal Medicine

## 2012-03-18 ENCOUNTER — Other Ambulatory Visit: Payer: Self-pay | Admitting: Cardiology

## 2012-03-23 ENCOUNTER — Ambulatory Visit (INDEPENDENT_AMBULATORY_CARE_PROVIDER_SITE_OTHER): Payer: Medicare HMO | Admitting: Cardiology

## 2012-03-23 ENCOUNTER — Encounter: Payer: Self-pay | Admitting: Cardiology

## 2012-03-23 VITALS — BP 116/68 | HR 51 | Ht 71.0 in | Wt 168.0 lb

## 2012-03-23 DIAGNOSIS — I509 Heart failure, unspecified: Secondary | ICD-10-CM

## 2012-03-23 DIAGNOSIS — I5042 Chronic combined systolic (congestive) and diastolic (congestive) heart failure: Secondary | ICD-10-CM

## 2012-03-23 DIAGNOSIS — I25810 Atherosclerosis of coronary artery bypass graft(s) without angina pectoris: Secondary | ICD-10-CM

## 2012-03-23 DIAGNOSIS — R609 Edema, unspecified: Secondary | ICD-10-CM

## 2012-03-23 DIAGNOSIS — I10 Essential (primary) hypertension: Secondary | ICD-10-CM

## 2012-03-23 DIAGNOSIS — E785 Hyperlipidemia, unspecified: Secondary | ICD-10-CM

## 2012-03-23 DIAGNOSIS — I252 Old myocardial infarction: Secondary | ICD-10-CM

## 2012-03-23 DIAGNOSIS — N62 Hypertrophy of breast: Secondary | ICD-10-CM

## 2012-03-23 NOTE — Assessment & Plan Note (Signed)
He is remarkably stable. Would not make any changes in his medication unless his gynecomastia gets worse or painful.

## 2012-03-23 NOTE — Assessment & Plan Note (Signed)
This is a new problem. It is secondary to Spironolactone.  We'll discontinue this worsens or becomes painful.

## 2012-03-23 NOTE — Progress Notes (Signed)
HPI Day for the evaluation and management of his coronary artery disease, history of bypass surgery, old myocardial infarction, chronic mixed congestive heart failure.  He denies any shortness of breath, dyspnea on exertion, increased edema, orthopnea, or PND. He's had no angina.  He has noted some breast swelling on the left side but no pain or drainage. Is actually improved over the last month or so. He is on spironolactone.  Past Medical History  Diagnosis Date  . CAD (coronary artery disease) of artery bypass graft   . MI (myocardial infarction)     hx  . CHF (congestive heart failure)   . HLD (hyperlipidemia)   . HTN (hypertension)   . Fatigue   . COPD (chronic obstructive pulmonary disease)   . PSA elevation   . ED (erectile dysfunction) of organic origin   . Routine general medical examination at a health care facility   . Glucose intolerance (impaired glucose tolerance)   . Glaucoma   . Moderate or severe vision impairment, both eyes, impairment level not further specified   . Postherpetic neuralgia   . Dysuria   . Shingles   . AR (allergic rhinitis)   . Special screening for malignant neoplasm of prostate   . Urinary frequency   . Skin cancer     hx; non-melanoma  . BPH (benign prostatic hypertrophy)   . Anemia, iron deficiency   . Alcohol abuse     hx  . Crohn's disease   . Impaired glucose tolerance 09/27/2011  . CKD (chronic kidney disease) 09/30/2011    Current Outpatient Prescriptions  Medication Sig Dispense Refill  . aspirin 81 MG EC tablet Take 81 mg by mouth daily.        Marland Kitchen atorvastatin (LIPITOR) 80 MG tablet TAKE 1 TABLET BY MOUTH EVERY DAY  30 tablet  6  . carvedilol (COREG) 6.25 MG tablet TAKE ONE TABLET BY MOUTH TWICE DAILY  60 tablet  11  . DIOVAN HCT 320-25 MG per tablet TAKE 1 TABLET EVERY DAY  30 tablet  11  . Multiple Vitamins-Minerals (CENTRUM PO) Take 1 tablet by mouth daily.        . nitroGLYCERIN (NITROSTAT) 0.4 MG SL tablet Place 0.4 mg  under the tongue every 5 (five) minutes as needed.        . pregabalin (LYRICA) 50 MG capsule Take 50 mg by mouth at bedtime.        Marland Kitchen spironolactone (ALDACTONE) 25 MG tablet TAKE ONE TABLET BY MOUTH EVERY DAY  30 tablet  5  . tadalafil (CIALIS) 20 MG tablet Take 20 mg by mouth daily as needed.        . traMADol (ULTRAM) 50 MG tablet Take 50 mg by mouth every 6 (six) hours as needed.          No Known Allergies  Family History  Problem Relation Age of Onset  . Heart disease Mother     History   Social History  . Marital Status: Married    Spouse Name: N/A    Number of Children: N/A  . Years of Education: N/A   Occupational History  . Not on file.   Social History Main Topics  . Smoking status: Former Smoker    Quit date: 03/20/1981  . Smokeless tobacco: Never Used  . Alcohol Use: No  . Drug Use: No  . Sexually Active: Not on file   Other Topics Concern  . 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 [employer]. 01/15/10.     ROS ALL NEGATIVE EXCEPT THOSE NOTED IN HPI  PE  General Appearance: well developed, well nourished in no acute distress HEENT: symmetrical face, PERRLA, good dentition  Neck: no JVD, thyromegaly, or adenopathy, trachea midline Chest: symmetric without deformity, gynecomastia without tenderness or drainage on the left Cardiac: PMI non-displaced, RRR, normal S1, S2, no gallop or murmur Lung: clear to ausculation and percussion Vascular: all pulses full without bruits  Abdominal: nondistended, nontender, good bowel sounds, no HSM, no bruits Extremities: no cyanosis, clubbing or edema, no sign of DVT, no varicosities  Skin: normal color, no rashes Neuro: alert and oriented x 3, non-focal Pysch: normal affect  EKG Sinus bradycardia, old lateral [place] Sporrer infarct. BMET    Component Value Date/Time   NA 140 09/23/2011 1028   K 4.7 09/23/2011 1028   CL 106 09/23/2011 1028     CO2 28 09/23/2011 1028   GLUCOSE 117* 09/23/2011 1028   BUN 25* 09/23/2011 1028   CREATININE 1.7* 09/23/2011 1028   CALCIUM 9.6 09/23/2011 1028   GFRNONAA 43* 07/19/2010 1418   GFRAA  Value: 52        The eGFR has been calculated using the MDRD equation. This calculation has not been validated in all clinical situations. eGFR's persistently <60 mL/min signify possible Chronic Kidney Disease.* 07/19/2010 1418    Lipid Panel     Component Value Date/Time   CHOL 129 09/23/2011 1028   TRIG 43.0 09/23/2011 1028   HDL 42.40 09/23/2011 1028   CHOLHDL 3 09/23/2011 1028   VLDL 8.6 09/23/2011 1028   LDLCALC 78 09/23/2011 1028    CBC    Component Value Date/Time   WBC 4.7 09/23/2011 1028   RBC 4.18* 09/23/2011 1028   HGB 11.7* 09/23/2011 1028   HCT 35.4* 09/23/2011 1028   PLT 229.0 09/23/2011 1028   MCV 84.7 09/23/2011 1028   MCH 28.4 07/19/2010 1418   MCHC 33.1 09/23/2011 1028   RDW 15.4* 09/23/2011 1028   LYMPHSABS 1.0 09/23/2011 1028   MONOABS 0.5 09/23/2011 1028   EOSABS 0.1 09/23/2011 1028   BASOSABS 0.1 09/23/2011 1028

## 2012-03-23 NOTE — Patient Instructions (Addendum)
Your physician recommends that you continue on your current medications as directed. Please refer to the Current Medication list given to you today.  Your physician recommends that you schedule a follow-up appointment in: 1year with Dr. Wall.  

## 2012-03-24 ENCOUNTER — Other Ambulatory Visit (INDEPENDENT_AMBULATORY_CARE_PROVIDER_SITE_OTHER): Payer: Medicare HMO

## 2012-03-24 DIAGNOSIS — R7302 Impaired glucose tolerance (oral): Secondary | ICD-10-CM

## 2012-03-24 DIAGNOSIS — R7309 Other abnormal glucose: Secondary | ICD-10-CM

## 2012-03-24 DIAGNOSIS — E785 Hyperlipidemia, unspecified: Secondary | ICD-10-CM

## 2012-03-24 DIAGNOSIS — N189 Chronic kidney disease, unspecified: Secondary | ICD-10-CM

## 2012-03-24 LAB — LIPID PANEL
Cholesterol: 116 mg/dL (ref 0–200)
Total CHOL/HDL Ratio: 3
Triglycerides: 51 mg/dL (ref 0.0–149.0)

## 2012-03-24 LAB — BASIC METABOLIC PANEL
Chloride: 103 mEq/L (ref 96–112)
Creatinine, Ser: 1.5 mg/dL (ref 0.4–1.5)
Potassium: 4.1 mEq/L (ref 3.5–5.1)
Sodium: 137 mEq/L (ref 135–145)

## 2012-03-24 LAB — HEMOGLOBIN A1C: Hgb A1c MFr Bld: 5.7 % (ref 4.6–6.5)

## 2012-03-24 LAB — MICROALBUMIN / CREATININE URINE RATIO
Creatinine,U: 80.5 mg/dL
Microalb Creat Ratio: 0.5 mg/g (ref 0.0–30.0)
Microalb, Ur: 0.4 mg/dL (ref 0.0–1.9)

## 2012-03-30 ENCOUNTER — Encounter: Payer: Self-pay | Admitting: Internal Medicine

## 2012-03-30 ENCOUNTER — Ambulatory Visit (INDEPENDENT_AMBULATORY_CARE_PROVIDER_SITE_OTHER): Payer: Medicare HMO | Admitting: Internal Medicine

## 2012-03-30 VITALS — BP 112/80 | HR 53 | Temp 97.6°F | Ht 71.0 in | Wt 165.2 lb

## 2012-03-30 DIAGNOSIS — E785 Hyperlipidemia, unspecified: Secondary | ICD-10-CM

## 2012-03-30 DIAGNOSIS — R7309 Other abnormal glucose: Secondary | ICD-10-CM

## 2012-03-30 DIAGNOSIS — R7302 Impaired glucose tolerance (oral): Secondary | ICD-10-CM

## 2012-03-30 DIAGNOSIS — M79642 Pain in left hand: Secondary | ICD-10-CM

## 2012-03-30 DIAGNOSIS — Z0000 Encounter for general adult medical examination without abnormal findings: Secondary | ICD-10-CM

## 2012-03-30 DIAGNOSIS — M79609 Pain in unspecified limb: Secondary | ICD-10-CM

## 2012-03-30 DIAGNOSIS — I10 Essential (primary) hypertension: Secondary | ICD-10-CM

## 2012-03-30 NOTE — Patient Instructions (Addendum)
Continue all other medications as before Please call if you change your mind about the Hand Surgury referral for the left hand Please return in 6 mo with Lab testing done 3-5 days before

## 2012-03-30 NOTE — Assessment & Plan Note (Signed)
?   Joint vs MCP abnormality, delcines film or hand surgury referral, for heat/tylenol prn, consider referral if he changes his mind

## 2012-03-30 NOTE — Progress Notes (Signed)
Subjective:    Patient ID: Gary Levy, male    DOB: Nov 29, 1940, 71 y.o.   MRN: [id number]  HPI   Here to f/u; overall doing ok,  Pt denies chest pain, increased sob or doe, wheezing, orthopnea, PND, increased LE swelling, palpitations, dizziness or syncope.  Pt denies new neurological symptoms such as new headache, or facial or extremity weakness or numbness   Pt denies polydipsia, polyuria, or low sugar symptoms such as weakness or confusion improved with po intake.  Pt states overall good compliance with meds, trying to follow lower cholesterol, diabetic diet, wt overall stable but little exercise however.  Here also with 4th finger left hand pain at the MCP with reduced ROM and pain, but surprisingly little tenderness or swelling- o/w neurovasc intact. For 2 mo.  Denies worsening depressive symptoms, suicidal ideation, or panic.  Pt denies fever, wt loss, night sweats, loss of appetite, or other constitutional symptoms Past Medical History  Diagnosis Date  . CAD (coronary artery disease) of artery bypass graft   . MI (myocardial infarction)     hx  . CHF (congestive heart failure)   . HLD (hyperlipidemia)   . HTN (hypertension)   . Fatigue   . COPD (chronic obstructive pulmonary disease)   . PSA elevation   . ED (erectile dysfunction) of organic origin   . Routine general medical examination at a health care facility   . Glucose intolerance (impaired glucose tolerance)   . Glaucoma   . Moderate or severe vision impairment, both eyes, impairment level not further specified   . Postherpetic neuralgia   . Dysuria   . Shingles   . AR (allergic rhinitis)   . Special screening for malignant neoplasm of prostate   . Urinary frequency   . Skin cancer     hx; non-melanoma  . BPH (benign prostatic hypertrophy)   . Anemia, iron deficiency   . Alcohol abuse     hx  . Crohn's disease   . Impaired glucose tolerance 09/27/2011  . CKD (chronic kidney disease) 09/30/2011   Past Surgical  History  Procedure Date  . Coronary artery bypass graft     11+ years ago; has an EF of about 45%, last Myoview showed no ischemia 12/30/06  . Coronary angioplasty with stent placement   . Right cataract 12/11    with lens implant   . Lih 2011    Dr. Rayburn Ma     reports that he quit smoking about 31 years ago. He has never used smokeless tobacco. He reports that he does not drink alcohol or use illicit drugs. family history includes Heart disease in his mother. No Known Allergies Current Outpatient Prescriptions on File Prior to Visit  Medication Sig Dispense Refill  . aspirin 81 MG EC tablet Take 81 mg by mouth daily.        Marland Kitchen atorvastatin (LIPITOR) 80 MG tablet TAKE 1 TABLET BY MOUTH EVERY DAY  30 tablet  6  . carvedilol (COREG) 6.25 MG tablet TAKE ONE TABLET BY MOUTH TWICE DAILY  60 tablet  11  . DIOVAN HCT 320-25 MG per tablet TAKE 1 TABLET EVERY DAY  30 tablet  11  . Multiple Vitamins-Minerals (CENTRUM PO) Take 1 tablet by mouth daily.        . nitroGLYCERIN (NITROSTAT) 0.4 MG SL tablet Place 0.4 mg under the tongue every 5 (five) minutes as needed.        . pregabalin (LYRICA) 50 MG capsule Take 50 mg  by mouth at bedtime.        Marland Kitchen spironolactone (ALDACTONE) 25 MG tablet TAKE ONE TABLET BY MOUTH EVERY DAY  30 tablet  5  . tadalafil (CIALIS) 20 MG tablet Take 20 mg by mouth daily as needed.        . traMADol (ULTRAM) 50 MG tablet Take 50 mg by mouth every 6 (six) hours as needed.         Review of Systems Review of Systems  Constitutional: Negative for diaphoresis and unexpected weight change.  HENT: Negative for tinnitus.   Eyes: Negative for photophobia and visual disturbance.  Respiratory: Negative for choking and stridor.   Gastrointestinal: Negative for vomiting and blood in stool.  Genitourinary: Negative for hematuria and decreased urine volume.  Musculoskeletal: Negative for gait problem.  Skin: Negative for color change and wound.  Neurological: Negative for tremors  and numbness.  Psychiatric/Behavioral: Negative for decreased concentration. The patient is not hyperactive.      Objective:   Physical Exam BP 112/80  Pulse 53  Temp 97.6 F (36.4 C) (Oral)  Ht 5\' 11"  (1.803 m)  Wt 165 lb 4 oz (74.957 kg)  BMI 23.05 kg/m2  SpO2 97% Physical Exam  VS noted, not ill appearing Constitutional: Pt appears well-developed and well-nourished.  HENT: Head: Normocephalic.  Right Ear: External ear normal.  Left Ear: External ear normal.  Eyes: Conjunctivae and EOM are normal. Pupils are equal, round, and reactive to light.  Neck: Normal range of motion. Neck supple.  Cardiovascular: Normal rate and regular rhythm.   Pulmonary/Chest: Effort normal and breath sounds normal.  Neurological: Pt is alert. Not confused.  Motor/gait intact Skin: Skin is warm. No erythema.  Psychiatric: Pt behavior is normal. Thought content normal.     Assessment & Plan:   f

## 2012-04-04 ENCOUNTER — Encounter: Payer: Self-pay | Admitting: Internal Medicine

## 2012-04-04 NOTE — Assessment & Plan Note (Signed)
stable overall by hx and exam, most recent data reviewed with pt, and pt to continue medical treatment as before BP Readings from Last 3 Encounters:  03/30/12 112/80  03/23/12 116/68  09/30/11 122/80

## 2012-04-04 NOTE — Assessment & Plan Note (Signed)
stable overall by hx and exam, most recent data reviewed with pt, and pt to continue medical treatment as before Lab Results  Component Value Date   LDLCALC 64 03/24/2012

## 2012-04-04 NOTE — Assessment & Plan Note (Signed)
stable overall by hx and exam, most recent data reviewed with pt, and pt to continue medical treatment as before Lab Results  Component Value Date   HGBA1C 5.7 03/24/2012

## 2012-05-04 ENCOUNTER — Telehealth: Payer: Self-pay | Admitting: Internal Medicine

## 2012-05-04 DIAGNOSIS — M79643 Pain in unspecified hand: Secondary | ICD-10-CM

## 2012-05-04 NOTE — Telephone Encounter (Signed)
Done per emr - go [facility], if ok with his insurance

## 2012-05-04 NOTE — Telephone Encounter (Signed)
Pt would like to go ahead with referral for hand surgery, would like to know where to go?

## 2012-05-04 NOTE — Telephone Encounter (Signed)
Called the patient left detailed message of referral and where referral had been made.

## 2012-05-05 ENCOUNTER — Ambulatory Visit (INDEPENDENT_AMBULATORY_CARE_PROVIDER_SITE_OTHER): Payer: Medicare HMO

## 2012-05-05 ENCOUNTER — Telehealth: Payer: Self-pay

## 2012-05-05 DIAGNOSIS — Z23 Encounter for immunization: Secondary | ICD-10-CM

## 2012-05-05 NOTE — Telephone Encounter (Signed)
Already done per emr sept 17, pt should hear from [facility] soon I hope

## 2012-05-05 NOTE — Telephone Encounter (Signed)
Patient stopped by office to get flu vaccine. While here pt c/o continued hand pain and would not like to proceed with referral to hand surgeon. Thanks

## 2012-05-16 ENCOUNTER — Other Ambulatory Visit: Payer: Self-pay | Admitting: Cardiology

## 2012-05-21 ENCOUNTER — Other Ambulatory Visit: Payer: Self-pay | Admitting: Cardiology

## 2012-10-01 ENCOUNTER — Ambulatory Visit: Payer: Medicare HMO | Admitting: Internal Medicine

## 2012-10-04 ENCOUNTER — Other Ambulatory Visit: Payer: Self-pay | Admitting: Cardiology

## 2012-10-08 ENCOUNTER — Other Ambulatory Visit (INDEPENDENT_AMBULATORY_CARE_PROVIDER_SITE_OTHER): Payer: Medicare HMO

## 2012-10-08 ENCOUNTER — Encounter: Payer: Self-pay | Admitting: Internal Medicine

## 2012-10-08 ENCOUNTER — Ambulatory Visit (INDEPENDENT_AMBULATORY_CARE_PROVIDER_SITE_OTHER): Payer: Medicare HMO | Admitting: Internal Medicine

## 2012-10-08 VITALS — BP 122/72 | HR 58 | Temp 97.7°F | Ht 71.0 in | Wt 171.0 lb

## 2012-10-08 DIAGNOSIS — Z0000 Encounter for general adult medical examination without abnormal findings: Secondary | ICD-10-CM

## 2012-10-08 LAB — BASIC METABOLIC PANEL
BUN: 20 mg/dL (ref 6–23)
Calcium: 9.6 mg/dL (ref 8.4–10.5)
GFR: 57.83 mL/min — ABNORMAL LOW (ref >60.00)
Glucose, Bld: 100 mg/dL — ABNORMAL HIGH (ref 70–99)
Sodium: 138 mEq/L (ref 135–145)

## 2012-10-08 LAB — HEPATIC FUNCTION PANEL: Albumin: 3.7 g/dL (ref 3.5–5.2)

## 2012-10-08 LAB — CBC WITH DIFFERENTIAL/PLATELET
Eosinophils Relative: 3.6 % (ref 0.0–5.0)
HCT: 34.1 % — ABNORMAL LOW (ref 39.0–52.0)
Hemoglobin: 11.2 g/dL — ABNORMAL LOW (ref 13.0–17.0)
Lymphs Abs: 1 10*3/uL (ref 0.7–4.0)
Monocytes Relative: 15.3 % — ABNORMAL HIGH (ref 3.0–12.0)
Neutro Abs: 2.3 10*3/uL (ref 1.4–7.7)
RBC: 4 Mil/uL — ABNORMAL LOW (ref 4.22–5.81)
WBC: 4.2 10*3/uL — ABNORMAL LOW (ref 4.5–10.5)

## 2012-10-08 LAB — URINALYSIS, ROUTINE W REFLEX MICROSCOPIC
Ketones, ur: NEGATIVE
Leukocytes, UA: NEGATIVE
Specific Gravity, Urine: 1.02 (ref 1.000–1.030)
Urine Glucose: NEGATIVE
pH: 6 (ref 5.0–8.0)

## 2012-10-08 LAB — PSA: PSA: 3.86 ng/mL (ref 0.10–4.00)

## 2012-10-08 LAB — LIPID PANEL
Cholesterol: 114 mg/dL (ref 0–200)
HDL: 37.8 mg/dL — ABNORMAL LOW (ref >39.00)
VLDL: 9.2 mg/dL (ref 0.0–40.0)

## 2012-10-08 LAB — HEMOGLOBIN A1C: Hgb A1c MFr Bld: 5.6 % (ref 4.6–6.5)

## 2012-10-08 LAB — TSH: TSH: 1.47 u[IU]/mL (ref 0.35–5.50)

## 2012-10-08 NOTE — Progress Notes (Signed)
Subjective:    Patient ID: Gary Levy, male    DOB: 1941/05/16, 72 y.o.   MRN: [id number]  HPI Here for wellness and f/u;  Overall doing ok;  Pt denies CP, worsening SOB, DOE, wheezing, orthopnea, PND, worsening LE edema, palpitations, dizziness or syncope.  Pt denies neurological change such as new headache, facial or extremity weakness.  Pt denies polydipsia, polyuria, or low sugar symptoms. Pt states overall good compliance with treatment and medications, good tolerability, and has been trying to follow lower cholesterol diet.  Pt denies worsening depressive symptoms, suicidal ideation or panic. No fever, night sweats, wt loss, loss of appetite, or other constitutional symptoms.  Pt states good ability with ADL's, has low fall risk, home safety reviewed and adequate, no other significant changes in hearing or vision, and only occasionally active with exercise.  No new complaints Past Medical History  Diagnosis Date  . CAD (coronary artery disease) of artery bypass graft   . MI (myocardial infarction)     hx  . CHF (congestive heart failure)   . HLD (hyperlipidemia)   . HTN (hypertension)   . Fatigue   . COPD (chronic obstructive pulmonary disease)   . PSA elevation   . ED (erectile dysfunction) of organic origin   . Routine general medical examination at a health care facility   . Glucose intolerance (impaired glucose tolerance)   . Glaucoma(365)   . Moderate or severe vision impairment, both eyes, impairment level not further specified   . Postherpetic neuralgia   . Dysuria   . Shingles   . AR (allergic rhinitis)   . Special screening for malignant neoplasm of prostate   . Urinary frequency   . Skin cancer     hx; non-melanoma  . BPH (benign prostatic hypertrophy)   . Anemia, iron deficiency   . Alcohol abuse     hx  . Crohn's disease   . Impaired glucose tolerance 09/27/2011  . CKD (chronic kidney disease) 09/30/2011   Past Surgical History  Procedure Laterality Date  .  Coronary artery bypass graft      11+ years ago; has an EF of about 45%, last Myoview showed no ischemia 12/30/06  . Coronary angioplasty with stent placement    . Right cataract  12/11    with lens implant   . Lih  2011    Dr. Rayburn Ma     reports that he quit smoking about 31 years ago. He has never used smokeless tobacco. He reports that he does not drink alcohol or use illicit drugs. family history includes Heart disease in his mother. No Known Allergies Current Outpatient Prescriptions on File Prior to Visit  Medication Sig Dispense Refill  . aspirin 81 MG EC tablet Take 81 mg by mouth daily.        Marland Kitchen atorvastatin (LIPITOR) 80 MG tablet TAKE 1 TABLET BY MOUTH EVERY DAY  30 tablet  11  . atorvastatin (LIPITOR) 80 MG tablet TAKE 1 TABLET BY MOUTH EVERY DAY  30 tablet  6  . carvedilol (COREG) 6.25 MG tablet TAKE ONE TABLET BY MOUTH TWICE DAILY  60 tablet  11  . DIOVAN HCT 320-25 MG per tablet TAKE 1 TABLET EVERY DAY  30 tablet  11  . Multiple Vitamins-Minerals (CENTRUM PO) Take 1 tablet by mouth daily.        . nitroGLYCERIN (NITROSTAT) 0.4 MG SL tablet Place 0.4 mg under the tongue every 5 (five) minutes as needed.        Marland Kitchen  pregabalin (LYRICA) 50 MG capsule Take 50 mg by mouth at bedtime.        Marland Kitchen spironolactone (ALDACTONE) 25 MG tablet TAKE ONE TABLET BY MOUTH EVERY DAY  30 tablet  8  . traMADol (ULTRAM) 50 MG tablet Take 50 mg by mouth every 6 (six) hours as needed.         No current facility-administered medications on file prior to visit.   Review of Systems Constitutional: Negative for diaphoresis, activity change, appetite change or unexpected weight change.  HENT: Negative for hearing loss, ear pain, facial swelling, mouth sores and neck stiffness.   Eyes: Negative for pain, redness and visual disturbance.  Respiratory: Negative for shortness of breath and wheezing.   Cardiovascular: Negative for chest pain and palpitations.  Gastrointestinal: Negative for diarrhea, blood in  stool, abdominal distention or other pain Genitourinary: Negative for hematuria, flank pain or change in urine volume.  Musculoskeletal: Negative for myalgias and joint swelling.  Skin: Negative for color change and wound.  Neurological: Negative for syncope and numbness. other than noted Hematological: Negative for adenopathy.  Psychiatric/Behavioral: Negative for hallucinations, self-injury, decreased concentration and agitation.      Objective:   Physical Exam BP 122/72  Pulse 58  Temp(Src) 97.7 F (36.5 C) (Oral)  Ht 5\' 11"  (1.803 m)  Wt 171 lb (77.565 kg)  BMI 23.86 kg/m2  SpO2 97% VS noted,  Constitutional: Pt is oriented to person, place, and time. Appears well-developed and well-nourished.  Head: Normocephalic and atraumatic.  Right Ear: External ear normal.  Left Ear: External ear normal.  Nose: Nose normal.  Mouth/Throat: Oropharynx is clear and moist.  Eyes: Conjunctivae and EOM are normal. Pupils are equal, round, and reactive to light.  Neck: Normal range of motion. Neck supple. No JVD present. No tracheal deviation present.  Cardiovascular: Normal rate, regular rhythm, normal heart sounds and intact distal pulses.   Pulmonary/Chest: Effort normal and breath sounds normal.  Abdominal: Soft. Bowel sounds are normal. There is no tenderness. No HSM  Musculoskeletal: Normal range of motion. Exhibits no edema.  Lymphadenopathy:  Has no cervical adenopathy.  Neurological: Pt is alert and oriented to person, place, and time. Pt has normal reflexes. No cranial nerve deficit.  Skin: Skin is warm and dry. No rash noted.  Psychiatric:  Has  normal mood and affect. Behavior is normal.     Assessment & Plan:

## 2012-10-08 NOTE — Patient Instructions (Addendum)
Please continue all other medications as before, and refills have been done if requested. Please go to the LAB in the Basement (turn left off the elevator) for the tests to be done today You will be contacted by phone if any changes need to be made immediately.  Otherwise, you will receive a letter about your results with an explanation, but please check with MyChart first. Thank you for enrolling in MyChart. Please follow the instructions below to securely access your online medical record. MyChart allows you to send messages to your doctor, view your test results, renew your prescriptions, schedule appointments, and more. To Log into My Chart online, please go by [employer] or [employer] to [email or web address], or download the MyChart App from the [employer] of [employer] .  Your Username is: [email or web address] (pass champ) Please return in 6 months, or sooner if needed, with Lab testing done 3-5 days before

## 2012-10-09 NOTE — Assessment & Plan Note (Signed)
stable overall by history and exam, recent data reviewed with pt, and pt to continue medical treatment as before,  to f/u any worsening symptoms or concerns Lab Results  Component Value Date   HGBA1C 5.6 10/08/2012

## 2012-10-09 NOTE — Assessment & Plan Note (Signed)

## 2012-11-08 ENCOUNTER — Other Ambulatory Visit: Payer: Self-pay | Admitting: Internal Medicine

## 2012-12-07 ENCOUNTER — Other Ambulatory Visit: Payer: Self-pay | Admitting: Internal Medicine

## 2013-04-08 ENCOUNTER — Ambulatory Visit (INDEPENDENT_AMBULATORY_CARE_PROVIDER_SITE_OTHER)
Admission: RE | Admit: 2013-04-08 | Discharge: 2013-04-08 | Disposition: A | Discharge status: Home / Self Care | Payer: Medicare HMO | Source: Ambulatory Visit | Attending: Internal Medicine | Admitting: Internal Medicine

## 2013-04-08 ENCOUNTER — Encounter: Payer: Self-pay | Admitting: Internal Medicine

## 2013-04-08 ENCOUNTER — Ambulatory Visit (INDEPENDENT_AMBULATORY_CARE_PROVIDER_SITE_OTHER): Payer: Medicare HMO | Admitting: Internal Medicine

## 2013-04-08 VITALS — BP 112/70 | HR 65 | Temp 97.9°F | Ht 71.0 in | Wt 171.5 lb

## 2013-04-08 DIAGNOSIS — Z0000 Encounter for general adult medical examination without abnormal findings: Secondary | ICD-10-CM

## 2013-04-08 DIAGNOSIS — R7302 Impaired glucose tolerance (oral): Secondary | ICD-10-CM

## 2013-04-08 DIAGNOSIS — R05 Cough: Secondary | ICD-10-CM

## 2013-04-08 DIAGNOSIS — J309 Allergic rhinitis, unspecified: Secondary | ICD-10-CM

## 2013-04-08 DIAGNOSIS — R7309 Other abnormal glucose: Secondary | ICD-10-CM

## 2013-04-08 MED ORDER — FLUTICASONE PROPIONATE 50 MCG/ACT NA SUSP: 2.0000 | Freq: Every day | NASAL | Status: DC

## 2013-04-08 MED ORDER — METHYLPREDNISOLONE ACETATE 80 MG/ML IJ SUSP
80.0000 mg | Freq: Once | INTRAMUSCULAR | Status: AC
  Administered 2013-04-08: 80 mg via INTRAMUSCULAR

## 2013-04-08 MED ORDER — FEXOFENADINE HCL 180 MG PO TABS: 180.0000 mg | ORAL_TABLET | Freq: Every day | ORAL | Status: DC

## 2013-04-08 NOTE — Patient Instructions (Signed)
You had the steroid shot today  Please take all new medication as prescribed - the prednisone (for just a short time), and allegra and flonase for the allergies  Please go to the XRAY Department in the Basement (go straight as you get off the elevator) for the x-ray testing  You will be contacted by phone if any changes need to be made immediately.  Otherwise, you will receive a letter about your results with an explanation, but please check with MyChart first.  Please continue all other medications as before, and refills have been done if requested.  Please keep your appointments with your specialists as you have planned  - Cardiology  Please return in 6 months, or sooner if needed, with Lab testing done 3-5 days before

## 2013-04-08 NOTE — Progress Notes (Signed)
Subjective:    Patient ID: Gary Levy, male    DOB: 09/29/1940, 72 y.o.   MRN: [id number]  HPI  Here to f/u  - C/o recurrent cough with clear mucous, worse with lying on the left side at night, seems to have a funny feeling and tingling to left chest as well after the cough. Does have several wks ongoing nasal allergy symptoms with clearish congestion, itch and sneezing, without fever, pain, ST, cough, swelling or wheezing except for worsening cough in last few nights that kept him up from sleep as above. Pt denies chest pain, increased sob or doe, wheezing, orthopnea, PND, increased LE swelling, palpitations, dizziness or syncope.   Pt denies polydipsia, polyuria,   Past Medical History  Diagnosis Date  . CAD (coronary artery disease) of artery bypass graft   . MI (myocardial infarction)     hx  . CHF (congestive heart failure)   . HLD (hyperlipidemia)   . HTN (hypertension)   . Fatigue   . COPD (chronic obstructive pulmonary disease)   . PSA elevation   . ED (erectile dysfunction) of organic origin   . Routine general medical examination at a health care facility   . Glucose intolerance (impaired glucose tolerance)   . Glaucoma   . Moderate or severe vision impairment, both eyes, impairment level not further specified   . Postherpetic neuralgia   . Dysuria   . Shingles   . AR (allergic rhinitis)   . Special screening for malignant neoplasm of prostate   . Urinary frequency   . Skin cancer     hx; non-melanoma  . BPH (benign prostatic hypertrophy)   . Anemia, iron deficiency   . Alcohol abuse     hx  . Crohn's disease   . Impaired glucose tolerance 09/27/2011  . CKD (chronic kidney disease) 09/30/2011   Past Surgical History  Procedure Laterality Date  . Coronary artery bypass graft      11+ years ago; has an EF of about 45%, last Myoview showed no ischemia 12/30/06  . Coronary angioplasty with stent placement    . Right cataract  12/11    with lens implant   . Lih  2011     Dr. Rayburn Ma     reports that he quit smoking about 32 years ago. He has never used smokeless tobacco. He reports that he does not drink alcohol or use illicit drugs. family history includes Heart disease in his mother. No Known Allergies Current Outpatient Prescriptions on File Prior to Visit  Medication Sig Dispense Refill  . aspirin 81 MG EC tablet Take 81 mg by mouth daily.        Marland Kitchen atorvastatin (LIPITOR) 80 MG tablet TAKE 1 TABLET BY MOUTH EVERY DAY  30 tablet  11  . atorvastatin (LIPITOR) 80 MG tablet TAKE 1 TABLET BY MOUTH EVERY DAY  30 tablet  6  . carvedilol (COREG) 6.25 MG tablet TAKE ONE TABLET BY MOUTH TWICE DAILY  60 tablet  11  . Multiple Vitamins-Minerals (CENTRUM PO) Take 1 tablet by mouth daily.        . nitroGLYCERIN (NITROSTAT) 0.4 MG SL tablet Place 0.4 mg under the tongue every 5 (five) minutes as needed.        . pregabalin (LYRICA) 50 MG capsule Take 50 mg by mouth at bedtime.        Marland Kitchen spironolactone (ALDACTONE) 25 MG tablet TAKE ONE TABLET BY MOUTH EVERY DAY  30 tablet  8  .  traMADol (ULTRAM) 50 MG tablet Take 50 mg by mouth every 6 (six) hours as needed.        . valsartan-hydrochlorothiazide (DIOVAN-HCT) 320-25 MG per tablet TAKE 1 TABLET EVERY DAY  30 tablet  10   No current facility-administered medications on file prior to visit.    Review of Systems  Constitutional: Negative for unexpected weight change, or unusual diaphoresis  HENT: Negative for tinnitus.   Eyes: Negative for photophobia and visual disturbance.  Respiratory: Negative for choking and stridor.   Gastrointestinal: Negative for vomiting and blood in stool.  Genitourinary: Negative for hematuria and decreased urine volume.  Musculoskeletal: Negative for acute joint swelling Skin: Negative for color change and wound.  Neurological: Negative for tremors and numbness other than noted  Psychiatric/Behavioral: Negative for decreased concentration or  hyperactivity.       Objective:    Physical Exam BP 112/70  Pulse 65  Temp(Src) 97.9 F (36.6 C) (Oral)  Ht 5\' 11"  (1.803 m)  Wt 171 lb 8 oz (77.792 kg)  BMI 23.93 kg/m2  SpO2 98% VS noted,  Constitutional: Pt appears well-developed and well-nourished.  HENT: Head: NCAT.  Right Ear: External ear normal.  Left Ear: External ear normal.  Eyes: Conjunctivae and EOM are normal. Pupils are equal, round, and reactive to light.  Bilat tm's with mild erythema.  Max sinus areas non tender.  Pharynx with mild erythema, no exudate Neck: Normal range of motion. Neck supple.  Cardiovascular: Normal rate and regular rhythm.   Pulmonary/Chest: Effort normal and breath sounds normal.  Neurological: Pt is alert. Not confused  Skin: Skin is warm. No erythema.  Psychiatric: Pt behavior is normal. Thought content normal.     Assessment & Plan:

## 2013-04-09 NOTE — Assessment & Plan Note (Signed)
Also for cxr , r/o other such as pna

## 2013-04-09 NOTE — Assessment & Plan Note (Signed)
stable overall by history and exam, recent data reviewed with pt, and pt to continue medical treatment as before,  to f/u any worsening symptoms or concerns Lab Results  Component Value Date   HGBA1C 5.6 10/08/2012

## 2013-04-09 NOTE — Assessment & Plan Note (Signed)
Mild to mod, for depomedrol IM, allegra/flonaseasd,  to f/u any worsening symptoms or concerns 

## 2013-05-20 ENCOUNTER — Ambulatory Visit: Payer: Medicare HMO | Admitting: Cardiovascular Disease

## 2013-06-07 ENCOUNTER — Ambulatory Visit (INDEPENDENT_AMBULATORY_CARE_PROVIDER_SITE_OTHER): Payer: Medicare HMO

## 2013-06-07 ENCOUNTER — Other Ambulatory Visit: Payer: Self-pay | Admitting: Cardiology

## 2013-06-07 DIAGNOSIS — Z23 Encounter for immunization: Secondary | ICD-10-CM

## 2013-07-21 ENCOUNTER — Telehealth: Payer: Self-pay | Admitting: Internal Medicine

## 2013-07-21 MED ORDER — CARVEDILOL 6.25 MG PO TABS: 6.2500 mg | ORAL_TABLET | Freq: Two times a day (BID) | ORAL | Status: DC

## 2013-07-21 NOTE — Telephone Encounter (Signed)
Refill sent patient informed.  

## 2013-07-21 NOTE — Telephone Encounter (Signed)
To robin to consider

## 2013-07-21 NOTE — Telephone Encounter (Signed)
Pt request refill for Carvedilol 6.25 mg.

## 2013-07-22 ENCOUNTER — Other Ambulatory Visit: Payer: Self-pay

## 2013-07-22 MED ORDER — SPIRONOLACTONE 25 MG PO TABS: ORAL_TABLET | ORAL | Status: DC

## 2013-07-28 ENCOUNTER — Ambulatory Visit (INDEPENDENT_AMBULATORY_CARE_PROVIDER_SITE_OTHER): Payer: Medicare HMO | Admitting: Cardiovascular Disease

## 2013-07-28 ENCOUNTER — Encounter: Payer: Self-pay | Admitting: Cardiovascular Disease

## 2013-07-28 ENCOUNTER — Ambulatory Visit (HOSPITAL_COMMUNITY): Payer: Medicare HMO | Attending: Cardiovascular Disease | Admitting: Radiology

## 2013-07-28 VITALS — BP 137/70 | HR 84 | Ht 71.0 in | Wt 167.0 lb

## 2013-07-28 DIAGNOSIS — R079 Chest pain, unspecified: Secondary | ICD-10-CM

## 2013-07-28 DIAGNOSIS — E785 Hyperlipidemia, unspecified: Secondary | ICD-10-CM

## 2013-07-28 DIAGNOSIS — I252 Old myocardial infarction: Secondary | ICD-10-CM | POA: Insufficient documentation

## 2013-07-28 DIAGNOSIS — I5022 Chronic systolic (congestive) heart failure: Secondary | ICD-10-CM | POA: Insufficient documentation

## 2013-07-28 DIAGNOSIS — I2510 Atherosclerotic heart disease of native coronary artery without angina pectoris: Secondary | ICD-10-CM | POA: Insufficient documentation

## 2013-07-28 DIAGNOSIS — I359 Nonrheumatic aortic valve disorder, unspecified: Secondary | ICD-10-CM | POA: Insufficient documentation

## 2013-07-28 DIAGNOSIS — R609 Edema, unspecified: Secondary | ICD-10-CM | POA: Insufficient documentation

## 2013-07-28 DIAGNOSIS — I255 Ischemic cardiomyopathy: Secondary | ICD-10-CM

## 2013-07-28 DIAGNOSIS — I5042 Chronic combined systolic (congestive) and diastolic (congestive) heart failure: Secondary | ICD-10-CM

## 2013-07-28 DIAGNOSIS — I509 Heart failure, unspecified: Secondary | ICD-10-CM

## 2013-07-28 DIAGNOSIS — Z951 Presence of aortocoronary bypass graft: Secondary | ICD-10-CM | POA: Insufficient documentation

## 2013-07-28 DIAGNOSIS — J4489 Other specified chronic obstructive pulmonary disease: Secondary | ICD-10-CM | POA: Insufficient documentation

## 2013-07-28 DIAGNOSIS — I2589 Other forms of chronic ischemic heart disease: Secondary | ICD-10-CM

## 2013-07-28 DIAGNOSIS — J449 Chronic obstructive pulmonary disease, unspecified: Secondary | ICD-10-CM | POA: Insufficient documentation

## 2013-07-28 DIAGNOSIS — I10 Essential (primary) hypertension: Secondary | ICD-10-CM

## 2013-07-28 DIAGNOSIS — I129 Hypertensive chronic kidney disease with stage 1 through stage 4 chronic kidney disease, or unspecified chronic kidney disease: Secondary | ICD-10-CM | POA: Insufficient documentation

## 2013-07-28 DIAGNOSIS — I079 Rheumatic tricuspid valve disease, unspecified: Secondary | ICD-10-CM | POA: Insufficient documentation

## 2013-07-28 DIAGNOSIS — N189 Chronic kidney disease, unspecified: Secondary | ICD-10-CM | POA: Insufficient documentation

## 2013-07-28 NOTE — Progress Notes (Signed)
Echocardiogram performed.  

## 2013-07-28 NOTE — Patient Instructions (Signed)
Your physician recommends that you schedule a follow-up appointment in:  4-6 weeks.   Your physician has requested that you have an echocardiogram. Echocardiography is a painless test that uses sound waves to create images of your heart. It provides your doctor with information about the size and shape of your heart and how well your heart's chambers and valves are working. This procedure takes approximately one hour. There are no restrictions for this procedure.  Your physician has requested that you have a lexiscan myoview. For further information please visit [email or web address]. Please follow instruction sheet, as given.   

## 2013-07-28 NOTE — Progress Notes (Signed)
History of Present Illness: 72 yo male with history of CAD s/p 4V CABG in 1995, ischemic cardiomyopathy, HTN, HLD, COPD, CKD here today for cardiac follow up. He has been followed in the past by Dr. Daleen Squibb. He has CAD dating back to the 1990s with prior PCI and CABG. The last cath report I can find is scanned into EPIC from 1999 and shows failure of the LIMA graft to the LAD, the SVG to the RCA. There was high grade disease in the SVG to the OM and the SVG to the Diagonal. It appears that the LAD was treated with rotablator atherectomy and stenting in 1999. The SVG to the OM and the SVG to the OM was treated with angioplasty. Most recent stress test in 2008. Echo 2011 with LVEF=35-40%.   He is here today for follow up. He has been having exertional chest pains when walking the dog. Some SOB. This occurs several times per week. The pains are severe and last for several minutes. Resolves with rest.   Primary Care Physician: Oliver Barre  Last Lipid Profile:Lipid Panel     Component Value Date/Time   CHOL 114 10/08/2012 1146   TRIG 46.0 10/08/2012 1146   HDL 37.80* 10/08/2012 1146   CHOLHDL 3 10/08/2012 1146   VLDL 9.2 10/08/2012 1146   LDLCALC 67 10/08/2012 1146    Past Medical History  Diagnosis Date  . CAD (coronary artery disease) of artery bypass graft   . MI (myocardial infarction)     hx  . CHF (congestive heart failure)   . HLD (hyperlipidemia)   . HTN (hypertension)   . Fatigue   . COPD (chronic obstructive pulmonary disease)   . PSA elevation   . ED (erectile dysfunction) of organic origin   . Routine general medical examination at a health care facility   . Glucose intolerance (impaired glucose tolerance)   . Glaucoma   . Moderate or severe vision impairment, both eyes, impairment level not further specified   . Postherpetic neuralgia   . Dysuria   . Shingles   . AR (allergic rhinitis)   . Special screening for malignant neoplasm of prostate   . Urinary frequency   . Skin  cancer     hx; non-melanoma  . BPH (benign prostatic hypertrophy)   . Anemia, iron deficiency   . Alcohol abuse     hx  . Crohn's disease   . Impaired glucose tolerance 09/27/2011  . CKD (chronic kidney disease) 09/30/2011    Past Surgical History  Procedure Laterality Date  . Coronary artery bypass graft      11+ years ago; has an EF of about 45%, last Myoview showed no ischemia 12/30/06  . Coronary angioplasty with stent placement    . Right cataract  12/11    with lens implant   . Lih  2011    Dr. Rayburn Ma     Current Outpatient Prescriptions  Medication Sig Dispense Refill  . aspirin 81 MG EC tablet Take 81 mg by mouth daily.        Marland Kitchen atorvastatin (LIPITOR) 80 MG tablet TAKE 1 TABLET BY MOUTH EVERY DAY  30 tablet  6  . atorvastatin (LIPITOR) 80 MG tablet TAKE 1 TABLET BY MOUTH EVERY DAY  30 tablet  1  . carvedilol (COREG) 6.25 MG tablet Take 1 tablet (6.25 mg total) by mouth 2 (two) times daily with a meal.  60 tablet  11  . fexofenadine (ALLEGRA) 180 MG  tablet Take 1 tablet (180 mg total) by mouth daily.  30 tablet  11  . fluticasone (FLONASE) 50 MCG/ACT nasal spray Place 2 sprays into the nose daily.  16 g  5  . Multiple Vitamins-Minerals (CENTRUM PO) Take 1 tablet by mouth daily.        . nitroGLYCERIN (NITROSTAT) 0.4 MG SL tablet Place 0.4 mg under the tongue every 5 (five) minutes as needed.        . pregabalin (LYRICA) 50 MG capsule Take 50 mg by mouth at bedtime.        Marland Kitchen spironolactone (ALDACTONE) 25 MG tablet TAKE ONE TABLET BY MOUTH EVERY DAY  90 tablet  2  . traMADol (ULTRAM) 50 MG tablet Take 50 mg by mouth every 6 (six) hours as needed.        . valsartan-hydrochlorothiazide (DIOVAN-HCT) 320-25 MG per tablet TAKE 1 TABLET EVERY DAY  30 tablet  10   No current facility-administered medications for this visit.    No Known Allergies  History   Social History  . Marital Status: Married    Spouse Name: N/A    Number of Children: N/A  . Years of Education: N/A     Occupational History  . Not on file.   Social History Main Topics  . Smoking status: Former Smoker    Quit date: 03/20/1981  . Smokeless tobacco: Never Used  . Alcohol Use: No  . Drug Use: No  . Sexual Activity: Not on file   Other Topics Concern  . Not on file   Social History Narrative   Married, 4 children; retired - Arts development officer.      Designated party release on file. [employer]. 01/15/10.     Family History  Problem Relation Age of Onset  . Heart disease Mother     Review of Systems:  As stated in the HPI and otherwise negative.   BP 137/70  Pulse 84  Ht 5\' 11"  (1.803 m)  Wt 167 lb (75.751 kg)  BMI 23.30 kg/m2  Physical Examination: General: Well developed, well nourished, NAD HEENT: OP clear, mucus membranes moist SKIN: warm, dry. No rashes. Neuro: No focal deficits Musculoskeletal: Muscle strength 5/5 all ext Psychiatric: Mood and affect normal Neck: No JVD, no carotid bruits, no thyromegaly, no lymphadenopathy. Lungs:Clear bilaterally, no wheezes, rhonci, crackles Cardiovascular: Regular rate and rhythm. No murmurs, gallops or rubs. Abdomen:Soft. Bowel sounds present. Non-tender.  Extremities: No lower extremity edema. Pulses are 2 + in the bilateral DP/PT.  EKG: NSR, rate 71 bpm. Non-specific ST abnormality.   Echo 03/26/10: Left ventricle: The EF is in the 35-40% range. There is mild global hypokinesis with more marked hypokinesis of the inferior and posterior walls. - Right ventricle: The cavity size was mildly dilated. Systolic function was mildly reduced. - Right atrium: The atrium was mildly dilated.  Assessment and Plan:   1. CAD: s/p CABG. Recent chest pains c/w unstable angina, class III. He is on good medications. Will arrange Lexiscan stress myoview to exclude ischemia. He is asked to seek immediate medical attention if he has chest pain that does not resolve with rest and NTG.   2. HTN: BP controlled. No changes.   3.  Hyperlipidemia: He is on a statin. Lipids well controlled. NO changes.   4. Ischemic cardiomyopathy: Last LVEF=35-40% by echo 2011. Will repeat echo to assess LVEF.   5. Chest pain: see above. Plans for stress test.   6. Chronic systolic CHF: Volume status is  stable today. No changes.   30 minutes spent today reviewing records, old cath reports, echo reports, EKG.

## 2013-07-29 ENCOUNTER — Telehealth: Payer: Self-pay | Admitting: Cardiovascular Disease

## 2013-07-29 NOTE — Telephone Encounter (Signed)
Message copied by Lendon Ka on Fri Jul 29, 2013 10:04 AM ------      Message from: Verne Carrow D      Created: Thu Jul 28, 2013  4:09 PM       LVEF is unchanged, 35%. NO significant valve issues. cdm ------

## 2013-07-29 NOTE — Telephone Encounter (Signed)
New message    Returned Pat's call from yesterday

## 2013-08-01 ENCOUNTER — Other Ambulatory Visit: Payer: Self-pay | Admitting: Cardiology

## 2013-08-15 ENCOUNTER — Encounter: Payer: Self-pay | Admitting: Cardiovascular Disease

## 2013-08-17 ENCOUNTER — Ambulatory Visit (HOSPITAL_COMMUNITY): Payer: Medicare HMO | Attending: Cardiology | Admitting: Radiology

## 2013-08-17 ENCOUNTER — Encounter: Payer: Self-pay | Admitting: Cardiology

## 2013-08-17 VITALS — BP 137/66 | HR 48 | Ht 71.0 in | Wt 162.0 lb

## 2013-08-17 DIAGNOSIS — Z951 Presence of aortocoronary bypass graft: Secondary | ICD-10-CM | POA: Insufficient documentation

## 2013-08-17 DIAGNOSIS — Z9861 Coronary angioplasty status: Secondary | ICD-10-CM | POA: Insufficient documentation

## 2013-08-17 DIAGNOSIS — R079 Chest pain, unspecified: Secondary | ICD-10-CM

## 2013-08-17 DIAGNOSIS — I252 Old myocardial infarction: Secondary | ICD-10-CM | POA: Insufficient documentation

## 2013-08-17 DIAGNOSIS — Z8249 Family history of ischemic heart disease and other diseases of the circulatory system: Secondary | ICD-10-CM | POA: Insufficient documentation

## 2013-08-17 DIAGNOSIS — I2510 Atherosclerotic heart disease of native coronary artery without angina pectoris: Secondary | ICD-10-CM | POA: Insufficient documentation

## 2013-08-17 DIAGNOSIS — I10 Essential (primary) hypertension: Secondary | ICD-10-CM | POA: Insufficient documentation

## 2013-08-17 DIAGNOSIS — Z87891 Personal history of nicotine dependence: Secondary | ICD-10-CM | POA: Insufficient documentation

## 2013-08-17 DIAGNOSIS — R0789 Other chest pain: Secondary | ICD-10-CM | POA: Insufficient documentation

## 2013-08-17 DIAGNOSIS — I779 Disorder of arteries and arterioles, unspecified: Secondary | ICD-10-CM | POA: Insufficient documentation

## 2013-08-17 MED ORDER — REGADENOSON 0.4 MG/5ML IV SOLN
0.4000 mg | Freq: Once | INTRAVENOUS | Status: AC
  Administered 2013-08-17: 0.4 mg via INTRAVENOUS

## 2013-08-17 MED ORDER — TECHNETIUM TC 99M SESTAMIBI GENERIC - CARDIOLITE
33.0000 | Freq: Once | INTRAVENOUS | Status: AC | PRN
  Administered 2013-08-17: 33 via INTRAVENOUS

## 2013-08-17 MED ORDER — TECHNETIUM TC 99M SESTAMIBI GENERIC - CARDIOLITE
10.8000 | Freq: Once | INTRAVENOUS | Status: AC | PRN
  Administered 2013-08-17: 11 via INTRAVENOUS

## 2013-08-17 NOTE — Progress Notes (Signed)
[facility] SITE 3 NUCLEAR MED [address] [place], [place] [address] [phone]    Cardiology Nuclear Med Study  Gary Levy is a 72 y.o. male     MRN : [id number]     DOB: [birth year]  Procedure Date: 08/17/2013  Nuclear Med Background Indication for Stress Test:  Evaluation for Ischemia, Graft Patency and Stent Patency History:  CAD, MI, CABG, Stent to LAD, PTCA SVG to OM, Echo 2011 EF 35-40% MPI 2010 ([facility]), COPD Cardiac Risk Factors: Carotid Disease, Family History - CAD, History of Smoking, Hypertension and Lipids  Symptoms:  Chest Pain (last date of chest discomfort was three months ago) and DOE   Nuclear Pre-Procedure Caffeine/Decaff Intake:  None > 12 hrs NPO After: 9:00pm   Lungs:  clear O2 Sat: 95% on room air. IV 0.9% NS with Angio Cath:  20g  IV Site: R Antecubital x1 IV Started by:  Irean Hong, RN  Chest Size (in):  42 Cup Size: n/a  Height: 5\' 11"  (1.803 m)  Weight:  162 lb (73.483 kg)  BMI:  Body mass index is 22.6 kg/(m^2). Tech Comments:  Research officer, [employer] on arrival.    Nuclear Med Study 1 or 2 day study: 1 day  Stress Test Type:  Treadmill/Lexiscan  Reading MD: Hillis Range, MD  Order Authorizing Provider:  Verne Carrow, MD  Resting Radionuclide: Technetium 48m Sestamibi  Resting Radionuclide Dose: 11.0 mCi   Stress Radionuclide:  Technetium 51m Sestamibi  Stress Radionuclide Dose: 33.0 mCi           Stress Protocol Rest HR: 48 Stress HR: 112  Rest BP: 137/66 Stress BP: 142/85  Exercise Time (min): n/a METS: n/a   Predicted Max HR: 148 bpm % Max HR: 75.68 bpm Rate Pressure Product: 43329   Dose of Adenosine (mg):  n/a Dose of Lexiscan: 0.4 mg  Dose of Atropine (mg): n/a Dose of Dobutamine: n/a mcg/kg/min (at max HR)  Stress Test Technologist: Nelson Chimes, BS-ES  Nuclear Technologist:  Domenic Polite, CNMT     Rest Procedure:  Myocardial perfusion imaging was performed at rest 45 minutes following the intravenous  administration of Technetium 46m Sestamibi. Rest ECG: NSR - Normal EKG  Stress Procedure:  The patient received IV Lexiscan 0.4 mg over 15-seconds with concurrent low level exercise and then Technetium 32m Sestamibi was injected at 30-seconds while the patient continued walking one more minute.  Quantitative spect images were obtained after a 45-minute delay. During the infusion of Lexiscan, the patient complained of chest tightness.  Symptom resolved in recovery.  Stress ECG: No significant change from baseline ECG  QPS Raw Data Images:  Normal; no motion artifact; normal heart/lung ratio. Stress Images:  Large, severe inferolateral, basal to mid inferior, and mid to apical anterior perfusion defect. Rest Images:  Large, severe inferolateral, basal to mid inferior, and mid to apical anterior perfusion defect Subtraction (SDS):  Fixed large, severe inferolateral, basal to mid inferior, and mid to apical anterior perfusion defect Transient Ischemic Dilatation (Normal <1.22):  0.88 Lung/Heart Ratio (Normal <0.45):  0.25  Quantitative Gated Spect Images QGS EDV:  115 ml QGS ESV:  55 ml  Impression Exercise Capacity:  Lexiscan with low level exercise. BP Response:  Normal blood pressure response. Clinical Symptoms:  Chest tightness.  ECG Impression:  No significant ST segment change suggestive of ischemia. Comparison with Prior Nuclear Study: No significant change from previous study  Overall Impression:  Intermediate risk stress nuclear study with a fixed large,  severe inferolateral, basal to mid inferior, and mid to apical anterior perfusion defect.  This suggests prior infarction with no significant ischemia. Surprisingly, EF calculates to 53% (would consider echo to reassess EF which I would expect to be lower).   LV Ejection Fraction: 53%.  LV Wall Motion:  Peri-apical hypokinesis.   [name] 08/17/2013

## 2013-08-20 ENCOUNTER — Other Ambulatory Visit: Payer: Self-pay | Admitting: Cardiovascular Disease

## 2013-09-01 ENCOUNTER — Encounter: Payer: Self-pay | Admitting: Cardiovascular Disease

## 2013-09-01 ENCOUNTER — Ambulatory Visit (INDEPENDENT_AMBULATORY_CARE_PROVIDER_SITE_OTHER): Payer: Medicare HMO | Admitting: Cardiovascular Disease

## 2013-09-01 VITALS — BP 122/68 | HR 72 | Ht 71.0 in | Wt 163.0 lb

## 2013-09-01 DIAGNOSIS — I2510 Atherosclerotic heart disease of native coronary artery without angina pectoris: Secondary | ICD-10-CM

## 2013-09-01 DIAGNOSIS — I10 Essential (primary) hypertension: Secondary | ICD-10-CM

## 2013-09-01 DIAGNOSIS — I2589 Other forms of chronic ischemic heart disease: Secondary | ICD-10-CM

## 2013-09-01 DIAGNOSIS — E785 Hyperlipidemia, unspecified: Secondary | ICD-10-CM

## 2013-09-01 DIAGNOSIS — I255 Ischemic cardiomyopathy: Secondary | ICD-10-CM

## 2013-09-01 MED ORDER — NITROGLYCERIN 0.4 MG SL SUBL: 0.4000 mg | SUBLINGUAL_TABLET | SUBLINGUAL | Status: DC | PRN

## 2013-09-01 NOTE — Progress Notes (Signed)
History of Present Illness: 73 yo male with history of CAD s/p 4V CABG in 1995, ischemic cardiomyopathy, HTN, HLD, COPD, CKD here today for cardiac follow up. He has been followed in the past by Dr. Verl Blalock. He has CAD dating back to the 1990s with prior PCI and CABG. The last cath report I can find is scanned into EPIC from 1999 and shows failure of the LIMA graft to the LAD, the SVG to the RCA. There was high grade disease in the SVG to the OM and the SVG to the Diagonal. It appears that the LAD was treated with rotablator atherectomy and stenting in 1999. The SVG to the OM and the SVG to the OM was treated with angioplasty. Most recent stress test in 2010. Echo 2011 with LVEF=35-40%. I saw him 07/28/13 and he had c/o exertional chest pains, resolved with rest. Stress myoview 08/17/13 Intermediate risk study with a fixed large, severe inferolateral, basal to mid inferior, and mid to apical anterior perfusion defect. This suggests prior infarction with no significant ischemia. This is unchanged from 03/05/09.    He is here today for follow up. He has been feeling better. No chest pain over last two months. No dyspnea.   Primary Care Physician: Cathlean Cower  Last Lipid Profile:Lipid Panel     Component Value Date/Time   CHOL 114 10/08/2012 1146   TRIG 46.0 10/08/2012 1146   HDL 37.80* 10/08/2012 1146   CHOLHDL 3 10/08/2012 1146   VLDL 9.2 10/08/2012 1146   LDLCALC 67 10/08/2012 1146    Past Medical History  Diagnosis Date  . CAD (coronary artery disease) of artery bypass graft   . MI (myocardial infarction)     hx  . CHF (congestive heart failure)   . HLD (hyperlipidemia)   . HTN (hypertension)   . Fatigue   . COPD (chronic obstructive pulmonary disease)   . PSA elevation   . ED (erectile dysfunction) of organic origin   . Routine general medical examination at a health care facility   . Glucose intolerance (impaired glucose tolerance)   . Glaucoma   . Moderate or severe vision  impairment, both eyes, impairment level not further specified   . Postherpetic neuralgia   . Dysuria   . Shingles   . AR (allergic rhinitis)   . Special screening for malignant neoplasm of prostate   . Urinary frequency   . Skin cancer     hx; non-melanoma  . BPH (benign prostatic hypertrophy)   . Anemia, iron deficiency   . Alcohol abuse     hx  . Crohn's disease   . Impaired glucose tolerance 09/27/2011  . CKD (chronic kidney disease) 09/30/2011    Past Surgical History  Procedure Laterality Date  . Coronary artery bypass graft      11+ years ago; has an EF of about 45%, last Myoview showed no ischemia 12/30/06  . Coronary angioplasty with stent placement    . Right cataract  12/11    with lens implant   . Lih  2011    Dr. Rush Farmer     Current Outpatient Prescriptions  Medication Sig Dispense Refill  . aspirin 81 MG EC tablet Take 81 mg by mouth daily.        Marland Kitchen atorvastatin (LIPITOR) 80 MG tablet TAKE 1 TABLET BY MOUTH EVERY DAY  30 tablet  1  . carvedilol (COREG) 6.25 MG tablet Take 1 tablet (6.25 mg total) by mouth 2 (two) times  daily with a meal.  60 tablet  11  . fexofenadine (ALLEGRA) 180 MG tablet Take 1 tablet (180 mg total) by mouth daily.  30 tablet  11  . fluticasone (FLONASE) 50 MCG/ACT nasal spray Place 2 sprays into the nose daily.  16 g  5  . Multiple Vitamins-Minerals (CENTRUM PO) Take 1 tablet by mouth daily.        . nitroGLYCERIN (NITROSTAT) 0.4 MG SL tablet Place 0.4 mg under the tongue every 5 (five) minutes as needed.        . pregabalin (LYRICA) 50 MG capsule Take 50 mg by mouth at bedtime.        Marland Kitchen spironolactone (ALDACTONE) 25 MG tablet TAKE ONE TABLET BY MOUTH ONCE DAILY  30 tablet  0  . traMADol (ULTRAM) 50 MG tablet Take 50 mg by mouth every 6 (six) hours as needed.        . valsartan-hydrochlorothiazide (DIOVAN-HCT) 320-25 MG per tablet TAKE 1 TABLET EVERY DAY  30 tablet  10   No current facility-administered medications for this visit.    No  Known Allergies  History   Social History  . Marital Status: Married    Spouse Name: N/A    Number of Children: N/A  . Years of Education: N/A   Occupational History  . Not on file.   Social History Main Topics  . Smoking status: Former Smoker    Quit date: 03/20/1981  . Smokeless tobacco: Never Used  . Alcohol Use: No  . Drug Use: No  . Sexual Activity: Not on file   Other Topics Concern  . Not on file   Social History Narrative   Married, 4 children; retired - Museum/gallery curator.      Designated party release on file. [employer]. 01/15/10.     Family History  Problem Relation Age of Onset  . Heart disease Mother     Review of Systems:  As stated in the HPI and otherwise negative.   BP 122/68  Pulse 72  Ht 5\' 11"  (1.803 m)  Wt 163 lb (73.936 kg)  BMI 22.74 kg/m2  Physical Examination: General: Well developed, well nourished, NAD HEENT: OP clear, mucus membranes moist SKIN: warm, dry. No rashes. Neuro: No focal deficits Musculoskeletal: Muscle strength 5/5 all ext Psychiatric: Mood and affect normal Neck: No JVD, no carotid bruits, no thyromegaly, no lymphadenopathy. Lungs:Clear bilaterally, no wheezes, rhonci, crackles Cardiovascular: Regular rate and rhythm. No murmurs, gallops or rubs. Abdomen:Soft. Bowel sounds present. Non-tender.  Extremities: No lower extremity edema. Pulses are 2 + in the bilateral DP/PT.  EKG: NSR, rate 71 bpm. Non-specific ST abnormality.   Echo 03/26/10: Left ventricle: The EF is in the 35-40% range. There is mild global hypokinesis with more marked hypokinesis of the inferior and posterior walls. - Right ventricle: The cavity size was mildly dilated. Systolic function was mildly reduced. - Right atrium: The atrium was mildly dilated.  Stress myoview 08/17/13: Stress Procedure: The patient received IV Lexiscan 0.4 mg over 15-seconds with concurrent low level exercise and then Technetium 75m Sestamibi was injected at  30-seconds while the patient continued walking one more minute. Quantitative spect images were obtained after a 45-minute delay. During the infusion of Lexiscan, the patient complained of chest tightness. Symptom resolved in recovery.  Stress ECG: No significant change from baseline ECG  QPS  Raw Data Images: Normal; no motion artifact; normal heart/lung ratio.  Stress Images: Large, severe inferolateral, basal to mid inferior, and mid to  apical anterior perfusion defect.  Rest Images: Large, severe inferolateral, basal to mid inferior, and mid to apical anterior perfusion defect  Subtraction (SDS): Fixed large, severe inferolateral, basal to mid inferior, and mid to apical anterior perfusion defect  Transient Ischemic Dilatation (Normal <1.22): 0.88  Lung/Heart Ratio (Normal <0.45): 0.25  Quantitative Gated Spect Images  QGS EDV: 115 ml  QGS ESV: 55 ml  Impression  Exercise Capacity: Lexiscan with low level exercise.  BP Response: Normal blood pressure response.  Clinical Symptoms: Chest tightness.  ECG Impression: No significant ST segment change suggestive of ischemia.  Comparison with Prior Nuclear Study: No significant change from previous study  Overall Impression: Intermediate risk stress nuclear study with a fixed large, severe inferolateral, basal to mid inferior, and mid to apical anterior perfusion defect. This suggests prior infarction with no significant ischemia. Surprisingly, EF calculates to 53% (would consider echo to reassess EF which I would expect to be lower).  LV Ejection Fraction: 53%. LV Wall Motion: Peri-apical hypokinesis.   Assessment and Plan:   1. CAD: s/p CABG. Stress myoview 08/17/13 with large scar unchanged from 2010, no ischemia.   2. HTN: BP controlled. No changes.   3. Hyperlipidemia: He is on a statin. Lipids well controlled. No changes.   4. Ischemic cardiomyopathy: Last LVEF=35% by echo December 2014. Continue medical therapy.   5. Chest pain: No  recurrence. If he has more chest pain, would try Imdur and consider repeat cath.  6. Chronic systolic CHF: Volume status is stable today. No changes.

## 2013-09-01 NOTE — Patient Instructions (Signed)
Your physician wants you to follow-up in:  6 months. You will receive a reminder letter in the mail two months in advance. If you don't receive a letter, please call our office to schedule the follow-up appointment.   

## 2013-10-11 ENCOUNTER — Ambulatory Visit: Payer: Medicare HMO | Admitting: Internal Medicine

## 2013-10-12 ENCOUNTER — Encounter: Payer: Self-pay | Admitting: Internal Medicine

## 2013-10-12 ENCOUNTER — Other Ambulatory Visit (INDEPENDENT_AMBULATORY_CARE_PROVIDER_SITE_OTHER): Payer: Commercial Managed Care - HMO

## 2013-10-12 ENCOUNTER — Other Ambulatory Visit: Payer: Self-pay | Admitting: Internal Medicine

## 2013-10-12 ENCOUNTER — Ambulatory Visit (INDEPENDENT_AMBULATORY_CARE_PROVIDER_SITE_OTHER): Payer: Medicare HMO | Admitting: Internal Medicine

## 2013-10-12 VITALS — BP 122/70 | HR 66 | Temp 97.0°F | Ht 71.0 in | Wt 166.5 lb

## 2013-10-12 DIAGNOSIS — Z0000 Encounter for general adult medical examination without abnormal findings: Secondary | ICD-10-CM | POA: Diagnosis not present

## 2013-10-12 DIAGNOSIS — D649 Anemia, unspecified: Secondary | ICD-10-CM | POA: Diagnosis not present

## 2013-10-12 DIAGNOSIS — R7309 Other abnormal glucose: Secondary | ICD-10-CM

## 2013-10-12 DIAGNOSIS — R7302 Impaired glucose tolerance (oral): Secondary | ICD-10-CM

## 2013-10-12 DIAGNOSIS — D509 Iron deficiency anemia, unspecified: Secondary | ICD-10-CM

## 2013-10-12 DIAGNOSIS — Z23 Encounter for immunization: Secondary | ICD-10-CM

## 2013-10-12 LAB — URINALYSIS, ROUTINE W REFLEX MICROSCOPIC
Bilirubin Urine: NEGATIVE
Hgb urine dipstick: NEGATIVE
KETONES UR: NEGATIVE
Leukocytes, UA: NEGATIVE
NITRITE: NEGATIVE
Specific Gravity, Urine: 1.015 (ref 1.000–1.030)
Total Protein, Urine: NEGATIVE
Urine Glucose: NEGATIVE
Urobilinogen, UA: 0.2 (ref 0.0–1.0)
WBC UA: NONE SEEN — AB (ref 0–?)
pH: 6.5 (ref 5.0–8.0)

## 2013-10-12 LAB — BASIC METABOLIC PANEL
BUN: 28 mg/dL — AB (ref 6–23)
CO2: 27 mEq/L (ref 19–32)
CREATININE: 1.8 mg/dL — AB (ref 0.4–1.5)
Calcium: 9.9 mg/dL (ref 8.4–10.5)
Chloride: 102 mEq/L (ref 96–112)
GFR: 49.06 mL/min — AB (ref >60.00)
Glucose, Bld: 103 mg/dL — ABNORMAL HIGH (ref 70–99)
Potassium: 5.2 mEq/L — ABNORMAL HIGH (ref 3.5–5.1)
Sodium: 136 mEq/L (ref 135–145)

## 2013-10-12 LAB — CBC WITH DIFFERENTIAL/PLATELET
BASOS ABS: 0 10*3/uL (ref 0.0–0.1)
Basophils Relative: 0.6 % (ref 0.0–3.0)
EOS ABS: 0.3 10*3/uL (ref 0.0–0.7)
Eosinophils Relative: 5.9 % — ABNORMAL HIGH (ref 0.0–5.0)
HEMATOCRIT: 35.5 % — AB (ref 39.0–52.0)
Hemoglobin: 11.5 g/dL — ABNORMAL LOW (ref 13.0–17.0)
LYMPHS ABS: 1.1 10*3/uL (ref 0.7–4.0)
Lymphocytes Relative: 21.2 % (ref 12.0–46.0)
MCHC: 32.4 g/dL (ref 30.0–36.0)
MCV: 86.8 fl (ref 78.0–100.0)
MONO ABS: 0.7 10*3/uL (ref 0.1–1.0)
Monocytes Relative: 14.3 % — ABNORMAL HIGH (ref 3.0–12.0)
Neutro Abs: 2.9 10*3/uL (ref 1.4–7.7)
Neutrophils Relative %: 58 % (ref 43.0–77.0)
PLATELETS: 218 10*3/uL (ref 150.0–400.0)
RBC: 4.09 Mil/uL — ABNORMAL LOW (ref 4.22–5.81)
RDW: 15.2 % — AB (ref 11.5–14.6)
WBC: 5.1 10*3/uL (ref 4.5–10.5)

## 2013-10-12 LAB — TSH: TSH: 4.59 u[IU]/mL (ref 0.35–5.50)

## 2013-10-12 LAB — HEPATIC FUNCTION PANEL
ALT: 26 U/L (ref 0–53)
AST: 33 U/L (ref 0–37)
Albumin: 3.8 g/dL (ref 3.5–5.2)
Alkaline Phosphatase: 66 U/L (ref 39–117)
Bilirubin, Direct: 0.1 mg/dL (ref 0.0–0.3)
Total Bilirubin: 1 mg/dL (ref 0.3–1.2)
Total Protein: 8.1 g/dL (ref 6.0–8.3)

## 2013-10-12 LAB — LIPID PANEL
CHOLESTEROL: 128 mg/dL (ref 0–200)
HDL: 46.4 mg/dL (ref >39.00)
LDL CALC: 74 mg/dL (ref 0–99)
TRIGLYCERIDES: 38 mg/dL (ref 0.0–149.0)
Total CHOL/HDL Ratio: 3
VLDL: 7.6 mg/dL (ref 0.0–40.0)

## 2013-10-12 LAB — PSA: PSA: 4.74 ng/mL — AB (ref 0.10–4.00)

## 2013-10-12 LAB — IBC PANEL
IRON: 40 ug/dL — AB (ref 42–165)
Saturation Ratios: 11.1 % — ABNORMAL LOW (ref 20.0–50.0)
Transferrin: 256.6 mg/dL (ref 212.0–360.0)

## 2013-10-12 LAB — HEMOGLOBIN A1C: HEMOGLOBIN A1C: 5.6 % (ref 4.6–6.5)

## 2013-10-12 NOTE — Patient Instructions (Addendum)
You had the new Prevnar pneumonia shot today  Please continue all other medications as before, and refills have been done if requested.  Please have the pharmacy call with any other refills you may need.  Please continue your efforts at being more active, low cholesterol diet, and weight control.  You are otherwise up to date with prevention measures today.  Please keep your appointments with your specialists as you may have planned  Please go to the LAB in the Basement (turn left off the elevator) for the tests to be done today  You will be contacted by phone if any changes need to be made immediately.  Otherwise, you will receive a letter about your results with an explanation, but please check with MyChart first.  Please remember to sign up for MyChart if you have not done so, as this will be important to you in the future with finding out test results, communicating by private email, and scheduling acute appointments online when needed.  Please return in 6 months, or sooner if needed 

## 2013-10-12 NOTE — Assessment & Plan Note (Signed)

## 2013-10-12 NOTE — Progress Notes (Signed)
Pre visit review using our clinic review tool, if applicable. No additional management support is needed unless otherwise documented below in the visit note. 

## 2013-10-12 NOTE — Assessment & Plan Note (Signed)
Asympt, no overt bleeding,For iron panel today,  to f/u any worsening symptoms or concerns Lab Results  Component Value Date   WBC 4.2* 10/08/2012   HGB 11.2* 10/08/2012   HCT 34.1* 10/08/2012   MCV 85.2 10/08/2012   PLT 209.0 10/08/2012

## 2013-10-12 NOTE — Addendum Note (Signed)
Addended by: Sharon Seller B on: 10/12/2013 12:05 PM   Modules accepted: Orders

## 2013-10-12 NOTE — Progress Notes (Signed)
Subjective:    Patient ID: Gary Levy, male    DOB: August 21, 1940, 73 y.o.   MRN: [id number]  HPI  Here for wellness and f/u;  Overall doing ok;  Pt denies CP, worsening SOB, DOE, wheezing, orthopnea, PND, worsening LE edema, palpitations, dizziness or syncope.  Pt denies neurological change such as new headache, facial or extremity weakness.  Pt denies polydipsia, polyuria, or low sugar symptoms. Pt states overall good compliance with treatment and medications, good tolerability, and has been trying to follow lower cholesterol diet.  Pt denies worsening depressive symptoms, suicidal ideation or panic. No fever, night sweats, wt loss, loss of appetite, or other constitutional symptoms.  Pt states good ability with ADL's, has low fall risk, home safety reviewed and adequate, no other significant changes in hearing or vision, and only occasionally active with exercise.  No acute complaints Past Medical History  Diagnosis Date  . CAD (coronary artery disease) of artery bypass graft   . MI (myocardial infarction)     hx  . CHF (congestive heart failure)   . HLD (hyperlipidemia)   . HTN (hypertension)   . Fatigue   . COPD (chronic obstructive pulmonary disease)   . PSA elevation   . ED (erectile dysfunction) of organic origin   . Routine general medical examination at a health care facility   . Glucose intolerance (impaired glucose tolerance)   . Glaucoma   . Moderate or severe vision impairment, both eyes, impairment level not further specified   . Postherpetic neuralgia   . Dysuria   . Shingles   . AR (allergic rhinitis)   . Special screening for malignant neoplasm of prostate   . Urinary frequency   . Skin cancer     hx; non-melanoma  . BPH (benign prostatic hypertrophy)   . Anemia, iron deficiency   . Alcohol abuse     hx  . Crohn's disease   . Impaired glucose tolerance 09/27/2011  . CKD (chronic kidney disease) 09/30/2011   Past Surgical History  Procedure Laterality Date  .  Coronary artery bypass graft      11+ years ago; has an EF of about 45%, last Myoview showed no ischemia 12/30/06  . Coronary angioplasty with stent placement    . Right cataract  12/11    with lens implant   . Lih  2011    Dr. Rush Farmer     reports that he quit smoking about 32 years ago. He has never used smokeless tobacco. He reports that he does not drink alcohol or use illicit drugs. family history includes Heart disease in his mother. No Known Allergies Current Outpatient Prescriptions on File Prior to Visit  Medication Sig Dispense Refill  . aspirin 81 MG EC tablet Take 81 mg by mouth daily.        Marland Kitchen atorvastatin (LIPITOR) 80 MG tablet TAKE 1 TABLET BY MOUTH EVERY DAY  30 tablet  1  . carvedilol (COREG) 6.25 MG tablet Take 1 tablet (6.25 mg total) by mouth 2 (two) times daily with a meal.  60 tablet  11  . fexofenadine (ALLEGRA) 180 MG tablet Take 1 tablet (180 mg total) by mouth daily.  30 tablet  11  . fluticasone (FLONASE) 50 MCG/ACT nasal spray Place 2 sprays into the nose daily.  16 g  5  . Multiple Vitamins-Minerals (CENTRUM PO) Take 1 tablet by mouth daily.        . nitroGLYCERIN (NITROSTAT) 0.4 MG SL tablet Place 1 tablet (  0.4 mg total) under the tongue every 5 (five) minutes as needed.  25 tablet  6  . pregabalin (LYRICA) 50 MG capsule Take 50 mg by mouth at bedtime.        Marland Kitchen spironolactone (ALDACTONE) 25 MG tablet TAKE ONE TABLET BY MOUTH ONCE DAILY  30 tablet  0  . traMADol (ULTRAM) 50 MG tablet Take 50 mg by mouth every 6 (six) hours as needed.        . valsartan-hydrochlorothiazide (DIOVAN-HCT) 320-25 MG per tablet TAKE 1 TABLET EVERY DAY  30 tablet  10   No current facility-administered medications on file prior to visit.   Review of Systems Constitutional: Negative for diaphoresis, activity change, appetite change or unexpected weight change.  HENT: Negative for hearing loss, ear pain, facial swelling, mouth sores and neck stiffness.   Eyes: Negative for pain,  redness and visual disturbance.  Respiratory: Negative for shortness of breath and wheezing.   Cardiovascular: Negative for chest pain and palpitations.  Gastrointestinal: Negative for diarrhea, blood in stool, abdominal distention or other pain Genitourinary: Negative for hematuria, flank pain or change in urine volume.  Musculoskeletal: Negative for myalgias and joint swelling.  Skin: Negative for color change and wound.  Neurological: Negative for syncope and numbness. other than noted Hematological: Negative for adenopathy.  Psychiatric/Behavioral: Negative for hallucinations, self-injury, decreased concentration and agitation.      Objective:   Physical Exam BP 122/70  Pulse 66  Temp(Src) 97 F (36.1 C) (Oral)  Ht 5\' 11"  (1.803 m)  Wt 166 lb 8 oz (75.524 kg)  BMI 23.23 kg/m2  SpO2 91% VS noted,  Constitutional: Pt is oriented to person, place, and time. Appears well-developed and well-nourished.  Head: Normocephalic and atraumatic.  Right Ear: External ear normal.  Left Ear: External ear normal.  Nose: Nose normal.  Mouth/Throat: Oropharynx is clear and moist.  Eyes: Conjunctivae and EOM are normal. Pupils are equal, round, and reactive to light.  Neck: Normal range of motion. Neck supple. No JVD present. No tracheal deviation present.  Cardiovascular: Normal rate, regular rhythm, normal heart sounds and intact distal pulses.   Pulmonary/Chest: Effort normal and breath sounds normal.  Abdominal: Soft. Bowel sounds are normal. There is no tenderness. No HSM  Musculoskeletal: Normal range of motion. Exhibits no edema.  Lymphadenopathy:  Has no cervical adenopathy.  Neurological: Pt is alert and oriented to person, place, and time. Pt has normal reflexes. No cranial nerve deficit.  Skin: Skin is warm and dry. No rash noted.  Psychiatric:  Has  normal mood and affect. Behavior is normal.     Assessment & Plan:

## 2013-10-17 ENCOUNTER — Other Ambulatory Visit: Payer: Self-pay | Admitting: Cardiovascular Disease

## 2013-10-17 ENCOUNTER — Telehealth: Payer: Self-pay | Admitting: Internal Medicine

## 2013-10-17 NOTE — Telephone Encounter (Signed)
Patient's wife called concerning the patient's feet which she says are swollen and causing him pain when trying to walk. She wants to know what Dr. Jenny Reichmann might recommend or if another OV is necessary. Patient was in for [facility] [facility] on 10/12/13. Please advise.

## 2013-10-18 NOTE — Telephone Encounter (Signed)
Wife informed  And did schedule appointment.

## 2013-10-18 NOTE — Telephone Encounter (Signed)
Needs ROV please 

## 2013-10-19 ENCOUNTER — Ambulatory Visit (INDEPENDENT_AMBULATORY_CARE_PROVIDER_SITE_OTHER): Payer: Commercial Managed Care - HMO | Admitting: Internal Medicine

## 2013-10-19 ENCOUNTER — Encounter: Payer: Self-pay | Admitting: Internal Medicine

## 2013-10-19 VITALS — BP 100/62 | HR 75 | Temp 97.7°F | Wt 167.8 lb

## 2013-10-19 DIAGNOSIS — M109 Gout, unspecified: Secondary | ICD-10-CM | POA: Insufficient documentation

## 2013-10-19 DIAGNOSIS — D509 Iron deficiency anemia, unspecified: Secondary | ICD-10-CM | POA: Insufficient documentation

## 2013-10-19 DIAGNOSIS — J209 Acute bronchitis, unspecified: Secondary | ICD-10-CM

## 2013-10-19 DIAGNOSIS — R7302 Impaired glucose tolerance (oral): Secondary | ICD-10-CM

## 2013-10-19 DIAGNOSIS — I10 Essential (primary) hypertension: Secondary | ICD-10-CM

## 2013-10-19 DIAGNOSIS — R7309 Other abnormal glucose: Secondary | ICD-10-CM

## 2013-10-19 MED ORDER — PREDNISONE 10 MG PO TABS: ORAL_TABLET | ORAL | Status: DC

## 2013-10-19 MED ORDER — HYDROCODONE-ACETAMINOPHEN 10-325 MG PO TABS: 1.0000 | ORAL_TABLET | Freq: Four times a day (QID) | ORAL | Status: DC | PRN

## 2013-10-19 MED ORDER — METHYLPREDNISOLONE ACETATE 80 MG/ML IJ SUSP
120.0000 mg | Freq: Once | INTRAMUSCULAR | Status: AC
  Administered 2013-10-19: 120 mg via INTRAMUSCULAR

## 2013-10-19 MED ORDER — BENZONATATE 100 MG PO CAPS: 100.0000 mg | ORAL_CAPSULE | Freq: Two times a day (BID) | ORAL | Status: DC | PRN

## 2013-10-19 MED ORDER — AZITHROMYCIN 250 MG PO TABS: ORAL_TABLET | ORAL | Status: DC

## 2013-10-19 NOTE — Progress Notes (Signed)
Subjective:    Patient ID: Gary Levy, male    DOB: November 15, 1940, 73 y.o.   MRN: [id number]  HPI Here to f/u, unfort with 2-3 days onset now severe right > left bilat feet/first MTP red/pain/swelling without fever, denies signficant ETOH, takes 2 low dose diuretic, no prior hx of gout.  Also - Here with acute onset mild to mod 2-3 days ST, HA, general weakness and malaise, with prod cough greenish sputum, but Pt denies chest pain, increased sob or doe, wheezing, orthopnea, PND, increased LE swelling, palpitations, dizziness or syncope.  Recent iron deficiency noted on lab Past Medical History  Diagnosis Date  . CAD (coronary artery disease) of artery bypass graft   . MI (myocardial infarction)     hx  . CHF (congestive heart failure)   . HLD (hyperlipidemia)   . HTN (hypertension)   . Fatigue   . COPD (chronic obstructive pulmonary disease)   . PSA elevation   . ED (erectile dysfunction) of organic origin   . Routine general medical examination at a health care facility   . Glucose intolerance (impaired glucose tolerance)   . Glaucoma   . Moderate or severe vision impairment, both eyes, impairment level not further specified   . Postherpetic neuralgia   . Dysuria   . Shingles   . AR (allergic rhinitis)   . Special screening for malignant neoplasm of prostate   . Urinary frequency   . Skin cancer     hx; non-melanoma  . BPH (benign prostatic hypertrophy)   . Anemia, iron deficiency   . Alcohol abuse     hx  . Crohn's disease   . Impaired glucose tolerance 09/27/2011  . CKD (chronic kidney disease) 09/30/2011   Past Surgical History  Procedure Laterality Date  . Coronary artery bypass graft      11+ years ago; has an EF of about 45%, last Myoview showed no ischemia 12/30/06  . Coronary angioplasty with stent placement    . Right cataract  12/11    with lens implant   . Lih  2011    Dr. Rush Farmer     reports that he quit smoking about 32 years ago. He has never used  smokeless tobacco. He reports that he does not drink alcohol or use illicit drugs. family history includes Heart disease in his mother. No Known Allergies Current Outpatient Prescriptions on File Prior to Visit  Medication Sig Dispense Refill  . aspirin 81 MG EC tablet Take 81 mg by mouth daily.        Marland Kitchen atorvastatin (LIPITOR) 80 MG tablet TAKE 1 TABLET BY MOUTH EVERY DAY  30 tablet  1  . carvedilol (COREG) 6.25 MG tablet Take 1 tablet (6.25 mg total) by mouth 2 (two) times daily with a meal.  60 tablet  11  . fexofenadine (ALLEGRA) 180 MG tablet Take 1 tablet (180 mg total) by mouth daily.  30 tablet  11  . fluticasone (FLONASE) 50 MCG/ACT nasal spray Place 2 sprays into the nose daily.  16 g  5  . Multiple Vitamins-Minerals (CENTRUM PO) Take 1 tablet by mouth daily.        . nitroGLYCERIN (NITROSTAT) 0.4 MG SL tablet Place 1 tablet (0.4 mg total) under the tongue every 5 (five) minutes as needed.  25 tablet  6  . pregabalin (LYRICA) 50 MG capsule Take 50 mg by mouth at bedtime.        Marland Kitchen spironolactone (ALDACTONE) 25 MG tablet TAKE  ONE TABLET BY MOUTH ONCE DAILY  30 tablet  0  . traMADol (ULTRAM) 50 MG tablet Take 50 mg by mouth every 6 (six) hours as needed.        . valsartan-hydrochlorothiazide (DIOVAN-HCT) 320-25 MG per tablet TAKE 1 TABLET EVERY DAY  30 tablet  10   No current facility-administered medications on file prior to visit.    Review of Systems  Constitutional: Negative for unexpected weight change, or unusual diaphoresis  HENT: Negative for tinnitus.   Eyes: Negative for photophobia and visual disturbance.  Respiratory: Negative for choking and stridor.   Gastrointestinal: Negative for vomiting and blood in stool.  Genitourinary: Negative for hematuria and decreased urine volume.  Musculoskeletal: Negative for acute joint swelling Skin: Negative for color change and wound.  Neurological: Negative for tremors and numbness other than noted  Psychiatric/Behavioral:  Negative for decreased concentration or  hyperactivity.       Objective:   Physical Exam BP 100/62  Pulse 75  Temp(Src) 97.7 F (36.5 C) (Oral)  Wt 167 lb 12 oz (76.091 kg)  SpO2 95% VS noted, not ill appearing but severe pain to feet to walk bilat Constitutional: Pt appears well-developed and well-nourished.  HENT: Head: NCAT.  Right Ear: External ear normal.  Left Ear: External ear normal.  Eyes: Conjunctivae and EOM are normal. Pupils are equal, round, and reactive to light.  Neck: Normal range of motion. Neck supple.  Cardiovascular: Normal rate and regular rhythm.   Pulmonary/Chest: Effort normal and breath sounds normal.  Neurological: Pt is alert. Not confused  Bilat first MTP 2-3+ red/tender/swelling Psychiatric: Pt behavior is normal. Thought content normal.     Assessment & Plan:

## 2013-10-19 NOTE — Assessment & Plan Note (Signed)
stable overall by history and exam, recent data reviewed with pt, and pt to continue medical treatment as before,  to f/u any worsening symptoms or concerns BP Readings from Last 3 Encounters:  10/19/13 100/62  10/12/13 122/70  09/01/13 122/68

## 2013-10-19 NOTE — Assessment & Plan Note (Signed)
Rather severe and dramatic bilat feet , on 2 low dose diuretic, no significant ETOH use, for depomedrol IM, predpack asd, and limited hydrocodone prn, keep feet elevated for next 2 days when sitting, cont all other meds, add uric acid with next labs, consider add allopurinol for recurring pain/swelling

## 2013-10-19 NOTE — Assessment & Plan Note (Signed)
Has been referred to Gi,  to f/u any worsening symptoms or concerns

## 2013-10-19 NOTE — Assessment & Plan Note (Addendum)
stable overall by history and exam, recent data reviewed with pt, and pt to continue medical treatment as before,  to f/u any worsening symptoms or concerns Lab Results  Component Value Date   HGBA1C 5.6 10/12/2013   To call for onset polys or cbg > 200

## 2013-10-19 NOTE — Assessment & Plan Note (Addendum)
Mild to mod, for antibx course,  to f/u any worsening symptoms or concerns  Note:  Total time for pt hx, exam, review of record with pt in the room, determination of diagnoses and plan for further eval and tx is > 40 min, with over 50% spent in coordination and counseling of patient 

## 2013-10-19 NOTE — Patient Instructions (Addendum)
You had the steroid shot today  Please take all new medication as prescribed - the prednisone as prescribed, and the pain medication as needed for a few days, as well as the antibiotic, and pills for cough  Please continue all other medications as before, and refills have been done if requested. Please have the pharmacy call with any other refills you may need.  Please remember to sign up for MyChart if you have not done so, as this will be important to you in the future with finding out test results, communicating by private email, and scheduling acute appointments online when needed.  Please call if you do not hear from the office soon about the GI referral

## 2013-10-19 NOTE — Progress Notes (Signed)
Pre visit review using our clinic review tool, if applicable. No additional management support is needed unless otherwise documented below in the visit note. 

## 2013-10-24 ENCOUNTER — Other Ambulatory Visit: Payer: Self-pay | Admitting: Cardiovascular Disease

## 2013-10-24 ENCOUNTER — Other Ambulatory Visit: Payer: Self-pay | Admitting: Internal Medicine

## 2013-10-26 ENCOUNTER — Encounter: Payer: Self-pay | Admitting: Internal Medicine

## 2013-11-10 ENCOUNTER — Other Ambulatory Visit: Payer: Self-pay | Admitting: Cardiovascular Disease

## 2013-11-27 ENCOUNTER — Other Ambulatory Visit: Payer: Self-pay | Admitting: Internal Medicine

## 2013-11-28 ENCOUNTER — Other Ambulatory Visit: Payer: Self-pay

## 2013-11-28 MED ORDER — CARVEDILOL 6.25 MG PO TABS: 6.2500 mg | ORAL_TABLET | Freq: Two times a day (BID) | ORAL | Status: DC

## 2013-12-27 ENCOUNTER — Ambulatory Visit (INDEPENDENT_AMBULATORY_CARE_PROVIDER_SITE_OTHER): Payer: Commercial Managed Care - HMO | Admitting: Internal Medicine

## 2013-12-27 ENCOUNTER — Encounter: Payer: Self-pay | Admitting: Internal Medicine

## 2013-12-27 VITALS — BP 110/66 | HR 84 | Ht 71.0 in | Wt 167.0 lb

## 2013-12-27 DIAGNOSIS — D509 Iron deficiency anemia, unspecified: Secondary | ICD-10-CM

## 2013-12-27 MED ORDER — MOVIPREP 100 G PO SOLR: 1.0000 | Freq: Once | ORAL | Status: DC

## 2013-12-27 NOTE — Progress Notes (Signed)
Gary Levy 01/21/1941 [id number]  Note: This dictation was prepared with Dragon digital system. Any transcriptional errors that result from this procedure are unintentional.   History of Present Illness:  This is a 73 year old African American male with iron deficiency anemia. In 2013, his iron saturation was 12.7%. He has chronic renal insufficiency with GFR of 49, creatinine of 1.8, BUN of 28. In February 2015, his iron saturation was 11.1%. His hemoglobin was 11.5 and hematocrit was 35.5 with MCV of 86. He denies any visible blood per rectum. He denies dysphagia, heartburn or abdominal pain. He has lost about 8 pounds. He had a colonoscopy in 1998 and again in February 2008 with findings of internal hemorrhoids. There is no family history of colon cancer. His bowel habits are regular. He has a history of coronary artery disease. He is post coronary artery bypass graft. He has high blood pressure and hyperlipidemia. He is a smoker.    Past Medical History  Diagnosis Date  . CAD (coronary artery disease) of artery bypass graft   . MI (myocardial infarction)     hx  . CHF (congestive heart failure)   . HLD (hyperlipidemia)   . HTN (hypertension)   . Fatigue   . COPD (chronic obstructive pulmonary disease)   . PSA elevation   . ED (erectile dysfunction) of organic origin   . Routine general medical examination at a health care facility   . Glucose intolerance (impaired glucose tolerance)   . Glaucoma   . Moderate or severe vision impairment, both eyes, impairment level not further specified   . Postherpetic neuralgia   . Dysuria   . Shingles   . AR (allergic rhinitis)   . Special screening for malignant neoplasm of prostate   . Urinary frequency   . Skin cancer     hx; non-melanoma  . BPH (benign prostatic hypertrophy)   . Anemia, iron deficiency   . Alcohol abuse     hx  . Crohn's disease   . Impaired glucose tolerance 09/27/2011  . CKD (chronic kidney disease) 09/30/2011     Past Surgical History  Procedure Laterality Date  . Coronary artery bypass graft      11+ years ago; has an EF of about 45%, last Myoview showed no ischemia 12/30/06  . Coronary angioplasty with stent placement    . Right cataract  12/11    with lens implant   . Lih  2011    Dr. Rush Farmer     No Known Allergies  Family history and social history have been reviewed.  Review of Systems: Negative for dysphagia, abdominal pain, positive for weight loss  The remainder of the 10 point ROS is negative except as outlined in the H&P  Physical Exam: General Appearance Well developed, in no distress Eyes  Non icteric  HEENT  Non traumatic, normocephalic  Mouth No lesion, tongue papillated, no cheilosis Neck Supple without adenopathy, thyroid not enlarged, no carotid bruits, no JVD Lungs Clear to auscultation bilaterally, postthoracotomy scar COR Normal S1, normal S2, regular rhythm, no murmur, quiet precordium Abdomen soft nontender with normoactive bowel sounds, post left inguinal hernia repair Rectal small amount of soft Hemoccult negative stool Extremities  No pedal edema Skin No lesions Neurological Alert and oriented x 3 Psychological Normal mood and affect  Assessment and Plan:   Problem #45 73 year old, patient of Dr. Jenny Reichmann with asymptomatic iron deficiency anemia and heme negative stool on my exam today. The iron deficiency may be multifactorial. One of  the contributing factors is chronic renal insufficiency. We need to rule out low-grade GI blood loss secondary to low dose aspirin. We also need to rule out an occult GI lesion in the colon or upper GI tract or AVMs. He will start taking an over the counter iron replacement and we will go ahead and schedule an upper endoscopy and colonoscopy. I discussed both procedures with the patient as well as the prep and the sedation. He agrees with the plan.    Lafayette Dragon 12/27/2013

## 2013-12-27 NOTE — Patient Instructions (Addendum)
You have been scheduled for an endoscopy and colonoscopy with propofol. Please follow the written instructions given to you at your visit today. Please pick up your prep at the pharmacy within the next 1-3 days. If you use inhalers (even only as needed), please bring them with you on the day of your procedure. Your physician has requested that you go to www.startemmi.com and enter the access code given to you at your visit today. This web site gives a general overview about your procedure. However, you should still follow specific instructions given to you by our office regarding your preparation for the procedure.  Please purchase the following medications over the counter and take as directed: Iron 325 mg once daily  CC:Dr Cathlean Cower

## 2014-01-02 ENCOUNTER — Telehealth: Payer: Self-pay | Admitting: Internal Medicine

## 2014-01-02 NOTE — Telephone Encounter (Signed)
Patient's wife has been advised that I have put a coupon at the front desk for patient to get a free moviprep. She verbalizes understanding.

## 2014-01-11 ENCOUNTER — Encounter: Payer: Self-pay | Admitting: Internal Medicine

## 2014-02-14 ENCOUNTER — Ambulatory Visit (AMBULATORY_SURGERY_CENTER): Payer: Medicare HMO | Admitting: Internal Medicine

## 2014-02-14 ENCOUNTER — Encounter: Payer: Self-pay | Admitting: Internal Medicine

## 2014-02-14 VITALS — BP 102/72 | HR 54 | Temp 96.3°F | Resp 17 | Ht 70.0 in | Wt 167.0 lb

## 2014-02-14 DIAGNOSIS — K2960 Other gastritis without bleeding: Secondary | ICD-10-CM

## 2014-02-14 DIAGNOSIS — A048 Other specified bacterial intestinal infections: Secondary | ICD-10-CM

## 2014-02-14 DIAGNOSIS — K2970 Gastritis, unspecified, without bleeding: Secondary | ICD-10-CM

## 2014-02-14 DIAGNOSIS — D509 Iron deficiency anemia, unspecified: Secondary | ICD-10-CM

## 2014-02-14 DIAGNOSIS — K2990 Gastroduodenitis, unspecified, without bleeding: Secondary | ICD-10-CM

## 2014-02-14 MED ORDER — SODIUM CHLORIDE 0.9 % IV SOLN: 500.0000 mL | INTRAVENOUS | Status: DC

## 2014-02-14 MED ORDER — OMEPRAZOLE 20 MG PO CPDR: 20.0000 mg | DELAYED_RELEASE_CAPSULE | Freq: Every day | ORAL | Status: DC

## 2014-02-14 NOTE — Op Note (Signed)
[facility]  [address]. [place], [id number]   ENDOSCOPY PROCEDURE REPORT  PATIENT: Gary, Levy  MR#: [id number] BIRTHDATE: Jun 14, 1941 , 73  yrs. old GENDER: Male ENDOSCOPIST: Lafayette Dragon, MD REFERRED BY:  Cathlean Cower, M.D. PROCEDURE DATE:  02/14/2014 PROCEDURE:  EGD w/ biopsy ASA CLASS:     Class III INDICATIONS:  Iron deficiency anemia.   patient on aspirin.  11% iron saturation.  Weight loss.  Chronic renal insufficiency.  Takes aspirin.  He is Hemoccult negative. MEDICATIONS: MAC sedation, administered by CRNA and propofol (Diprivan) 150mg  IV TOPICAL ANESTHETIC: none  DESCRIPTION OF PROCEDURE: After the risks benefits and alternatives of the procedure were thoroughly explained, informed consent was obtained.  The LB [id number] endoscope was introduced through the mouth and advanced to the second portion of the duodenum. Without limitations.  The instrument was slowly withdrawn as the mucosa was fully examined.      Esophagus: proximal mid and distal esophageal mucosa was normal. There were a few short erosions at the GE junction consistent with grade A  reflux esophagitis. There was no stricture or hiatal hernia Stomach: there was  coffee ground material in the stomach and there were multiple linear erosions covered with fresh blood and old blood. These findings were consistent with hemorrhagic gastritis. There were no gastric varices. There was no ulcer. The pyloric outlet was normal. Biopsies were taken from gastric erosions to rule out H. pylori. Retroversion of the endoscope revealed normal fundus and cardia Duodenum: duodenal bulb and descending duodenum was normal[ The scope was then withdrawn from the patient and the procedure completed.  COMPLICATIONS: There were no complications. ENDOSCOPIC IMPRESSION: hemorrhagic gastritis area most likely explanation for patient's iron deficiency anemia Status post gastric biopsies to rule  out H. pylori Grade a reflux esophagitis  RECOMMENDATIONS: 1.  Await pathology results 2.  Continue PPI Proceed with colonoscopy  REPEAT EXAM: for EGD pending biopsy results.  eSigned:  Lafayette Dragon, MD 02/14/2014 4:00 PM   CC:  PATIENT NAME:  Gary, Levy MR#: [id number]

## 2014-02-14 NOTE — Progress Notes (Signed)
Called to room to assist during endoscopic procedure.  Patient ID and intended procedure confirmed with present staff. Received instructions for my participation in the procedure from the performing physician.  

## 2014-02-14 NOTE — Patient Instructions (Signed)
YOU HAD AN ENDOSCOPIC PROCEDURE TODAY AT THE [facility]: Refer to the procedure report that was given to you for any specific questions about what was found during the examination.  If the procedure report does not answer your questions, please call your gastroenterologist to clarify.  If you requested that your care partner not be given the details of your procedure findings, then the procedure report has been included in a sealed envelope for you to review at your convenience later.  YOU SHOULD EXPECT: Some feelings of bloating in the abdomen. Passage of more gas than usual.  Walking can help get rid of the air that was put into your GI tract during the procedure and reduce the bloating. If you had a lower endoscopy (such as a colonoscopy or flexible sigmoidoscopy) you may notice spotting of blood in your stool or on the toilet paper. If you underwent a bowel prep for your procedure, then you may not have a normal bowel movement for a few days.  DIET: Your first meal following the procedure should be a light meal and then it is ok to progress to your normal diet.  A half-sandwich or bowl of soup is an example of a good first meal.  Heavy or fried foods are harder to digest and may make you feel nauseous or bloated.  Likewise meals heavy in dairy and vegetables can cause extra gas to form and this can also increase the bloating.  Drink plenty of fluids but you should avoid alcoholic beverages for 24 hours.  ACTIVITY: Your care partner should take you home directly after the procedure.  You should plan to take it easy, moving slowly for the rest of the day.  You can resume normal activity the day after the procedure however you should NOT DRIVE or use heavy machinery for 24 hours (because of the sedation medicines used during the test).    SYMPTOMS TO REPORT IMMEDIATELY: A gastroenterologist can be reached at any hour.  During normal business hours, 8:30 AM to 5:00 PM Monday through Friday,  call [phone].  After hours and on weekends, please call the GI answering service at [phone] who will take a message and have the physician on call contact you.   Following lower endoscopy (colonoscopy or flexible sigmoidoscopy):  Excessive amounts of blood in the stool  Significant tenderness or worsening of abdominal pains  Swelling of the abdomen that is new, acute  Fever of 100F or higher  Following upper endoscopy (EGD)  Vomiting of blood or coffee ground material  New chest pain or pain under the shoulder blades  Painful or persistently difficult swallowing  New shortness of breath  Fever of 100F or higher  Black, tarry-looking stools  FOLLOW UP: If any biopsies were taken you will be contacted by phone or by letter within the next 1-3 weeks.  Call your gastroenterologist if you have not heard about the biopsies in 3 weeks.  Our staff will call the home number listed on your records the next business day following your procedure to check on you and address any questions or concerns that you may have at that time regarding the information given to you following your procedure. This is a courtesy call and so if there is no answer at the home number and we have not heard from you through the emergency physician on call, we will assume that you have returned to your regular daily activities without incident.  SIGNATURES/CONFIDENTIALITY: You and/or your care   partner have signed paperwork which will be entered into your electronic medical record.  These signatures attest to the fact that that the information above on your After Visit Summary has been reviewed and is understood.  Full responsibility of the confidentiality of this discharge information lies with you and/or your care-partner.  Resume medications. Information given on gastritis and esophagitis with discharge instructions.

## 2014-02-14 NOTE — Op Note (Signed)
[facility]  [address]. [place], [id number]   COLONOSCOPY PROCEDURE REPORT  PATIENT: Gary Levy, Gary Levy  MR#: [id number] BIRTHDATE: 1941/01/20 , 73  yrs. old GENDER: Male ENDOSCOPIST: Lafayette Dragon, MD REFERRED GH:WEXHB John, M.D. PROCEDURE DATE:  02/14/2014 PROCEDURE:   Colonoscopy, diagnostic First Screening Colonoscopy - Avg.  risk and is 50 yrs.  old or older - No.  Prior Negative Screening - Now for repeat screening. N/A  History of Adenoma - Now for follow-up colonoscopy & has been > or = to 3 yrs.  N/A  Polyps Removed Today? No.  Recommend repeat exam, <10 yrs? No. ASA CLASS:   Class III INDICATIONS:iiron deficiency anemia.  Chronic renal insufficiency. Heme-negative. MEDICATIONS: MAC sedation, administered by CRNA and propofol (Diprivan) 50mg  IV  DESCRIPTION OF PROCEDURE:   After the risks benefits and alternatives of the procedure were thoroughly explained, informed consent was obtained.  A digital rectal exam revealed no abnormalities of the rectum.   The LB [id number]  endoscope was introduced through the anus and advanced to the cecum, which was identified by both the appendix and ileocecal valve. No adverse events experienced.   The quality of the prep was good, using MoviPrep  The instrument was then slowly withdrawn as the colon was fully examined.      COLON FINDINGS: A normal appearing cecum, ileocecal valve, and appendiceal orifice were identified.  The ascending, hepatic flexure, transverse, splenic flexure, descending, sigmoid colon and rectum appeared unremarkable.  No polyps or cancers were seen. Retroflexed views revealed no abnormalities. The time to cecum=4 minutes 06 seconds.  Withdrawal time=6 minutes 04 seconds.  The scope was withdrawn and the procedure completed. COMPLICATIONS: There were no complications.  ENDOSCOPIC IMPRESSION: Normal colon  RECOMMENDATIONS: anemia due to  iron deficiency likely related to low  grade GI blood loss from the stomach.  Patient will be treated with PPIs and avoidance of anti-inflammatory agents,  he will continue iron supplements   eSigned:  Lafayette Dragon, MD 02/14/2014 4:05 PM   cc:

## 2014-02-14 NOTE — Progress Notes (Signed)
Patient stable. Vss, report to sheila, rn

## 2014-02-15 ENCOUNTER — Telehealth: Payer: Self-pay | Admitting: *Deleted

## 2014-02-15 NOTE — Telephone Encounter (Signed)
  Follow up Call-  Call back number 02/14/2014  Post procedure Call Back phone  # [phone]  Permission to leave phone message Yes     Patient questions:  Phone keeps disconnecting.

## 2014-02-21 ENCOUNTER — Encounter: Payer: Self-pay | Admitting: Internal Medicine

## 2014-02-22 ENCOUNTER — Other Ambulatory Visit: Payer: Self-pay | Admitting: *Deleted

## 2014-02-22 DIAGNOSIS — K2960 Other gastritis without bleeding: Secondary | ICD-10-CM

## 2014-02-22 MED ORDER — BIS SUBCIT-METRONID-TETRACYC 140-125-125 MG PO CAPS: 3.0000 | ORAL_CAPSULE | Freq: Three times a day (TID) | ORAL | Status: DC

## 2014-02-22 MED ORDER — OMEPRAZOLE 20 MG PO CPDR: DELAYED_RELEASE_CAPSULE | ORAL | Status: DC

## 2014-02-23 ENCOUNTER — Encounter: Payer: Self-pay | Admitting: *Deleted

## 2014-03-09 ENCOUNTER — Ambulatory Visit (INDEPENDENT_AMBULATORY_CARE_PROVIDER_SITE_OTHER): Payer: Commercial Managed Care - HMO | Admitting: Cardiovascular Disease

## 2014-03-09 ENCOUNTER — Encounter: Payer: Self-pay | Admitting: Cardiovascular Disease

## 2014-03-09 VITALS — BP 127/81 | HR 61 | Ht 70.0 in | Wt 164.6 lb

## 2014-03-09 DIAGNOSIS — I255 Ischemic cardiomyopathy: Secondary | ICD-10-CM

## 2014-03-09 DIAGNOSIS — I5042 Chronic combined systolic (congestive) and diastolic (congestive) heart failure: Secondary | ICD-10-CM

## 2014-03-09 DIAGNOSIS — I2510 Atherosclerotic heart disease of native coronary artery without angina pectoris: Secondary | ICD-10-CM

## 2014-03-09 DIAGNOSIS — I10 Essential (primary) hypertension: Secondary | ICD-10-CM

## 2014-03-09 DIAGNOSIS — E785 Hyperlipidemia, unspecified: Secondary | ICD-10-CM

## 2014-03-09 DIAGNOSIS — I509 Heart failure, unspecified: Secondary | ICD-10-CM

## 2014-03-09 DIAGNOSIS — I2589 Other forms of chronic ischemic heart disease: Secondary | ICD-10-CM

## 2014-03-09 NOTE — Progress Notes (Signed)
History of Present Illness: 73 yo male with history of CAD s/p 4V CABG in 1995, ischemic cardiomyopathy, HTN, HLD, COPD, CKD here today for cardiac follow up. He has been followed in the past by Dr. Verl Blalock. He has CAD dating back to the 1990s with prior PCI and CABG. The last cath report I can find is scanned into EPIC from 1999 and shows failure of the LIMA graft to the LAD and the SVG to the RCA. There was high grade disease in the SVG to the OM and the SVG to the Diagonal. It appears that the LAD was treated with rotablator atherectomy and stenting in 1999. The SVG to the OM and the SVG to the Diagonal were treated with angioplasty. Most recent stress test in 2010. Echo 2011 with LVEF=35-40%. I saw him 07/28/13 and he had c/o exertional chest pains, resolved with rest. Stress myoview 08/17/13 Intermediate risk study with a fixed large, severe inferolateral, basal to mid inferior, and mid to apical anterior perfusion defect. This suggests prior infarction with no significant ischemia.   He is here today for follow up. He has been feeling better. No chest pain or SOB. Recent upper endoscopy per Dr. Olevia Perches.   Primary Care Physician: Cathlean Cower  Last Lipid Profile:Lipid Panel     Component Value Date/Time   CHOL 128 10/12/2013 1207   TRIG 38.0 10/12/2013 1207   HDL 46.40 10/12/2013 1207   CHOLHDL 3 10/12/2013 1207   VLDL 7.6 10/12/2013 1207   [place] 74 10/12/2013 1207    Past Medical History  Diagnosis Date  . CAD (coronary artery disease) of artery bypass graft   . MI (myocardial infarction)     hx  . CHF (congestive heart failure)   . HLD (hyperlipidemia)   . HTN (hypertension)   . Fatigue   . COPD (chronic obstructive pulmonary disease)   . PSA elevation   . ED (erectile dysfunction) of organic origin   . Routine general medical examination at a health care facility   . Glucose intolerance (impaired glucose tolerance)   . Glaucoma   . Moderate or severe vision impairment, both  eyes, impairment level not further specified   . Postherpetic neuralgia   . Dysuria   . Shingles   . AR (allergic rhinitis)   . Special screening for malignant neoplasm of prostate   . Urinary frequency   . Skin cancer     hx; non-melanoma  . BPH (benign prostatic hypertrophy)   . Anemia, iron deficiency   . Alcohol abuse     hx  . Crohn's disease   . Impaired glucose tolerance 09/27/2011  . CKD (chronic kidney disease) 09/30/2011    Past Surgical History  Procedure Laterality Date  . Coronary artery bypass graft      11+ years ago; has an EF of about 45%, last Myoview showed no ischemia 12/30/06  . Coronary angioplasty with stent placement    . Right cataract  12/11    with lens implant   . Lih  2011    Dr. Rush Farmer     Current Outpatient Prescriptions  Medication Sig Dispense Refill  . aspirin 81 MG EC tablet Take 81 mg by mouth daily.        Marland Kitchen atorvastatin (LIPITOR) 80 MG tablet TAKE 1 TABLET BY MOUTH EVERY DAY  30 tablet  3  . benzonatate (TESSALON PERLES) 100 MG capsule Take 1 capsule (100 mg total) by mouth 2 (two) times daily as  needed for cough.  30 capsule  0  . bismuth-metronidazole-tetracycline (PYLERA) 140-125-125 MG per capsule Take 3 capsules by mouth 4 (four) times daily -  before meals and at bedtime.  120 capsule  0  . carvedilol (COREG) 6.25 MG tablet Take 1 tablet (6.25 mg total) by mouth 2 (two) times daily with a meal.  60 tablet  11  . fexofenadine (ALLEGRA) 180 MG tablet Take 1 tablet (180 mg total) by mouth daily.  30 tablet  11  . Multiple Vitamins-Minerals (CENTRUM PO) Take 1 tablet by mouth daily.        . nitroGLYCERIN (NITROSTAT) 0.4 MG SL tablet Place 1 tablet (0.4 mg total) under the tongue every 5 (five) minutes as needed.  25 tablet  6  . omeprazole (PRILOSEC) 20 MG capsule Take one po BID x 10 days then daily  40 capsule  6  . predniSONE (DELTASONE) 10 MG tablet 4tab by mouth x 3days,3tab x 3days,2tab x 3days,1tab x 3 days  30 tablet  0  .  pregabalin (LYRICA) 50 MG capsule Take 50 mg by mouth at bedtime.        Marland Kitchen spironolactone (ALDACTONE) 25 MG tablet TAKE ONE TABLET BY MOUTH ONCE DAILY  30 tablet  0  . traMADol (ULTRAM) 50 MG tablet Take 50 mg by mouth every 6 (six) hours as needed.        . valsartan-hydrochlorothiazide (DIOVAN-HCT) 320-25 MG per tablet TAKE 1 TABLET EVERY DAY  30 tablet  10   No current facility-administered medications for this visit.    No Known Allergies  History   Social History  . Marital Status: Married    Spouse Name: N/A    Number of Children: N/A  . Years of Education: N/A   Occupational History  . Not on file.   Social History Main Topics  . Smoking status: Former Smoker    Quit date: 03/20/1981  . Smokeless tobacco: Never Used  . Alcohol Use: No  . Drug Use: No  . Sexual Activity: Not on file   Other Topics Concern  . Not on file   Social History Narrative   Married, 4 children; retired - Museum/gallery curator.      Designated party release on file. [employer]. 01/15/10.     Family History  Problem Relation Age of Onset  . Heart disease Mother     Review of Systems:  As stated in the HPI and otherwise negative.   BP 127/81  Pulse 61  Ht 5\' 10"  (1.778 m)  Wt 164 lb 9.6 oz (74.662 kg)  BMI 23.62 kg/m2  Physical Examination: General: Well developed, well nourished, NAD HEENT: OP clear, mucus membranes moist SKIN: warm, dry. No rashes. Neuro: No focal deficits Musculoskeletal: Muscle strength 5/5 all ext Psychiatric: Mood and affect normal Neck: No JVD, no carotid bruits, no thyromegaly, no lymphadenopathy. Lungs:Clear bilaterally, no wheezes, rhonci, crackles Cardiovascular: Regular rate and rhythm. No murmurs, gallops or rubs. Abdomen:Soft. Bowel sounds present. Non-tender.  Extremities: No lower extremity edema. Pulses are 2 + in the bilateral DP/PT.  Echo 03/26/10: Left ventricle: The EF is in the 35-40% range. There is mild global hypokinesis with more marked  hypokinesis of the inferior and posterior walls. - Right ventricle: The cavity size was mildly dilated. Systolic function was mildly reduced. - Right atrium: The atrium was mildly dilated.  Stress myoview 08/17/13: Stress Procedure: The patient received IV Lexiscan 0.4 mg over 15-seconds with concurrent low level exercise and then  Technetium 34m Sestamibi was injected at 30-seconds while the patient continued walking one more minute. Quantitative spect images were obtained after a 45-minute delay. During the infusion of Lexiscan, the patient complained of chest tightness. Symptom resolved in recovery.  Stress ECG: No significant change from baseline ECG  QPS  Raw Data Images: Normal; no motion artifact; normal heart/lung ratio.  Stress Images: Large, severe inferolateral, basal to mid inferior, and mid to apical anterior perfusion defect.  Rest Images: Large, severe inferolateral, basal to mid inferior, and mid to apical anterior perfusion defect  Subtraction (SDS): Fixed large, severe inferolateral, basal to mid inferior, and mid to apical anterior perfusion defect  Transient Ischemic Dilatation (Normal <1.22): 0.88  Lung/Heart Ratio (Normal <0.45): 0.25  Quantitative Gated Spect Images  QGS EDV: 115 ml  QGS ESV: 55 ml  Impression  Exercise Capacity: Lexiscan with low level exercise.  BP Response: Normal blood pressure response.  Clinical Symptoms: Chest tightness.  ECG Impression: No significant ST segment change suggestive of ischemia.  Comparison with Prior Nuclear Study: No significant change from previous study  Overall Impression: Intermediate risk stress nuclear study with a fixed large, severe inferolateral, basal to mid inferior, and mid to apical anterior perfusion defect. This suggests prior infarction with no significant ischemia. Surprisingly, EF calculates to 53% (would consider echo to reassess EF which I would expect to be lower).  LV Ejection Fraction: 53%. LV Wall Motion:  Peri-apical hypokinesis.   Assessment and Plan:   1. CAD: s/p CABG. Stress myoview 08/17/13 with large scar unchanged from 2010, no ischemia.   2. HTN: BP controlled. No changes.   3. Hyperlipidemia: He is on a statin. Lipids well controlled. No changes.   4. Ischemic cardiomyopathy: Last LVEF=53% by stress [facility] December 2014. Continue medical therapy.   5. Chest pain: No recurrence. If he has more chest pain, would try Imdur and consider repeat cath.  6. Chronic systolic CHF: Volume status is stable today. No changes.

## 2014-03-09 NOTE — Patient Instructions (Signed)
Your physician wants you to follow-up in:  6 months. You will receive a reminder letter in the mail two months in advance. If you don't receive a letter, please call our office to schedule the follow-up appointment.   

## 2014-04-11 ENCOUNTER — Other Ambulatory Visit (INDEPENDENT_AMBULATORY_CARE_PROVIDER_SITE_OTHER): Payer: Commercial Managed Care - HMO

## 2014-04-11 ENCOUNTER — Encounter: Payer: Self-pay | Admitting: Internal Medicine

## 2014-04-11 ENCOUNTER — Ambulatory Visit (INDEPENDENT_AMBULATORY_CARE_PROVIDER_SITE_OTHER): Payer: Commercial Managed Care - HMO | Admitting: Internal Medicine

## 2014-04-11 VITALS — BP 120/80 | HR 70 | Temp 97.0°F | Ht 71.0 in | Wt 163.2 lb

## 2014-04-11 DIAGNOSIS — M109 Gout, unspecified: Secondary | ICD-10-CM

## 2014-04-11 DIAGNOSIS — D509 Iron deficiency anemia, unspecified: Secondary | ICD-10-CM

## 2014-04-11 DIAGNOSIS — Z23 Encounter for immunization: Secondary | ICD-10-CM

## 2014-04-11 DIAGNOSIS — N182 Chronic kidney disease, stage 2 (mild): Secondary | ICD-10-CM

## 2014-04-11 DIAGNOSIS — R7309 Other abnormal glucose: Secondary | ICD-10-CM

## 2014-04-11 DIAGNOSIS — R972 Elevated prostate specific antigen [PSA]: Secondary | ICD-10-CM

## 2014-04-11 DIAGNOSIS — R7302 Impaired glucose tolerance (oral): Secondary | ICD-10-CM

## 2014-04-11 DIAGNOSIS — E785 Hyperlipidemia, unspecified: Secondary | ICD-10-CM

## 2014-04-11 DIAGNOSIS — I10 Essential (primary) hypertension: Secondary | ICD-10-CM

## 2014-04-11 LAB — BASIC METABOLIC PANEL
BUN: 41 mg/dL — AB (ref 6–23)
CHLORIDE: 104 meq/L (ref 96–112)
CO2: 26 mEq/L (ref 19–32)
Calcium: 9.7 mg/dL (ref 8.4–10.5)
Creatinine, Ser: 2.6 mg/dL — ABNORMAL HIGH (ref 0.4–1.5)
GFR: 31.37 mL/min — AB (ref >60.00)
GLUCOSE: 98 mg/dL (ref 70–99)
Potassium: 4.8 mEq/L (ref 3.5–5.1)
Sodium: 137 mEq/L (ref 135–145)

## 2014-04-11 LAB — IBC PANEL
Iron: 49 ug/dL (ref 42–165)
SATURATION RATIOS: 15.2 % — AB (ref 20.0–50.0)
TRANSFERRIN: 230.4 mg/dL (ref 212.0–360.0)

## 2014-04-11 LAB — CBC WITH DIFFERENTIAL/PLATELET
BASOS ABS: 0.1 10*3/uL (ref 0.0–0.1)
Basophils Relative: 1.6 % (ref 0.0–3.0)
Eosinophils Absolute: 0.2 10*3/uL (ref 0.0–0.7)
Eosinophils Relative: 4.2 % (ref 0.0–5.0)
HEMATOCRIT: 34.5 % — AB (ref 39.0–52.0)
Hemoglobin: 11.3 g/dL — ABNORMAL LOW (ref 13.0–17.0)
LYMPHS ABS: 1.1 10*3/uL (ref 0.7–4.0)
Lymphocytes Relative: 23.9 % (ref 12.0–46.0)
MCHC: 32.7 g/dL (ref 30.0–36.0)
MCV: 86.7 fl (ref 78.0–100.0)
Monocytes Absolute: 0.8 10*3/uL (ref 0.1–1.0)
Monocytes Relative: 16.8 % — ABNORMAL HIGH (ref 3.0–12.0)
NEUTROS PCT: 53.5 % (ref 43.0–77.0)
Neutro Abs: 2.4 10*3/uL (ref 1.4–7.7)
PLATELETS: 199 10*3/uL (ref 150.0–400.0)
RBC: 3.99 Mil/uL — ABNORMAL LOW (ref 4.22–5.81)
RDW: 14.9 % (ref 11.5–15.5)
WBC: 4.5 10*3/uL (ref 4.0–10.5)

## 2014-04-11 LAB — LIPID PANEL
Cholesterol: 115 mg/dL (ref 0–200)
HDL: 40.6 mg/dL (ref >39.00)
LDL CALC: 67 mg/dL (ref 0–99)
NonHDL: 74.4
TRIGLYCERIDES: 39 mg/dL (ref 0.0–149.0)
Total CHOL/HDL Ratio: 3
VLDL: 7.8 mg/dL (ref 0.0–40.0)

## 2014-04-11 LAB — URIC ACID: URIC ACID, SERUM: 9.8 mg/dL — AB (ref 4.0–7.8)

## 2014-04-11 LAB — HEMOGLOBIN A1C: Hgb A1c MFr Bld: 5.6 % (ref 4.6–6.5)

## 2014-04-11 LAB — PSA: PSA: 5.86 ng/mL — ABNORMAL HIGH (ref 0.10–4.00)

## 2014-04-11 NOTE — Patient Instructions (Addendum)

## 2014-04-11 NOTE — Assessment & Plan Note (Signed)
stable overall by history and exam, recent data reviewed with pt, and pt to continue medical treatment as before,  to f/u any worsening symptoms or concerns Lab Results  Component Value Date   CREATININE 1.8* 10/12/2013

## 2014-04-11 NOTE — Progress Notes (Signed)
Pre visit review using our clinic review tool, if applicable. No additional management support is needed unless otherwise documented below in the visit note. 

## 2014-04-11 NOTE — Assessment & Plan Note (Signed)
.   Lab Results  Component Value Date   LDLCALC 74 10/12/2013   stable overall by history and exam, recent data reviewed with pt, and pt to continue medical treatment as before,  to f/u any worsening symptoms or concerns, for f/u lab

## 2014-04-11 NOTE — Assessment & Plan Note (Signed)
stable overall by history and exam, recent data reviewed with pt, and pt to continue medical treatment as before,  to f/u any worsening symptoms or concerns BP Readings from Last 3 Encounters:  04/11/14 120/80  03/09/14 127/81  02/14/14 102/72

## 2014-04-11 NOTE — Progress Notes (Signed)
Subjective:    Patient ID: Gary Levy, male    DOB: 06/06/1941, 73 y.o.   MRN: [id number]  HPI  Here to f/u; overall doing ok,  Pt denies chest pain, increased sob or doe, wheezing, orthopnea, PND, increased LE swelling, palpitations, dizziness or syncope.  Pt denies polydipsia, polyuria, or low sugar symptoms such as weakness or confusion improved with po intake.  Pt denies new neurological symptoms such as new headache, or facial or extremity weakness or numbness.   Pt states overall good compliance with meds, has been trying to follow lower cholesterol diet, with wt overall stable,  but little exercise however.  Stress test neg for ischemia jan 2015, low normal EF. Colonoscopy June 2015 normal.  Tx for h pylori after egd with hemorrhagic gastritis, esophagitis, was likely source for iron def anemia.  Just spent much of the summer with grandkids in his house with recent stress, 4 boys, all under 55 yo.  Has not yet seen urolgy with hx of mild elev but stable psa. Past Medical History  Diagnosis Date  . CAD (coronary artery disease) of artery bypass graft   . MI (myocardial infarction)     hx  . CHF (congestive heart failure)   . HLD (hyperlipidemia)   . HTN (hypertension)   . Fatigue   . COPD (chronic obstructive pulmonary disease)   . PSA elevation   . ED (erectile dysfunction) of organic origin   . Routine general medical examination at a health care facility   . Glucose intolerance (impaired glucose tolerance)   . Glaucoma   . Moderate or severe vision impairment, both eyes, impairment level not further specified   . Postherpetic neuralgia   . Dysuria   . Shingles   . AR (allergic rhinitis)   . Special screening for malignant neoplasm of prostate   . Urinary frequency   . Skin cancer     hx; non-melanoma  . BPH (benign prostatic hypertrophy)   . Anemia, iron deficiency   . Alcohol abuse     hx  . Crohn's disease   . Impaired glucose tolerance 09/27/2011  . CKD (chronic  kidney disease) 09/30/2011   Past Surgical History  Procedure Laterality Date  . Coronary artery bypass graft      11+ years ago; has an EF of about 45%, last Myoview showed no ischemia 12/30/06  . Coronary angioplasty with stent placement    . Right cataract  12/11    with lens implant   . Lih  2011    Dr. Rush Farmer     reports that he quit smoking about 33 years ago. He has never used smokeless tobacco. He reports that he does not drink alcohol or use illicit drugs. family history includes Heart disease in his mother. No Known Allergies Current Outpatient Prescriptions on File Prior to Visit  Medication Sig Dispense Refill  . aspirin 81 MG EC tablet Take 81 mg by mouth daily.        Marland Kitchen atorvastatin (LIPITOR) 80 MG tablet TAKE 1 TABLET BY MOUTH EVERY DAY  30 tablet  3  . bismuth-metronidazole-tetracycline (PYLERA) 140-125-125 MG per capsule Take 3 capsules by mouth 4 (four) times daily -  before meals and at bedtime.  120 capsule  0  . carvedilol (COREG) 6.25 MG tablet Take 1 tablet (6.25 mg total) by mouth 2 (two) times daily with a meal.  60 tablet  11  . fexofenadine (ALLEGRA) 180 MG tablet Take 1 tablet (180 mg  total) by mouth daily.  30 tablet  11  . Multiple Vitamins-Minerals (CENTRUM PO) Take 1 tablet by mouth daily.        . nitroGLYCERIN (NITROSTAT) 0.4 MG SL tablet Place 1 tablet (0.4 mg total) under the tongue every 5 (five) minutes as needed.  25 tablet  6  . omeprazole (PRILOSEC) 20 MG capsule Take one po BID x 10 days then daily  40 capsule  6  . pregabalin (LYRICA) 50 MG capsule Take 50 mg by mouth at bedtime.        Marland Kitchen spironolactone (ALDACTONE) 25 MG tablet TAKE ONE TABLET BY MOUTH ONCE DAILY  30 tablet  0  . traMADol (ULTRAM) 50 MG tablet Take 50 mg by mouth every 6 (six) hours as needed.        . valsartan-hydrochlorothiazide (DIOVAN-HCT) 320-25 MG per tablet TAKE 1 TABLET EVERY DAY  30 tablet  10   No current facility-administered medications on file prior to visit.     Review of Systems  Constitutional: Negative for unusual diaphoresis or other sweats  HENT: Negative for ringing in ear Eyes: Negative for double vision or worsening visual disturbance.  Respiratory: Negative for choking and stridor.   Gastrointestinal: Negative for vomiting or other signifcant bowel change Genitourinary: Negative for hematuria or decreased urine volume.  Musculoskeletal: Negative for other MSK pain or swelling Skin: Negative for color change and worsening wound.  Neurological: Negative for tremors and numbness other than noted  Psychiatric/Behavioral: Negative for decreased concentration or agitation other than above       Objective:   Physical Exam BP 120/80  Pulse 70  Temp(Src) 97 F (36.1 C) (Oral)  Ht 5\' 11"  (1.803 m)  Wt 163 lb 4 oz (74.05 kg)  BMI 22.78 kg/m2  SpO2 98% VS noted,  Constitutional: Pt appears well-developed, well-nourished.  HENT: Head: NCAT.  Right Ear: External ear normal.  Left Ear: External ear normal.  Eyes: . Pupils are equal, round, and reactive to light. Conjunctivae and EOM are normal Neck: Normal range of motion. Neck supple.  Cardiovascular: Normal rate and regular rhythm.   Pulmonary/Chest: Effort normal and breath sounds decrased, but no rales or wheezing.  Abd:  Soft, NT, ND, + BS Neurological: Pt is alert. Not confused , motor grossly intact Skin: Skin is warm. No rash Psychiatric: Pt behavior is normal. No agitation.     Assessment & Plan:

## 2014-04-11 NOTE — Assessment & Plan Note (Signed)
Asympt, likely improved, for f/u labs today Lab Results  Component Value Date   WBC 5.1 10/12/2013   HGB 11.5* 10/12/2013   HCT 35.5* 10/12/2013   MCV 86.8 10/12/2013   PLT 218.0 10/12/2013

## 2014-04-11 NOTE — Assessment & Plan Note (Signed)
Lab Results  Component Value Date   HGBA1C 5.6 10/12/2013   stable overall by history and exam, recent data reviewed with pt, and pt to continue medical treatment as before,  to f/u any worsening symptoms or concerns, for f/u lab

## 2014-04-11 NOTE — Assessment & Plan Note (Signed)
Has been mild elev but stable, delcined urology, for f/u today

## 2014-04-12 ENCOUNTER — Other Ambulatory Visit: Payer: Self-pay | Admitting: Internal Medicine

## 2014-04-12 DIAGNOSIS — R972 Elevated prostate specific antigen [PSA]: Secondary | ICD-10-CM

## 2014-04-19 ENCOUNTER — Ambulatory Visit (INDEPENDENT_AMBULATORY_CARE_PROVIDER_SITE_OTHER): Payer: Commercial Managed Care - HMO | Admitting: Internal Medicine

## 2014-04-19 ENCOUNTER — Encounter: Payer: Self-pay | Admitting: Internal Medicine

## 2014-04-19 ENCOUNTER — Other Ambulatory Visit (INDEPENDENT_AMBULATORY_CARE_PROVIDER_SITE_OTHER): Payer: Commercial Managed Care - HMO

## 2014-04-19 VITALS — BP 130/78 | HR 52 | Temp 97.8°F | Wt 166.1 lb

## 2014-04-19 DIAGNOSIS — N289 Disorder of kidney and ureter, unspecified: Secondary | ICD-10-CM

## 2014-04-19 DIAGNOSIS — I10 Essential (primary) hypertension: Secondary | ICD-10-CM

## 2014-04-19 DIAGNOSIS — R7302 Impaired glucose tolerance (oral): Secondary | ICD-10-CM

## 2014-04-19 DIAGNOSIS — R7309 Other abnormal glucose: Secondary | ICD-10-CM

## 2014-04-19 DIAGNOSIS — R972 Elevated prostate specific antigen [PSA]: Secondary | ICD-10-CM

## 2014-04-19 LAB — BASIC METABOLIC PANEL
BUN: 17 mg/dL (ref 6–23)
CALCIUM: 10 mg/dL (ref 8.4–10.5)
CO2: 28 mEq/L (ref 19–32)
CREATININE: 1.6 mg/dL — AB (ref 0.4–1.5)
Chloride: 106 mEq/L (ref 96–112)
GFR: 55.48 mL/min — AB (ref >60.00)
Glucose, Bld: 84 mg/dL (ref 70–99)
Potassium: 4.8 mEq/L (ref 3.5–5.1)
Sodium: 139 mEq/L (ref 135–145)

## 2014-04-19 NOTE — Progress Notes (Signed)
Pre visit review using our clinic review tool, if applicable. No additional management support is needed unless otherwise documented below in the visit note. 

## 2014-04-19 NOTE — Patient Instructions (Signed)
OK to stay off the blood pressure medication as per last wk, and no further medication is needed at this time  Please continue all other medications as before, and refills have been done if requested.  Please have the pharmacy call with any other refills you may need.  Please continue to monitor your blood pressure on a regular basis, and call if seems fairly consistently > 140/90  Please continue your efforts at being more active, low cholesterol diet, and weight control.  Please keep your appointments with your specialists as you may have planned - urology, for the elevated PSA  Please go to the LAB in the Basement (turn left off the elevator) for the tests to be done today - just the kidney tests today  You will be contacted by phone if any changes need to be made immediately.  Otherwise, you will receive a letter about your results with an explanation, but please check with MyChart first.  Please remember to sign up for MyChart if you have not done so, as this will be important to you in the future with finding out test results, communicating by private email, and scheduling acute appointments online when needed.

## 2014-04-19 NOTE — Progress Notes (Signed)
Subjective:    Patient ID: Gary Levy, male    DOB: May 29, 1941, 73 y.o.   MRN: [id number]  HPI  Here to f/u, recent AKI finding last wk, with diovan/hct stopped. Pt denies chest pain, increased sob or doe, wheezing, orthopnea, PND, increased LE swelling, palpitations, dizziness or syncope.  Can check BP at home as well.  Denies urinary symptoms such as dysuria, frequency, urgency, flank pain, hematuria or n/v, fever, chills. Was found also with elev PSA.  No new complaints.  Past Medical History  Diagnosis Date  . CAD (coronary artery disease) of artery bypass graft   . MI (myocardial infarction)     hx  . CHF (congestive heart failure)   . HLD (hyperlipidemia)   . HTN (hypertension)   . Fatigue   . COPD (chronic obstructive pulmonary disease)   . PSA elevation   . ED (erectile dysfunction) of organic origin   . Routine general medical examination at a health care facility   . Glucose intolerance (impaired glucose tolerance)   . Glaucoma   . Moderate or severe vision impairment, both eyes, impairment level not further specified   . Postherpetic neuralgia   . Dysuria   . Shingles   . AR (allergic rhinitis)   . Special screening for malignant neoplasm of prostate   . Urinary frequency   . Skin cancer     hx; non-melanoma  . BPH (benign prostatic hypertrophy)   . Anemia, iron deficiency   . Alcohol abuse     hx  . Crohn's disease   . Impaired glucose tolerance 09/27/2011  . CKD (chronic kidney disease) 09/30/2011   Past Surgical History  Procedure Laterality Date  . Coronary artery bypass graft      11+ years ago; has an EF of about 45%, last Myoview showed no ischemia 12/30/06  . Coronary angioplasty with stent placement    . Right cataract  12/11    with lens implant   . Lih  2011    Dr. Rush Farmer     reports that he quit smoking about 33 years ago. He has never used smokeless tobacco. He reports that he does not drink alcohol or use illicit drugs. family history  includes Heart disease in his mother. No Known Allergies Current Outpatient Prescriptions on File Prior to Visit  Medication Sig Dispense Refill  . aspirin 81 MG EC tablet Take 81 mg by mouth daily.        Marland Kitchen atorvastatin (LIPITOR) 80 MG tablet TAKE 1 TABLET BY MOUTH EVERY DAY  30 tablet  3  . bismuth-metronidazole-tetracycline (PYLERA) 140-125-125 MG per capsule Take 3 capsules by mouth 4 (four) times daily -  before meals and at bedtime.  120 capsule  0  . carvedilol (COREG) 6.25 MG tablet Take 1 tablet (6.25 mg total) by mouth 2 (two) times daily with a meal.  60 tablet  11  . fexofenadine (ALLEGRA) 180 MG tablet Take 1 tablet (180 mg total) by mouth daily.  30 tablet  11  . Multiple Vitamins-Minerals (CENTRUM PO) Take 1 tablet by mouth daily.        . nitroGLYCERIN (NITROSTAT) 0.4 MG SL tablet Place 1 tablet (0.4 mg total) under the tongue every 5 (five) minutes as needed.  25 tablet  6  . omeprazole (PRILOSEC) 20 MG capsule Take one po BID x 10 days then daily  40 capsule  6  . pregabalin (LYRICA) 50 MG capsule Take 50 mg by mouth at bedtime.        Marland Kitchen  spironolactone (ALDACTONE) 25 MG tablet TAKE ONE TABLET BY MOUTH ONCE DAILY  30 tablet  0  . traMADol (ULTRAM) 50 MG tablet Take 50 mg by mouth every 6 (six) hours as needed.         No current facility-administered medications on file prior to visit.   Review of Systems  Constitutional: Negative for unusual diaphoresis or other sweats  HENT: Negative for ringing in ear Eyes: Negative for double vision or worsening visual disturbance.  Respiratory: Negative for choking and stridor.   Gastrointestinal: Negative for vomiting or other signifcant bowel change Genitourinary: Negative for hematuria or decreased urine volume.  Musculoskeletal: Negative for other MSK pain or swelling Skin: Negative for color change and worsening wound.  Neurological: Negative for tremors and numbness other than noted  Psychiatric/Behavioral: Negative for  decreased concentration or agitation other than above       Objective:   Physical Exam BP 130/78  Pulse 52  Temp(Src) 97.8 F (36.6 C) (Oral)  Wt 166 lb 2 oz (75.354 kg)  SpO2 99% VS noted, not ill appearing Constitutional: Pt appears well-developed, well-nourished.  HENT: Head: NCAT.  Right Ear: External ear normal.  Left Ear: External ear normal.  Eyes: . Pupils are equal, round, and reactive to light. Conjunctivae and EOM are normal Neck: Normal range of motion. Neck supple.  Cardiovascular: Normal rate and regular rhythm.   Pulmonary/Chest: Effort normal and breath sounds normal.  Abd:  Soft, NT, ND, + BS, no falnk tender Neurological: Pt is alert. Not confused , motor grossly intact Skin: Skin is warm. No rash Psychiatric: Pt behavior is normal. No agitation.     Assessment & Plan:

## 2014-04-23 NOTE — Assessment & Plan Note (Signed)
stable overall by history and exam, recent data reviewed with pt, and pt to continue medical treatment as before,  to f/u any worsening symptoms or concerns Lab Results  Component Value Date   HGBA1C 5.6 04/11/2014

## 2014-04-23 NOTE — Assessment & Plan Note (Addendum)
stable overall by history and exam, recent data reviewed with pt, and pt to continue medical treatment current,  to f/u any worsening symptoms or concerns BP Readings from Last 3 Encounters:  04/19/14 130/78  04/11/14 120/80  03/09/14 127/81

## 2014-04-23 NOTE — Assessment & Plan Note (Signed)
Likely related to med, med stopped last wk, for f/u lab today, consider nephrology if not improved

## 2014-04-23 NOTE — Assessment & Plan Note (Signed)
Asympt, for urology referral 

## 2014-04-26 ENCOUNTER — Other Ambulatory Visit: Payer: Self-pay | Admitting: Cardiovascular Disease

## 2014-05-01 ENCOUNTER — Other Ambulatory Visit: Payer: Self-pay | Admitting: Cardiovascular Disease

## 2014-05-03 ENCOUNTER — Other Ambulatory Visit: Payer: Self-pay | Admitting: *Deleted

## 2014-05-03 MED ORDER — SPIRONOLACTONE 25 MG PO TABS: ORAL_TABLET | ORAL | Status: DC

## 2014-06-01 ENCOUNTER — Telehealth: Payer: Self-pay | Admitting: *Deleted

## 2014-06-01 NOTE — Telephone Encounter (Signed)
Paramed requesting authorization for medical records. Please call [phone]  Case # [id number]

## 2014-08-14 ENCOUNTER — Other Ambulatory Visit: Payer: Self-pay | Admitting: Cardiovascular Disease

## 2014-09-01 ENCOUNTER — Other Ambulatory Visit: Payer: Self-pay | Admitting: Cardiovascular Disease

## 2014-09-04 ENCOUNTER — Other Ambulatory Visit: Payer: Self-pay | Admitting: Cardiovascular Disease

## 2014-09-15 ENCOUNTER — Encounter: Payer: Self-pay | Admitting: Cardiovascular Disease

## 2014-09-15 ENCOUNTER — Ambulatory Visit (INDEPENDENT_AMBULATORY_CARE_PROVIDER_SITE_OTHER): Payer: Commercial Managed Care - HMO | Admitting: Cardiovascular Disease

## 2014-09-15 VITALS — BP 146/86 | HR 54 | Ht 70.5 in | Wt 162.0 lb

## 2014-09-15 DIAGNOSIS — I2510 Atherosclerotic heart disease of native coronary artery without angina pectoris: Secondary | ICD-10-CM

## 2014-09-15 DIAGNOSIS — I255 Ischemic cardiomyopathy: Secondary | ICD-10-CM

## 2014-09-15 DIAGNOSIS — E785 Hyperlipidemia, unspecified: Secondary | ICD-10-CM

## 2014-09-15 DIAGNOSIS — I5042 Chronic combined systolic (congestive) and diastolic (congestive) heart failure: Secondary | ICD-10-CM

## 2014-09-15 DIAGNOSIS — I10 Essential (primary) hypertension: Secondary | ICD-10-CM | POA: Diagnosis not present

## 2014-09-15 NOTE — Patient Instructions (Signed)
Your physician wants you to follow-up in:  12 months.  You will receive a reminder letter in the mail two months in advance. If you don't receive a letter, please call our office to schedule the follow-up appointment.   

## 2014-09-15 NOTE — Progress Notes (Signed)
History of Present Illness: 74 yo male with history of CAD s/p 4V CABG in 1995, ischemic cardiomyopathy, HTN, HLD, COPD, CKD here today for cardiac follow up. Gary Levy has been followed in the past by Dr. Verl Blalock. Gary Levy has CAD dating back to the 1990s with prior PCI and CABG. The last cath report I can find is scanned into EPIC from 1999 and shows failure of the LIMA graft to the LAD and the SVG to the RCA. There was high grade disease in the SVG to the OM and the SVG to the Diagonal. It appears that the LAD was treated with rotablator atherectomy and stenting in 1999. The SVG to the OM and the SVG to the Diagonal were treated with angioplasty. Most recent stress test in 2010. Echo 2011 with LVEF=35-40%. I saw him 07/28/13 and Gary Levy had c/o exertional chest pains, resolved with rest. Stress myoview 08/17/13 Intermediate risk study with a fixed large, severe inferolateral, basal to mid inferior, and mid to apical anterior perfusion defect. This suggested prior infarction with no significant ischemia.   Gary Levy is here today for follow up. No chest pain or SOB. Gary Levy is very active. Gary Levy works in the yard.   Primary Care Physician: Cathlean Cower  Last Lipid Profile:Lipid Panel     Component Value Date/Time   CHOL 115 04/11/2014 1040   TRIG 39.0 04/11/2014 1040   HDL 40.60 04/11/2014 1040   CHOLHDL 3 04/11/2014 1040   VLDL 7.8 04/11/2014 1040   LDLCALC 67 04/11/2014 1040    Past Medical History  Diagnosis Date  . CAD (coronary artery disease) of artery bypass graft   . MI (myocardial infarction)     hx  . CHF (congestive heart failure)   . HLD (hyperlipidemia)   . HTN (hypertension)   . Fatigue   . COPD (chronic obstructive pulmonary disease)   . PSA elevation   . ED (erectile dysfunction) of organic origin   . Routine general medical examination at a health care facility   . Glucose intolerance (impaired glucose tolerance)   . Glaucoma   . Moderate or severe vision impairment, both eyes, impairment level  not further specified   . Postherpetic neuralgia   . Dysuria   . Shingles   . AR (allergic rhinitis)   . Special screening for malignant neoplasm of prostate   . Urinary frequency   . Skin cancer     hx; non-melanoma  . BPH (benign prostatic hypertrophy)   . Anemia, iron deficiency   . Alcohol abuse     hx  . Crohn's disease   . Impaired glucose tolerance 09/27/2011  . CKD (chronic kidney disease) 09/30/2011    Past Surgical History  Procedure Laterality Date  . Coronary artery bypass graft      11+ years ago; has an EF of about 45%, last Myoview showed no ischemia 12/30/06  . Coronary angioplasty with stent placement    . Right cataract  12/11    with lens implant   . Lih  2011    Dr. Rush Farmer     Current Outpatient Prescriptions  Medication Sig Dispense Refill  . aspirin 81 MG EC tablet Take 81 mg by mouth daily.      Marland Kitchen atorvastatin (LIPITOR) 80 MG tablet TAKE 1 TABLET BY MOUTH EVERY DAY 30 tablet 1  . bismuth-metronidazole-tetracycline (PYLERA) 140-125-125 MG per capsule Take 3 capsules by mouth 4 (four) times daily -  before meals and at bedtime. 120 capsule 0  .  carvedilol (COREG) 6.25 MG tablet Take 1 tablet (6.25 mg total) by mouth 2 (two) times daily with a meal. 60 tablet 11  . fexofenadine (ALLEGRA) 180 MG tablet Take 1 tablet (180 mg total) by mouth daily. 30 tablet 11  . Multiple Vitamins-Minerals (CENTRUM PO) Take 1 tablet by mouth daily.      . nitroGLYCERIN (NITROSTAT) 0.4 MG SL tablet Place 1 tablet (0.4 mg total) under the tongue every 5 (five) minutes as needed. (Patient taking differently: Place 0.4 mg under the tongue every 5 (five) minutes as needed for chest pain. ) 25 tablet 6  . omeprazole (PRILOSEC) 20 MG capsule Take 20 mg by mouth daily.    . pregabalin (LYRICA) 50 MG capsule Take 50 mg by mouth at bedtime.      Marland Kitchen spironolactone (ALDACTONE) 25 MG tablet TAKE ONE TABLET BY MOUTH ONCE DAILY 30 tablet 0  . traMADol (ULTRAM) 50 MG tablet Take 50 mg by mouth  every 6 (six) hours as needed (pain).      No current facility-administered medications for this visit.    No Known Allergies  History   Social History  . Marital Status: Married    Spouse Name: N/A    Number of Children: N/A  . Years of Education: N/A   Occupational History  . Not on file.   Social History Main Topics  . Smoking status: Former Smoker    Quit date: 03/20/1981  . Smokeless tobacco: Never Used  . Alcohol Use: No  . Drug Use: No  . Sexual Activity: Not on file   Other Topics Concern  . Not on file   Social History Narrative   Married, 4 children; retired - Museum/gallery curator.      Designated party release on file. [employer]. 01/15/10.     Family History  Problem Relation Age of Onset  . Heart disease Mother     Review of Systems:  As stated in the HPI and otherwise negative.   BP 146/86 mmHg  Pulse 54  Ht 5' 10.5" (1.791 m)  Wt 162 lb (73.483 kg)  BMI 22.91 kg/m2  Physical Examination: General: Well developed, well nourished, NAD HEENT: OP clear, mucus membranes moist SKIN: warm, dry. No rashes. Neuro: No focal deficits Musculoskeletal: Muscle strength 5/5 all ext Psychiatric: Mood and affect normal Neck: No JVD, no carotid bruits, no thyromegaly, no lymphadenopathy. Lungs:Clear bilaterally, no wheezes, rhonci, crackles Cardiovascular: Regular rate and rhythm. No murmurs, gallops or rubs. Abdomen:Soft. Bowel sounds present. Non-tender.  Extremities: No lower extremity edema. Pulses are 2 + in the bilateral DP/PT.  Echo 03/26/10: Left ventricle: The EF is in the 35-40% range. There is mild global hypokinesis with more marked hypokinesis of the inferior and posterior walls. - Right ventricle: The cavity size was mildly dilated. Systolic function was mildly reduced. - Right atrium: The atrium was mildly dilated.  Stress myoview 08/17/13: Stress Procedure: The patient received IV Lexiscan 0.4 mg over 15-seconds with concurrent low level  exercise and then Technetium 32m Sestamibi was injected at 30-seconds while the patient continued walking one more minute. Quantitative spect images were obtained after a 45-minute delay. During the infusion of Lexiscan, the patient complained of chest tightness. Symptom resolved in recovery.  Stress ECG: No significant change from baseline ECG  QPS  Raw Data Images: Normal; no motion artifact; normal heart/lung ratio.  Stress Images: Large, severe inferolateral, basal to mid inferior, and mid to apical anterior perfusion defect.  Rest Images: Large, severe  inferolateral, basal to mid inferior, and mid to apical anterior perfusion defect  Subtraction (SDS): Fixed large, severe inferolateral, basal to mid inferior, and mid to apical anterior perfusion defect  Transient Ischemic Dilatation (Normal <1.22): 0.88  Lung/Heart Ratio (Normal <0.45): 0.25  Quantitative Gated Spect Images  QGS EDV: 115 ml  QGS ESV: 55 ml  Impression  Exercise Capacity: Lexiscan with low level exercise.  BP Response: Normal blood pressure response.  Clinical Symptoms: Chest tightness.  ECG Impression: No significant ST segment change suggestive of ischemia.  Comparison with Prior Nuclear Study: No significant change from previous study  Overall Impression: Intermediate risk stress nuclear study with a fixed large, severe inferolateral, basal to mid inferior, and mid to apical anterior perfusion defect. This suggests prior infarction with no significant ischemia. Surprisingly, EF calculates to 53% (would consider echo to reassess EF which I would expect to be lower).  LV Ejection Fraction: 53%. LV Wall Motion: Peri-apical hypokinesis.    EKG: Sinus brady, rate 54 bpm.   Assessment and Plan:   1. CAD: s/p CABG. Stress myoview 08/17/13 with large scar unchanged from 2010, no ischemia.   2. HTN: BP controlled at home. No changes.   3. Hyperlipidemia: Gary Levy is on a statin. Lipids well controlled. No changes.   4.  Ischemic cardiomyopathy: Last LVEF=53% by stress [facility] December 2014. Continue medical therapy.   5. Chronic systolic CHF: Volume status is stable today. No changes.

## 2014-09-28 ENCOUNTER — Other Ambulatory Visit: Payer: Self-pay | Admitting: Internal Medicine

## 2014-09-28 ENCOUNTER — Telehealth: Payer: Self-pay | Admitting: *Deleted

## 2014-09-28 MED ORDER — OMEPRAZOLE 20 MG PO CPDR: 20.0000 mg | DELAYED_RELEASE_CAPSULE | Freq: Every day | ORAL | Status: DC

## 2014-09-28 NOTE — Telephone Encounter (Signed)
Rx for Omeprazole (PRILOSEC), 20 mg, #30, six (6) refills was sent to [facility] at [address] in [place], [place].

## 2014-09-28 NOTE — Telephone Encounter (Signed)
Called [facility] at [address], [place], [place] at [phone] to let them know that I accidentally sent them two (2) Rx's for Omeprazole (PRILOSEC). Pharmacist said she saw the duplicate order and has thrown out one of the Rx's.

## 2014-10-04 ENCOUNTER — Other Ambulatory Visit: Payer: Self-pay

## 2014-10-04 MED ORDER — SPIRONOLACTONE 25 MG PO TABS: 25.0000 mg | ORAL_TABLET | Freq: Every day | ORAL | Status: DC

## 2014-10-05 ENCOUNTER — Telehealth: Payer: Self-pay | Admitting: Internal Medicine

## 2014-10-05 ENCOUNTER — Other Ambulatory Visit: Payer: Self-pay

## 2014-10-05 MED ORDER — CARVEDILOL 6.25 MG PO TABS: 6.2500 mg | ORAL_TABLET | Freq: Two times a day (BID) | ORAL | Status: DC

## 2014-10-05 MED ORDER — SPIRONOLACTONE 25 MG PO TABS: 25.0000 mg | ORAL_TABLET | Freq: Every day | ORAL | Status: DC

## 2014-10-05 NOTE — Telephone Encounter (Signed)
Sent 30 day until appt with md.../lmb

## 2014-10-05 NOTE — Telephone Encounter (Signed)
Pt called in requesting refill for carvedilol (COREG) 6.25 MG tablet [[id number]].  He has an appt with Dr Jenny Reichmann [facility] Address: [address], [place], [place] [address]  [phone]

## 2014-10-12 ENCOUNTER — Other Ambulatory Visit (INDEPENDENT_AMBULATORY_CARE_PROVIDER_SITE_OTHER): Payer: Commercial Managed Care - HMO

## 2014-10-12 ENCOUNTER — Ambulatory Visit (INDEPENDENT_AMBULATORY_CARE_PROVIDER_SITE_OTHER): Payer: Commercial Managed Care - HMO | Admitting: Internal Medicine

## 2014-10-12 ENCOUNTER — Encounter: Payer: Self-pay | Admitting: Internal Medicine

## 2014-10-12 VITALS — BP 132/88 | HR 50 | Temp 97.8°F | Resp 18 | Ht 70.5 in | Wt 163.0 lb

## 2014-10-12 DIAGNOSIS — Z125 Encounter for screening for malignant neoplasm of prostate: Secondary | ICD-10-CM | POA: Diagnosis not present

## 2014-10-12 DIAGNOSIS — Z0000 Encounter for general adult medical examination without abnormal findings: Secondary | ICD-10-CM | POA: Diagnosis not present

## 2014-10-12 DIAGNOSIS — R7302 Impaired glucose tolerance (oral): Secondary | ICD-10-CM

## 2014-10-12 DIAGNOSIS — R001 Bradycardia, unspecified: Secondary | ICD-10-CM | POA: Insufficient documentation

## 2014-10-12 LAB — CBC WITH DIFFERENTIAL/PLATELET
BASOS PCT: 0.5 % (ref 0.0–3.0)
Basophils Absolute: 0 10*3/uL (ref 0.0–0.1)
EOS ABS: 0.2 10*3/uL (ref 0.0–0.7)
EOS PCT: 2.6 % (ref 0.0–5.0)
HCT: 36.1 % — ABNORMAL LOW (ref 39.0–52.0)
Hemoglobin: 12.1 g/dL — ABNORMAL LOW (ref 13.0–17.0)
LYMPHS PCT: 16.5 % (ref 12.0–46.0)
Lymphs Abs: 1 10*3/uL (ref 0.7–4.0)
MCHC: 33.4 g/dL (ref 30.0–36.0)
MCV: 82.7 fl (ref 78.0–100.0)
Monocytes Absolute: 0.6 10*3/uL (ref 0.1–1.0)
Monocytes Relative: 10.7 % (ref 3.0–12.0)
NEUTROS ABS: 4.1 10*3/uL (ref 1.4–7.7)
Neutrophils Relative %: 69.7 % (ref 43.0–77.0)
Platelets: 188 10*3/uL (ref 150.0–400.0)
RBC: 4.37 Mil/uL (ref 4.22–5.81)
RDW: 15.5 % (ref 11.5–15.5)
WBC: 5.9 10*3/uL (ref 4.0–10.5)

## 2014-10-12 LAB — URINALYSIS, ROUTINE W REFLEX MICROSCOPIC
Bilirubin Urine: NEGATIVE
HGB URINE DIPSTICK: NEGATIVE
Ketones, ur: NEGATIVE
LEUKOCYTES UA: NEGATIVE
NITRITE: NEGATIVE
RBC / HPF: NONE SEEN (ref 0–?)
Specific Gravity, Urine: 1.015 (ref 1.000–1.030)
Total Protein, Urine: NEGATIVE
Urine Glucose: NEGATIVE
Urobilinogen, UA: 0.2 (ref 0.0–1.0)
pH: 6 (ref 5.0–8.0)

## 2014-10-12 LAB — LIPID PANEL
Cholesterol: 120 mg/dL (ref 0–200)
HDL: 40.2 mg/dL (ref >39.00)
LDL Cholesterol: 70 mg/dL (ref 0–99)
NonHDL: 79.8
Total CHOL/HDL Ratio: 3
Triglycerides: 51 mg/dL (ref 0.0–149.0)
VLDL: 10.2 mg/dL (ref 0.0–40.0)

## 2014-10-12 LAB — HEPATIC FUNCTION PANEL
ALT: 22 U/L (ref 0–53)
AST: 25 U/L (ref 0–37)
Albumin: 3.9 g/dL (ref 3.5–5.2)
Alkaline Phosphatase: 69 U/L (ref 39–117)
BILIRUBIN DIRECT: 0.2 mg/dL (ref 0.0–0.3)
TOTAL PROTEIN: 7.1 g/dL (ref 6.0–8.3)
Total Bilirubin: 0.7 mg/dL (ref 0.2–1.2)

## 2014-10-12 LAB — BASIC METABOLIC PANEL
BUN: 13 mg/dL (ref 6–23)
CO2: 30 meq/L (ref 19–32)
CREATININE: 1.38 mg/dL (ref 0.40–1.50)
Calcium: 9.2 mg/dL (ref 8.4–10.5)
Chloride: 108 mEq/L (ref 96–112)
GFR: 64.78 mL/min (ref >60.00)
Glucose, Bld: 99 mg/dL (ref 70–99)
Potassium: 4.1 mEq/L (ref 3.5–5.1)
Sodium: 141 mEq/L (ref 135–145)

## 2014-10-12 LAB — PSA: PSA: 4.08 ng/mL — ABNORMAL HIGH (ref 0.10–4.00)

## 2014-10-12 LAB — HEMOGLOBIN A1C: Hgb A1c MFr Bld: 5.9 % (ref 4.6–6.5)

## 2014-10-12 LAB — TSH: TSH: 1.82 u[IU]/mL (ref 0.35–4.50)

## 2014-10-12 MED ORDER — BENZONATATE 100 MG PO CAPS: 100.0000 mg | ORAL_CAPSULE | Freq: Two times a day (BID) | ORAL | Status: DC | PRN

## 2014-10-12 MED ORDER — FEXOFENADINE HCL 180 MG PO TABS: 180.0000 mg | ORAL_TABLET | Freq: Every day | ORAL | Status: DC

## 2014-10-12 NOTE — Progress Notes (Signed)
Subjective:    Patient ID: Gary Levy, male    DOB: 08-Nov-1940, 74 y.o.   MRN: [id number]  HPI  Here for wellness and f/u;  Overall doing ok;  Pt denies CP, worsening SOB, DOE, wheezing, orthopnea, PND, worsening LE edema, palpitations, dizziness or syncope.  Pt denies neurological change such as new headache, facial or extremity weakness.  Pt denies polydipsia, polyuria, or low sugar symptoms. Pt states overall good compliance with treatment and medications, good tolerability, and has been trying to follow lower cholesterol diet.  Pt denies worsening depressive symptoms, suicidal ideation or panic. No fever, night sweats, wt loss, loss of appetite, or other constitutional symptoms.  Pt states good ability with ADL's, has low fall risk, home safety reviewed and adequate, no other significant changes in hearing or vision, and occasionally active with exercise.  HR ois 50 but aysymptomatic for fatigue, dizziness, abulatory ability. Past Medical History  Diagnosis Date  . CAD (coronary artery disease) of artery bypass graft   . MI (myocardial infarction)     hx  . CHF (congestive heart failure)   . HLD (hyperlipidemia)   . HTN (hypertension)   . Fatigue   . COPD (chronic obstructive pulmonary disease)   . PSA elevation   . ED (erectile dysfunction) of organic origin   . Routine general medical examination at a health care facility   . Glucose intolerance (impaired glucose tolerance)   . Glaucoma   . Moderate or severe vision impairment, both eyes, impairment level not further specified   . Postherpetic neuralgia   . Dysuria   . Shingles   . AR (allergic rhinitis)   . Special screening for malignant neoplasm of prostate   . Urinary frequency   . Skin cancer     hx; non-melanoma  . BPH (benign prostatic hypertrophy)   . Anemia, iron deficiency   . Alcohol abuse     hx  . Crohn's disease   . Impaired glucose tolerance 09/27/2011  . CKD (chronic kidney disease) 09/30/2011   Past  Surgical History  Procedure Laterality Date  . Coronary artery bypass graft      11+ years ago; has an EF of about 45%, last Myoview showed no ischemia 12/30/06  . Coronary angioplasty with stent placement    . Right cataract  12/11    with lens implant   . Lih  2011    Dr. Rush Farmer     reports that he quit smoking about 33 years ago. He has never used smokeless tobacco. He reports that he does not drink alcohol or use illicit drugs. family history includes Heart disease in his mother. No Known Allergies Current Outpatient Prescriptions on File Prior to Visit  Medication Sig Dispense Refill  . aspirin 81 MG EC tablet Take 81 mg by mouth daily.      Marland Kitchen atorvastatin (LIPITOR) 80 MG tablet TAKE 1 TABLET BY MOUTH EVERY DAY 30 tablet 1  . bismuth-metronidazole-tetracycline (PYLERA) 140-125-125 MG per capsule Take 3 capsules by mouth 4 (four) times daily -  before meals and at bedtime. 120 capsule 0  . carvedilol (COREG) 6.25 MG tablet Take 1 tablet (6.25 mg total) by mouth 2 (two) times daily with a meal. 60 tablet 0  . Multiple Vitamins-Minerals (CENTRUM PO) Take 1 tablet by mouth daily.      . nitroGLYCERIN (NITROSTAT) 0.4 MG SL tablet Place 1 tablet (0.4 mg total) under the tongue every 5 (five) minutes as needed. (Patient taking differently: Place  0.4 mg under the tongue every 5 (five) minutes as needed for chest pain. ) 25 tablet 6  . omeprazole (PRILOSEC) 20 MG capsule TAKE 1 CAPSULE (20 MG TOTAL) BY MOUTH DAILY. 30 capsule 6  . omeprazole (PRILOSEC) 20 MG capsule Take 1 capsule (20 mg total) by mouth daily. 30 capsule 6  . pregabalin (LYRICA) 50 MG capsule Take 50 mg by mouth at bedtime.      Marland Kitchen spironolactone (ALDACTONE) 25 MG tablet Take 1 tablet (25 mg total) by mouth daily. 30 tablet 6  . traMADol (ULTRAM) 50 MG tablet Take 50 mg by mouth every 6 (six) hours as needed (pain).      No current facility-administered medications on file prior to visit.   Review of Systems Constitutional:  Negative for increased diaphoresis, other activity, appetite or other siginficant weight change  HENT: Negative for worsening hearing loss, ear pain, facial swelling, mouth sores and neck stiffness.   Eyes: Negative for other worsening pain, redness or visual disturbance.  Respiratory: Negative for shortness of breath and wheezing.   Cardiovascular: Negative for chest pain and palpitations.  Gastrointestinal: Negative for diarrhea, blood in stool, abdominal distention or other pain Genitourinary: Negative for hematuria, flank pain or change in urine volume.  Musculoskeletal: Negative for myalgias or other joint complaints.  Skin: Negative for color change and wound.  Neurological: Negative for syncope and numbness. other than noted Hematological: Negative for adenopathy. or other swelling Psychiatric/Behavioral: Negative for hallucinations, self-injury, decreased concentration or other worsening agitation.      Objective:   Physical Exam  BP 132/88 mmHg  Pulse 50  Temp(Src) 97.8 F (36.6 C) (Oral)  Resp 18  Ht 5' 10.5" (1.791 m)  Wt 163 lb 0.6 oz (73.954 kg)  BMI 23.06 kg/m2  SpO2 96% VS noted,  Constitutional: Pt is oriented to person, place, and time. Appears well-developed and well-nourished.  Head: Normocephalic and atraumatic.  Right Ear: External ear normal.  Left Ear: External ear normal.  Nose: Nose normal.  Mouth/Throat: Oropharynx is clear and moist.  Eyes: Conjunctivae and EOM are normal. Pupils are equal, round, and reactive to light.  Neck: Normal range of motion. Neck supple. No JVD present. No tracheal deviation present.  Cardiovascular: Normal rate, regular rhythm, normal heart sounds and intact distal pulses.   Pulmonary/Chest: Effort normal and breath sounds without rales or wheezing  Abdominal: Soft. Bowel sounds are normal. NT. No HSM  Musculoskeletal: Normal range of motion. Exhibits no edema.  Lymphadenopathy:  Has no cervical adenopathy.  Neurological:  Pt is alert and oriented to person, place, and time. Pt has normal reflexes. No cranial nerve deficit. Motor grossly intact Skin: Skin is warm and dry. No rash noted.  Psychiatric:  Has normal mood and affect. Behavior is normal.     Assessment & Plan:

## 2014-10-12 NOTE — Patient Instructions (Signed)

## 2014-10-12 NOTE — Progress Notes (Signed)
Pre visit review using our clinic review tool, if applicable. No additional management support is needed unless otherwise documented below in the visit note. 

## 2014-10-12 NOTE — Assessment & Plan Note (Signed)
stable overall by history and exam, recent data reviewed with pt, and pt to continue medical treatment as before,  to f/u any worsening symptoms or concerns Lab Results  Component Value Date   HGBA1C 5.6 04/11/2014

## 2014-10-12 NOTE — Assessment & Plan Note (Signed)

## 2014-10-12 NOTE — Assessment & Plan Note (Signed)
asympt on lower dose coreg,  to f/u any worsening symptoms or concerns

## 2014-10-31 ENCOUNTER — Other Ambulatory Visit: Payer: Self-pay | Admitting: Cardiovascular Disease

## 2014-12-07 ENCOUNTER — Other Ambulatory Visit: Payer: Self-pay | Admitting: Internal Medicine

## 2015-04-12 ENCOUNTER — Other Ambulatory Visit (INDEPENDENT_AMBULATORY_CARE_PROVIDER_SITE_OTHER): Payer: Commercial Managed Care - HMO

## 2015-04-12 ENCOUNTER — Ambulatory Visit (INDEPENDENT_AMBULATORY_CARE_PROVIDER_SITE_OTHER): Payer: Commercial Managed Care - HMO | Admitting: Internal Medicine

## 2015-04-12 ENCOUNTER — Encounter: Payer: Self-pay | Admitting: Internal Medicine

## 2015-04-12 VITALS — BP 136/70 | HR 80 | Temp 97.6°F | Resp 12 | Ht 71.0 in | Wt 163.0 lb

## 2015-04-12 DIAGNOSIS — R972 Elevated prostate specific antigen [PSA]: Secondary | ICD-10-CM

## 2015-04-12 DIAGNOSIS — Z23 Encounter for immunization: Secondary | ICD-10-CM

## 2015-04-12 DIAGNOSIS — I10 Essential (primary) hypertension: Secondary | ICD-10-CM | POA: Diagnosis not present

## 2015-04-12 DIAGNOSIS — Z0189 Encounter for other specified special examinations: Secondary | ICD-10-CM

## 2015-04-12 DIAGNOSIS — N182 Chronic kidney disease, stage 2 (mild): Secondary | ICD-10-CM

## 2015-04-12 DIAGNOSIS — Z0000 Encounter for general adult medical examination without abnormal findings: Secondary | ICD-10-CM

## 2015-04-12 DIAGNOSIS — R7302 Impaired glucose tolerance (oral): Secondary | ICD-10-CM | POA: Diagnosis not present

## 2015-04-12 LAB — CBC WITH DIFFERENTIAL/PLATELET
BASOS ABS: 0 10*3/uL (ref 0.0–0.1)
Basophils Relative: 0.9 % (ref 0.0–3.0)
EOS ABS: 0.2 10*3/uL (ref 0.0–0.7)
EOS PCT: 4.3 % (ref 0.0–5.0)
HCT: 39.3 % (ref 39.0–52.0)
HEMOGLOBIN: 13.3 g/dL (ref 13.0–17.0)
LYMPHS ABS: 1.2 10*3/uL (ref 0.7–4.0)
Lymphocytes Relative: 30.3 % (ref 12.0–46.0)
MCHC: 33.7 g/dL (ref 30.0–36.0)
MCV: 85.3 fl (ref 78.0–100.0)
MONO ABS: 0.6 10*3/uL (ref 0.1–1.0)
Monocytes Relative: 15.8 % — ABNORMAL HIGH (ref 3.0–12.0)
NEUTROS PCT: 48.7 % (ref 43.0–77.0)
Neutro Abs: 1.9 10*3/uL (ref 1.4–7.7)
Platelets: 193 10*3/uL (ref 150.0–400.0)
RBC: 4.61 Mil/uL (ref 4.22–5.81)
RDW: 14.6 % (ref 11.5–15.5)
WBC: 4 10*3/uL (ref 4.0–10.5)

## 2015-04-12 LAB — LIPID PANEL
CHOLESTEROL: 130 mg/dL (ref 0–200)
HDL: 41.2 mg/dL (ref >39.00)
LDL CALC: 74 mg/dL (ref 0–99)
NonHDL: 89.12
TRIGLYCERIDES: 74 mg/dL (ref 0.0–149.0)
Total CHOL/HDL Ratio: 3
VLDL: 14.8 mg/dL (ref 0.0–40.0)

## 2015-04-12 LAB — BASIC METABOLIC PANEL
BUN: 18 mg/dL (ref 6–23)
CHLORIDE: 105 meq/L (ref 96–112)
CO2: 30 meq/L (ref 19–32)
Calcium: 9.9 mg/dL (ref 8.4–10.5)
Creatinine, Ser: 1.51 mg/dL — ABNORMAL HIGH (ref 0.40–1.50)
GFR: 58.31 mL/min — AB (ref >60.00)
GLUCOSE: 77 mg/dL (ref 70–99)
POTASSIUM: 4.2 meq/L (ref 3.5–5.1)
SODIUM: 140 meq/L (ref 135–145)

## 2015-04-12 LAB — HEPATIC FUNCTION PANEL
ALBUMIN: 4.1 g/dL (ref 3.5–5.2)
ALK PHOS: 60 U/L (ref 39–117)
ALT: 23 U/L (ref 0–53)
AST: 29 U/L (ref 0–37)
Bilirubin, Direct: 0.2 mg/dL (ref 0.0–0.3)
TOTAL PROTEIN: 7.4 g/dL (ref 6.0–8.3)
Total Bilirubin: 1 mg/dL (ref 0.2–1.2)

## 2015-04-12 LAB — HEMOGLOBIN A1C: Hgb A1c MFr Bld: 5.6 % (ref 4.6–6.5)

## 2015-04-12 LAB — PSA: PSA: 4.71 ng/mL — AB (ref 0.10–4.00)

## 2015-04-12 NOTE — Progress Notes (Signed)
Pre visit review using our clinic review tool, if applicable. No additional management support is needed unless otherwise documented below in the visit note. 

## 2015-04-12 NOTE — Progress Notes (Signed)
Subjective:    Patient ID: Gary Levy, male    DOB: Jul 17, 1941, 74 y.o.   MRN: [id number]  HPI  Here to f/u; overall doing ok,  Pt denies chest pain, increasing sob or doe, wheezing, orthopnea, PND, increased LE swelling, palpitations, dizziness or syncope.  Pt denies new neurological symptoms such as new headache, or facial or extremity weakness or numbness.  Pt denies polydipsia, polyuria, or low sugar episode.   Pt denies new neurological symptoms such as new headache, or facial or extremity weakness or numbness.   Pt states overall good compliance with meds, mostly trying to follow appropriate diet, with wt overall stable,  No new complaints Past Medical History  Diagnosis Date  . CAD (coronary artery disease) of artery bypass graft   . MI (myocardial infarction)     hx  . CHF (congestive heart failure)   . HLD (hyperlipidemia)   . HTN (hypertension)   . Fatigue   . COPD (chronic obstructive pulmonary disease)   . PSA elevation   . ED (erectile dysfunction) of organic origin   . Routine general medical examination at a health care facility   . Glucose intolerance (impaired glucose tolerance)   . Glaucoma   . Moderate or severe vision impairment, both eyes, impairment level not further specified   . Postherpetic neuralgia   . Dysuria   . Shingles   . AR (allergic rhinitis)   . Special screening for malignant neoplasm of prostate   . Urinary frequency   . Skin cancer     hx; non-melanoma  . BPH (benign prostatic hypertrophy)   . Anemia, iron deficiency   . Alcohol abuse     hx  . Crohn's disease   . Impaired glucose tolerance 09/27/2011  . CKD (chronic kidney disease) 09/30/2011   Past Surgical History  Procedure Laterality Date  . Coronary artery bypass graft      11+ years ago; has an EF of about 45%, last Myoview showed no ischemia 12/30/06  . Coronary angioplasty with stent placement    . Right cataract  12/11    with lens implant   . Lih  2011    Dr. Rush Farmer      reports that he quit smoking about 34 years ago. He has never used smokeless tobacco. He reports that he does not drink alcohol or use illicit drugs. family history includes Heart disease in his mother. No Known Allergies Current Outpatient Prescriptions on File Prior to Visit  Medication Sig Dispense Refill  . aspirin 81 MG EC tablet Take 81 mg by mouth daily.      Marland Kitchen atorvastatin (LIPITOR) 80 MG tablet TAKE 1 TABLET BY MOUTH EVERY DAY 30 tablet 10  . benzonatate (TESSALON PERLES) 100 MG capsule Take 1 capsule (100 mg total) by mouth 2 (two) times daily as needed for cough. 60 capsule 3  . bismuth-metronidazole-tetracycline (PYLERA) 140-125-125 MG per capsule Take 3 capsules by mouth 4 (four) times daily -  before meals and at bedtime. 120 capsule 0  . carvedilol (COREG) 6.25 MG tablet Take 1 tablet (6.25 mg total) by mouth 2 (two) times daily with a meal. 60 tablet 0  . carvedilol (COREG) 6.25 MG tablet TAKE ONE TABLET BY MOUTH TWICE DAILY WITH A MEAL 60 tablet 11  . fexofenadine (ALLEGRA) 180 MG tablet Take 1 tablet (180 mg total) by mouth daily. 30 tablet 11  . Multiple Vitamins-Minerals (CENTRUM PO) Take 1 tablet by mouth daily.      Marland Kitchen  nitroGLYCERIN (NITROSTAT) 0.4 MG SL tablet Place 1 tablet (0.4 mg total) under the tongue every 5 (five) minutes as needed. (Patient taking differently: Place 0.4 mg under the tongue every 5 (five) minutes as needed for chest pain. ) 25 tablet 6  . omeprazole (PRILOSEC) 20 MG capsule TAKE 1 CAPSULE (20 MG TOTAL) BY MOUTH DAILY. 30 capsule 6  . omeprazole (PRILOSEC) 20 MG capsule Take 1 capsule (20 mg total) by mouth daily. 30 capsule 6  . pregabalin (LYRICA) 50 MG capsule Take 50 mg by mouth at bedtime.      Marland Kitchen spironolactone (ALDACTONE) 25 MG tablet Take 1 tablet (25 mg total) by mouth daily. 30 tablet 6  . traMADol (ULTRAM) 50 MG tablet Take 50 mg by mouth every 6 (six) hours as needed (pain).      No current facility-administered medications on file  prior to visit.    Review of Systems  Constitutional: Negative for unusual diaphoresis or night sweats HENT: Negative for ringing in ear or discharge Eyes: Negative for double vision or worsening visual disturbance.  Respiratory: Negative for choking and stridor.   Gastrointestinal: Negative for vomiting or other signifcant bowel change Genitourinary: Negative for hematuria or change in urine volume.  Musculoskeletal: Negative for other MSK pain or swelling Skin: Negative for color change and worsening wound.  Neurological: Negative for tremors and numbness other than noted  Psychiatric/Behavioral: Negative for decreased concentration or agitation other than above       Objective:   Physical Exam BP 136/70 mmHg  Pulse 80  Temp(Src) 97.6 F (36.4 C) (Oral)  Resp 12  Ht 5\' 11"  (1.803 m)  Wt 163 lb (73.936 kg)  BMI 22.74 kg/m2  SpO2 96% VS noted,  Constitutional: Pt appears in no significant distress HENT: Head: NCAT.  Right Ear: External ear normal.  Left Ear: External ear normal.  Eyes: . Pupils are equal, round, and reactive to light. Conjunctivae and EOM are normal Neck: Normal range of motion. Neck supple.  Cardiovascular: Normal rate and regular rhythm.   Pulmonary/Chest: Effort normal and breath sounds without rales or wheezing.  Abd:  Soft, NT, ND, + BS Neurological: Pt is alert. Not confused , motor grossly intact Skin: Skin is warm. No rash, no LE edema Psychiatric: Pt behavior is normal. No agitation.     Assessment & Plan:

## 2015-04-12 NOTE — Addendum Note (Signed)
Addended by: Levonne Lapping on: 04/12/2015 10:49 AM   Modules accepted: Orders

## 2015-04-12 NOTE — Assessment & Plan Note (Signed)
stable overall by history and exam, recent data reviewed with pt, and pt to continue medical treatment as before,  to f/u any worsening symptoms or concerns Lab Results  Component Value Date   HGBA1C 5.9 10/12/2014   For f/u lab

## 2015-04-12 NOTE — Assessment & Plan Note (Signed)
psa improved last visit, for f/u psa, o/w asympt

## 2015-04-12 NOTE — Assessment & Plan Note (Signed)
stable overall by history and exam, recent data reviewed with pt, and pt to continue medical treatment as before,  to f/u any worsening symptoms or concerns BP Readings from Last 3 Encounters:  04/12/15 136/70  10/12/14 132/88  09/15/14 146/86

## 2015-04-12 NOTE — Assessment & Plan Note (Signed)
stable overall by history and exam, recent data reviewed with pt, and pt to continue medical treatment as before,  to f/u any worsening symptoms or concerns Lab Results  Component Value Date   WBC 5.9 10/12/2014   HGB 12.1* 10/12/2014   HCT 36.1* 10/12/2014   PLT 188.0 10/12/2014   GLUCOSE 99 10/12/2014   CHOL 120 10/12/2014   TRIG 51.0 10/12/2014   HDL 40.20 10/12/2014   LDLCALC 70 10/12/2014   ALT 22 10/12/2014   AST 25 10/12/2014   NA 141 10/12/2014   K 4.1 10/12/2014   CL 108 10/12/2014   CREATININE 1.38 10/12/2014   BUN 13 10/12/2014   CO2 30 10/12/2014   TSH 1.82 10/12/2014   PSA 4.08* 10/12/2014   HGBA1C 5.9 10/12/2014   MICROALBUR 0.4 03/24/2012

## 2015-04-12 NOTE — Patient Instructions (Signed)

## 2015-05-05 ENCOUNTER — Other Ambulatory Visit: Payer: Self-pay | Admitting: Cardiovascular Disease

## 2015-06-14 ENCOUNTER — Telehealth: Payer: Self-pay | Admitting: Internal Medicine

## 2015-06-14 NOTE — Telephone Encounter (Signed)
Gary Levy from [facility] needing a [facility] referral. Patient's appt is for Monday 10/31

## 2015-06-15 NOTE — Telephone Encounter (Signed)
[facility] referral submitted. Awaiting approval.

## 2015-06-18 DIAGNOSIS — H04123 Dry eye syndrome of bilateral lacrimal glands: Secondary | ICD-10-CM | POA: Diagnosis not present

## 2015-06-18 DIAGNOSIS — H40023 Open angle with borderline findings, high risk, bilateral: Secondary | ICD-10-CM | POA: Diagnosis not present

## 2015-06-18 DIAGNOSIS — H43813 Vitreous degeneration, bilateral: Secondary | ICD-10-CM | POA: Diagnosis not present

## 2015-07-27 ENCOUNTER — Other Ambulatory Visit: Payer: Self-pay | Admitting: *Deleted

## 2015-07-27 MED ORDER — ATORVASTATIN CALCIUM 80 MG PO TABS: 80.0000 mg | ORAL_TABLET | Freq: Every day | ORAL | Status: DC

## 2015-07-27 MED ORDER — SPIRONOLACTONE 25 MG PO TABS: ORAL_TABLET | ORAL | Status: DC

## 2015-07-31 ENCOUNTER — Other Ambulatory Visit: Payer: Self-pay

## 2015-07-31 MED ORDER — CARVEDILOL 6.25 MG PO TABS: 6.2500 mg | ORAL_TABLET | Freq: Two times a day (BID) | ORAL | Status: DC

## 2015-09-11 ENCOUNTER — Encounter: Payer: Self-pay | Admitting: Cardiovascular Disease

## 2015-09-17 DIAGNOSIS — H40023 Open angle with borderline findings, high risk, bilateral: Secondary | ICD-10-CM | POA: Diagnosis not present

## 2015-09-17 DIAGNOSIS — H04123 Dry eye syndrome of bilateral lacrimal glands: Secondary | ICD-10-CM | POA: Diagnosis not present

## 2015-09-24 ENCOUNTER — Ambulatory Visit (INDEPENDENT_AMBULATORY_CARE_PROVIDER_SITE_OTHER): Payer: Commercial Managed Care - HMO | Admitting: Cardiovascular Disease

## 2015-09-24 ENCOUNTER — Encounter: Payer: Self-pay | Admitting: Cardiovascular Disease

## 2015-09-24 VITALS — BP 130/82 | HR 62 | Ht 71.0 in | Wt 159.4 lb

## 2015-09-24 DIAGNOSIS — E785 Hyperlipidemia, unspecified: Secondary | ICD-10-CM

## 2015-09-24 DIAGNOSIS — I2510 Atherosclerotic heart disease of native coronary artery without angina pectoris: Secondary | ICD-10-CM | POA: Diagnosis not present

## 2015-09-24 DIAGNOSIS — I10 Essential (primary) hypertension: Secondary | ICD-10-CM

## 2015-09-24 DIAGNOSIS — I255 Ischemic cardiomyopathy: Secondary | ICD-10-CM | POA: Diagnosis not present

## 2015-09-24 DIAGNOSIS — I5042 Chronic combined systolic (congestive) and diastolic (congestive) heart failure: Secondary | ICD-10-CM | POA: Diagnosis not present

## 2015-09-24 NOTE — Patient Instructions (Signed)

## 2015-09-24 NOTE — Progress Notes (Signed)
Chief Complaint  Patient presents with  . Follow-up  . Coronary Artery Disease     History of Present Illness: 75 yo male with history of CAD s/p 4V CABG in 1995, ischemic cardiomyopathy, HTN, HLD, COPD, CKD here today for cardiac follow up. He has been followed in the past by Dr. Verl Blalock. He has CAD dating back to the 1990s with prior PCI and CABG. The last cath report I can find is scanned into EPIC from 1999 and shows failure of the LIMA graft to the LAD and the SVG to the RCA. There was high grade disease in the SVG to the OM and the SVG to the Diagonal. It appears that the LAD was treated with rotablator atherectomy and stenting in 1999. The SVG to the OM and the SVG to the Diagonal were treated with angioplasty. I saw him 07/28/13 and he had c/o exertional chest pains, resolved with rest. Stress myoview 08/17/13 Intermediate risk study with a fixed large, severe inferolateral, basal to mid inferior, and mid to apical anterior perfusion defect. This suggested prior infarction with no significant ischemia.   He is here today for follow up. No chest pain or SOB. He is very active. He works in the yard. No near syncope or syncope. No LE edema.   Primary Care Physician: Cathlean Cower   Past Medical History  Diagnosis Date  . CAD (coronary artery disease) of artery bypass graft   . MI (myocardial infarction) ([place])     hx  . CHF (congestive heart failure) ([place])   . HLD (hyperlipidemia)   . HTN (hypertension)   . Fatigue   . COPD (chronic obstructive pulmonary disease) ([place])   . PSA elevation   . ED (erectile dysfunction) of organic origin   . Routine general medical examination at a health care facility   . Glucose intolerance (impaired glucose tolerance)   . Glaucoma   . Moderate or severe vision impairment, both eyes, impairment level not further specified   . Postherpetic neuralgia   . Dysuria   . Shingles   . AR (allergic rhinitis)   . Special screening for malignant neoplasm of  prostate   . Urinary frequency   . Skin cancer     hx; non-melanoma  . BPH (benign prostatic hypertrophy)   . Anemia, iron deficiency   . Alcohol abuse     hx  . Crohn's disease ([place])   . Impaired glucose tolerance 09/27/2011  . CKD (chronic kidney disease) 09/30/2011    Past Surgical History  Procedure Laterality Date  . Coronary artery bypass graft      11+ years ago; has an EF of about 45%, last Myoview showed no ischemia 12/30/06  . Coronary angioplasty with stent placement    . Right cataract  12/11    with lens implant   . Lih  2011    Dr. Rush Farmer     Current Outpatient Prescriptions  Medication Sig Dispense Refill  . aspirin 81 MG EC tablet Take 81 mg by mouth daily.      Marland Kitchen atorvastatin (LIPITOR) 80 MG tablet Take 1 tablet (80 mg total) by mouth daily. 90 tablet 0  . benzonatate (TESSALON PERLES) 100 MG capsule Take 1 capsule (100 mg total) by mouth 2 (two) times daily as needed for cough. 60 capsule 3  . bismuth-metronidazole-tetracycline (PYLERA) 140-125-125 MG per capsule Take 3 capsules by mouth 4 (four) times daily -  before meals and at bedtime. 120 capsule 0  .  carvedilol (COREG) 6.25 MG tablet Take 1 tablet (6.25 mg total) by mouth 2 (two) times daily with a meal. 180 tablet 2  . fexofenadine (ALLEGRA) 180 MG tablet Take 1 tablet (180 mg total) by mouth daily. 30 tablet 11  . Multiple Vitamins-Minerals (CENTRUM PO) Take 1 tablet by mouth daily.      . nitroGLYCERIN (NITROSTAT) 0.4 MG SL tablet Place 1 tablet (0.4 mg total) under the tongue every 5 (five) minutes as needed. (Patient taking differently: Place 0.4 mg under the tongue every 5 (five) minutes as needed for chest pain. ) 25 tablet 6  . omeprazole (PRILOSEC) 20 MG capsule Take 1 capsule (20 mg total) by mouth daily. 30 capsule 6  . pregabalin (LYRICA) 50 MG capsule Take 50 mg by mouth at bedtime.      Marland Kitchen spironolactone (ALDACTONE) 25 MG tablet TAKE 1 TABLET (25 MG TOTAL) BY MOUTH DAILY. 90 tablet 0  . traMADol  (ULTRAM) 50 MG tablet Take 50 mg by mouth every 6 (six) hours as needed (pain).      No current facility-administered medications for this visit.    No Known Allergies  Social History   Social History  . Marital Status: Married    Spouse Name: N/A  . Number of Children: N/A  . Years of Education: N/A   Occupational History  . Not on file.   Social History Main Topics  . Smoking status: Former Smoker    Quit date: 03/20/1981  . Smokeless tobacco: Never Used  . Alcohol Use: No  . Drug Use: No  . Sexual Activity: Not on file   Other Topics Concern  . Not on file   Social History Narrative   Married, 4 children; retired - Museum/gallery curator.      Designated party release on file. [employer]. 01/15/10.     Family History  Problem Relation Age of Onset  . Heart disease Mother     Review of Systems:  As stated in the HPI and otherwise negative.   BP 130/82 mmHg  Pulse 62  Ht 5\' 11"  (1.803 m)  Wt 159 lb 6.4 oz (72.303 kg)  BMI 22.24 kg/m2  Physical Examination: General: Well developed, well nourished, NAD HEENT: OP clear, mucus membranes moist SKIN: warm, dry. No rashes. Neuro: No focal deficits Musculoskeletal: Muscle strength 5/5 all ext Psychiatric: Mood and affect normal Neck: No JVD, no carotid bruits, no thyromegaly, no lymphadenopathy. Lungs:Clear bilaterally, no wheezes, rhonci, crackles Cardiovascular: Regular rate and rhythm. No murmurs, gallops or rubs. Abdomen:Soft. Bowel sounds present. Non-tender.  Extremities: No lower extremity edema. Pulses are 2 + in the bilateral DP/PT.  Echo 03/26/10: Left ventricle: The EF is in the 35-40% range. There is mild global hypokinesis with more marked hypokinesis of the inferior and posterior walls. - Right ventricle: The cavity size was mildly dilated. Systolic function was mildly reduced. - Right atrium: The atrium was mildly dilated.  Stress myoview 08/17/13: Stress Procedure: The patient received IV  Lexiscan 0.4 mg over 15-seconds with concurrent low level exercise and then Technetium 72m Sestamibi was injected at 30-seconds while the patient continued walking one more minute. Quantitative spect images were obtained after a 45-minute delay. During the infusion of Lexiscan, the patient complained of chest tightness. Symptom resolved in recovery.  Stress ECG: No significant change from baseline ECG  QPS  Raw Data Images: Normal; no motion artifact; normal heart/lung ratio.  Stress Images: Large, severe inferolateral, basal to mid inferior, and mid to apical  anterior perfusion defect.  Rest Images: Large, severe inferolateral, basal to mid inferior, and mid to apical anterior perfusion defect  Subtraction (SDS): Fixed large, severe inferolateral, basal to mid inferior, and mid to apical anterior perfusion defect  Transient Ischemic Dilatation (Normal <1.22): 0.88  Lung/Heart Ratio (Normal <0.45): 0.25  Quantitative Gated Spect Images  QGS EDV: 115 ml  QGS ESV: 55 ml  Impression  Exercise Capacity: Lexiscan with low level exercise.  BP Response: Normal blood pressure response.  Clinical Symptoms: Chest tightness.  ECG Impression: No significant ST segment change suggestive of ischemia.  Comparison with Prior Nuclear Study: No significant change from previous study  Overall Impression: Intermediate risk stress nuclear study with a fixed large, severe inferolateral, basal to mid inferior, and mid to apical anterior perfusion defect. This suggests prior infarction with no significant ischemia. Surprisingly, EF calculates to 53% (would consider echo to reassess EF which I would expect to be lower).  LV Ejection Fraction: 53%. LV Wall Motion: Peri-apical hypokinesis.   EKG:  EKG is ordered today. The ekg ordered today demonstrates Sinus, rate 75 bpm. Poor R wave progression precordial leads. T wave inversions lateral and anterolateral leads.   Recent Labs: 10/12/2014: TSH 1.82 04/12/2015: ALT 23;  BUN 18; Creatinine, Ser 1.51*; Hemoglobin 13.3; Platelets 193.0; Potassium 4.2; Sodium 140   Lipid Panel    Component Value Date/Time   CHOL 130 04/12/2015 1055   TRIG 74.0 04/12/2015 1055   HDL 41.20 04/12/2015 1055   CHOLHDL 3 04/12/2015 1055   VLDL 14.8 04/12/2015 1055   LDLCALC 74 04/12/2015 1055     Wt Readings from Last 3 Encounters:  09/24/15 159 lb 6.4 oz (72.303 kg)  04/12/15 163 lb (73.936 kg)  10/12/14 163 lb 0.6 oz (73.954 kg)     Other studies Reviewed: Additional studies/ records that were reviewed today include: . Review of the above records demonstrates:    Assessment and Plan:   1. CAD: Stable. He is s/p CABG. Stress myoview 08/17/13 with large scar unchanged from 2010, no ischemia. He is having no exertional chest pain.   2. HTN: BP controlled. No changes.   3. Hyperlipidemia: He is on a statin. Lipids well controlled. No changes.   4. Ischemic cardiomyopathy: Last LVEF=53% by stress [facility] December 2014. Continue medical therapy.   5. Chronic systolic CHF: Volume status is stable today. No changes.   Current medicines are reviewed at length with the patient today.  The patient does not have concerns regarding medicines.  The following changes have been made:  no change  Labs/ tests ordered today include:   Orders Placed This Encounter  Procedures  . EKG 12-Lead    Disposition:   FU with me in 12  months  Signed, Lauree Chandler, MD 09/24/2015 8:38 AM    [facility] [address], [place], [place]  [address] Phone: [phone]; Fax: [phone]

## 2015-10-12 ENCOUNTER — Other Ambulatory Visit (INDEPENDENT_AMBULATORY_CARE_PROVIDER_SITE_OTHER): Payer: Commercial Managed Care - HMO

## 2015-10-12 ENCOUNTER — Ambulatory Visit (INDEPENDENT_AMBULATORY_CARE_PROVIDER_SITE_OTHER): Payer: Commercial Managed Care - HMO | Admitting: Internal Medicine

## 2015-10-12 ENCOUNTER — Encounter: Payer: Self-pay | Admitting: Internal Medicine

## 2015-10-12 VITALS — BP 128/80 | HR 91 | Temp 98.7°F | Resp 20 | Wt 161.0 lb

## 2015-10-12 DIAGNOSIS — Z0000 Encounter for general adult medical examination without abnormal findings: Secondary | ICD-10-CM

## 2015-10-12 DIAGNOSIS — R7302 Impaired glucose tolerance (oral): Secondary | ICD-10-CM

## 2015-10-12 DIAGNOSIS — J309 Allergic rhinitis, unspecified: Secondary | ICD-10-CM

## 2015-10-12 DIAGNOSIS — I10 Essential (primary) hypertension: Secondary | ICD-10-CM | POA: Diagnosis not present

## 2015-10-12 LAB — URINALYSIS, ROUTINE W REFLEX MICROSCOPIC
Bilirubin Urine: NEGATIVE
HGB URINE DIPSTICK: NEGATIVE
Ketones, ur: NEGATIVE
Leukocytes, UA: NEGATIVE
NITRITE: NEGATIVE
Specific Gravity, Urine: 1.01 (ref 1.000–1.030)
Total Protein, Urine: NEGATIVE
URINE GLUCOSE: NEGATIVE
Urobilinogen, UA: 0.2 (ref 0.0–1.0)
pH: 6 (ref 5.0–8.0)

## 2015-10-12 LAB — CBC WITH DIFFERENTIAL/PLATELET
BASOS PCT: 0.7 % (ref 0.0–3.0)
Basophils Absolute: 0 10*3/uL (ref 0.0–0.1)
EOS ABS: 0.2 10*3/uL (ref 0.0–0.7)
EOS PCT: 4.7 % (ref 0.0–5.0)
HCT: 41.4 % (ref 39.0–52.0)
Hemoglobin: 13.9 g/dL (ref 13.0–17.0)
LYMPHS ABS: 1.3 10*3/uL (ref 0.7–4.0)
Lymphocytes Relative: 25.1 % (ref 12.0–46.0)
MCHC: 33.5 g/dL (ref 30.0–36.0)
MCV: 84.8 fl (ref 78.0–100.0)
MONO ABS: 0.9 10*3/uL (ref 0.1–1.0)
Monocytes Relative: 17.6 % — ABNORMAL HIGH (ref 3.0–12.0)
NEUTROS ABS: 2.6 10*3/uL (ref 1.4–7.7)
NEUTROS PCT: 51.9 % (ref 43.0–77.0)
PLATELETS: 198 10*3/uL (ref 150.0–400.0)
RBC: 4.88 Mil/uL (ref 4.22–5.81)
RDW: 14.7 % (ref 11.5–15.5)
WBC: 5.1 10*3/uL (ref 4.0–10.5)

## 2015-10-12 LAB — HEMOGLOBIN A1C: Hgb A1c MFr Bld: 5.8 % (ref 4.6–6.5)

## 2015-10-12 LAB — HEPATIC FUNCTION PANEL
ALK PHOS: 65 U/L (ref 39–117)
ALT: 23 U/L (ref 0–53)
AST: 27 U/L (ref 0–37)
Albumin: 4.4 g/dL (ref 3.5–5.2)
BILIRUBIN TOTAL: 1 mg/dL (ref 0.2–1.2)
Bilirubin, Direct: 0.2 mg/dL (ref 0.0–0.3)
Total Protein: 8.1 g/dL (ref 6.0–8.3)

## 2015-10-12 LAB — LIPID PANEL
CHOL/HDL RATIO: 3
Cholesterol: 141 mg/dL (ref 0–200)
HDL: 44.5 mg/dL (ref >39.00)
LDL CALC: 75 mg/dL (ref 0–99)
NonHDL: 96.78
TRIGLYCERIDES: 109 mg/dL (ref 0.0–149.0)
VLDL: 21.8 mg/dL (ref 0.0–40.0)

## 2015-10-12 LAB — BASIC METABOLIC PANEL
BUN: 22 mg/dL (ref 6–23)
CO2: 31 mEq/L (ref 19–32)
CREATININE: 1.57 mg/dL — AB (ref 0.40–1.50)
Calcium: 10.4 mg/dL (ref 8.4–10.5)
Chloride: 102 mEq/L (ref 96–112)
GFR: 55.67 mL/min — ABNORMAL LOW (ref >60.00)
GLUCOSE: 98 mg/dL (ref 70–99)
Potassium: 5.5 mEq/L — ABNORMAL HIGH (ref 3.5–5.1)
Sodium: 137 mEq/L (ref 135–145)

## 2015-10-12 LAB — TSH: TSH: 2.64 u[IU]/mL (ref 0.35–4.50)

## 2015-10-12 LAB — PSA: PSA: 4.6 ng/mL — AB (ref 0.10–4.00)

## 2015-10-12 MED ORDER — BENZONATATE 100 MG PO CAPS: 100.0000 mg | ORAL_CAPSULE | Freq: Two times a day (BID) | ORAL | Status: DC | PRN

## 2015-10-12 MED ORDER — PREDNISONE 10 MG PO TABS: ORAL_TABLET | ORAL | Status: DC

## 2015-10-12 MED ORDER — METHYLPREDNISOLONE ACETATE 80 MG/ML IJ SUSP
80.0000 mg | Freq: Once | INTRAMUSCULAR | Status: AC
  Administered 2015-10-12: 80 mg via INTRAMUSCULAR

## 2015-10-12 NOTE — Patient Instructions (Addendum)
You had the steroid shot today  Please take all new medication as prescribed  - the prednisone (sent to [place] [facility])  You should also take OTC Zaditor (eye drops for allergies), OTC zyrtec 10 mg per day and Nasacort AQ for allergies as well  Please continue all other medications as before, and refills have been done if requested - the tessalon perle  Please have the pharmacy call with any other refills you may need.  Please continue your efforts at being more active, low cholesterol diet, and weight control.  You are otherwise up to date with prevention measures today.  Please keep your appointments with your specialists as you may have planned  Please go to the LAB in the Basement (turn left off the elevator) for the tests to be done today  You will be contacted by phone if any changes need to be made immediately.  Otherwise, you will receive a letter about your results with an explanation, but please check with MyChart first.  Please remember to sign up for MyChart if you have not done so, as this will be important to you in the future with finding out test results, communicating by private email, and scheduling acute appointments online when needed.  Please return in 1 year for your yearly visit, or sooner if needed, with Lab testing done 3-5 days before

## 2015-10-12 NOTE — Progress Notes (Signed)
Subjective:    Patient ID: Gary Levy, male    DOB: 01/11/1941, 75 y.o.   MRN: [id number]  HPI  Here for wellness and f/u;  Overall doing ok;  Pt denies Chest pain, worsening SOB, DOE, wheezing, orthopnea, PND, worsening LE edema, palpitations, dizziness or syncope.  Pt denies neurological change such as new headache, facial or extremity weakness.  Pt denies polydipsia, polyuria, or low sugar symptoms. Pt states overall good compliance with treatment and medications, good tolerability, and has been trying to follow appropriate diet.  Pt denies worsening depressive symptoms, suicidal ideation or panic. No fever, night sweats, wt loss, loss of appetite, or other constitutional symptoms.  Pt states good ability with ADL's, has low fall risk, home safety reviewed and adequate, no other significant changes in hearing or vision, and only occasionally active with exercise.  Does have several wks ongoing nasal allergy symptoms with clearish congestion, itch and sneezing, without fever, pain, ST, cough, swelling or wheezing, has marked eye swelling and d/c as well.   Past Medical History  Diagnosis Date  . CAD (coronary artery disease) of artery bypass graft   . MI (myocardial infarction) ([place])     hx  . CHF (congestive heart failure) ([place])   . HLD (hyperlipidemia)   . HTN (hypertension)   . Fatigue   . COPD (chronic obstructive pulmonary disease) ([place])   . PSA elevation   . ED (erectile dysfunction) of organic origin   . Routine general medical examination at a health care facility   . Glucose intolerance (impaired glucose tolerance)   . Glaucoma   . Moderate or severe vision impairment, both eyes, impairment level not further specified   . Postherpetic neuralgia   . Dysuria   . Shingles   . AR (allergic rhinitis)   . Special screening for malignant neoplasm of prostate   . Urinary frequency   . Skin cancer     hx; non-melanoma  . BPH (benign prostatic hypertrophy)   . Anemia, iron  deficiency   . Alcohol abuse     hx  . Crohn's disease ([place])   . Impaired glucose tolerance 09/27/2011  . CKD (chronic kidney disease) 09/30/2011   Past Surgical History  Procedure Laterality Date  . Coronary artery bypass graft      11+ years ago; has an EF of about 45%, last Myoview showed no ischemia 12/30/06  . Coronary angioplasty with stent placement    . Right cataract  12/11    with lens implant   . Lih  2011    Dr. Rush Farmer     reports that he quit smoking about 34 years ago. He has never used smokeless tobacco. He reports that he does not drink alcohol or use illicit drugs. family history includes Heart disease in his mother. No Known Allergies Current Outpatient Prescriptions on File Prior to Visit  Medication Sig Dispense Refill  . aspirin 81 MG EC tablet Take 81 mg by mouth daily.      Marland Kitchen atorvastatin (LIPITOR) 80 MG tablet Take 1 tablet (80 mg total) by mouth daily. 90 tablet 0  . bismuth-metronidazole-tetracycline (PYLERA) 140-125-125 MG per capsule Take 3 capsules by mouth 4 (four) times daily -  before meals and at bedtime. 120 capsule 0  . carvedilol (COREG) 6.25 MG tablet Take 1 tablet (6.25 mg total) by mouth 2 (two) times daily with a meal. 180 tablet 2  . Multiple Vitamins-Minerals (CENTRUM PO) Take 1 tablet by mouth daily.      Marland Kitchen  nitroGLYCERIN (NITROSTAT) 0.4 MG SL tablet Place 1 tablet (0.4 mg total) under the tongue every 5 (five) minutes as needed. (Patient taking differently: Place 0.4 mg under the tongue every 5 (five) minutes as needed for chest pain. ) 25 tablet 6  . omeprazole (PRILOSEC) 20 MG capsule Take 1 capsule (20 mg total) by mouth daily. 30 capsule 6  . pregabalin (LYRICA) 50 MG capsule Take 50 mg by mouth at bedtime.      Marland Kitchen spironolactone (ALDACTONE) 25 MG tablet TAKE 1 TABLET (25 MG TOTAL) BY MOUTH DAILY. 90 tablet 0  . traMADol (ULTRAM) 50 MG tablet Take 50 mg by mouth every 6 (six) hours as needed (pain).      No current facility-administered  medications on file prior to visit.     Review of Systems Constitutional: Negative for increased diaphoresis, other activity, appetite or siginficant weight change other than noted HENT: Negative for worsening hearing loss, ear pain, facial swelling, mouth sores and neck stiffness.   Eyes: Negative for other worsening pain, redness or visual disturbance.  Respiratory: Negative for shortness of breath and wheezing  Cardiovascular: Negative for chest pain and palpitations.  Gastrointestinal: Negative for diarrhea, blood in stool, abdominal distention or other pain Genitourinary: Negative for hematuria, flank pain or change in urine volume.  Musculoskeletal: Negative for myalgias or other joint complaints.  Skin: Negative for color change and wound or drainage.  Neurological: Negative for syncope and numbness. other than noted Hematological: Negative for adenopathy. or other swelling Psychiatric/Behavioral: Negative for hallucinations, SI, self-injury, decreased concentration or other worsening agitation.      Objective:   Physical Exam BP 128/80 mmHg  Pulse 91  Temp(Src) 98.7 F (37.1 C) (Oral)  Resp 20  Wt 161 lb (73.029 kg)  SpO2 94% VS noted,  Constitutional: Pt is oriented to person, place, and time. Appears well-developed and well-nourished, in no significant distress Head: Normocephalic and atraumatic.  Right Ear: External ear normal.  Left Ear: External ear normal.  Nose: Nose normal.  Mouth/Throat: Oropharynx is clear and moist.  Eyes: Conjunctivae with erythema and bilat upper and lower eyelid trace edema, and EOM are normal. Pupils are equal, round, and reactive to light.  Bilat tm's with mild erythema.  Max sinus areas non tender.  Pharynx with mild erythema, no exudate Neck: Normal range of motion. Neck supple. No JVD present. No tracheal deviation present or significant neck LA or mass Cardiovascular: Normal rate, regular rhythm, normal heart sounds and intact distal  pulses.   Pulmonary/Chest: Effort normal and breath sounds without rales or wheezing  Abdominal: Soft. Bowel sounds are normal. NT. No HSM  Musculoskeletal: Normal range of motion. Exhibits no edema.  Lymphadenopathy:  Has no cervical adenopathy.  Neurological: Pt is alert and oriented to person, place, and time. Pt has normal reflexes. No cranial nerve deficit. Motor grossly intact Skin: Skin is warm and dry. No rash noted.  Psychiatric:  Has normal mood and affect. Behavior is normal.   Lab Results  Component Value Date   WBC 4.0 04/12/2015   HGB 13.3 04/12/2015   HCT 39.3 04/12/2015   PLT 193.0 04/12/2015   GLUCOSE 77 04/12/2015   CHOL 130 04/12/2015   TRIG 74.0 04/12/2015   HDL 41.20 04/12/2015   LDLCALC 74 04/12/2015   ALT 23 04/12/2015   AST 29 04/12/2015   NA 140 04/12/2015   K 4.2 04/12/2015   CL 105 04/12/2015   CREATININE 1.51* 04/12/2015   BUN 18 04/12/2015  CO2 30 04/12/2015   TSH 1.82 10/12/2014   PSA 4.71* 04/12/2015   HGBA1C 5.6 04/12/2015   MICROALBUR 0.4 03/24/2012       Assessment & Plan:

## 2015-10-12 NOTE — Progress Notes (Signed)
Pre visit review using our clinic review tool, if applicable. No additional management support is needed unless otherwise documented below in the visit note. 

## 2015-10-13 NOTE — Assessment & Plan Note (Signed)
Mod uncontrolled - for depomedrol IM, predpac asd, zyrtec and nasacort aq otc prn, and otc zaditor prn as well,  to f/u any worsening symptoms or concerns

## 2015-10-13 NOTE — Assessment & Plan Note (Signed)
stable overall by history and exam, recent data reviewed with pt, and pt to continue medical treatment as before,  to f/u any worsening symptoms or concerns BP Readings from Last 3 Encounters:  10/12/15 128/80  09/24/15 130/82  04/12/15 136/70

## 2015-10-13 NOTE — Assessment & Plan Note (Signed)
stable overall by history and exam, recent data reviewed with pt, and pt to continue medical treatment as before,  to f/u any worsening symptoms or concerns Lab Results  Component Value Date   HGBA1C 5.8 10/12/2015

## 2015-10-13 NOTE — Assessment & Plan Note (Signed)

## 2015-11-08 ENCOUNTER — Other Ambulatory Visit: Payer: Self-pay | Admitting: Cardiovascular Disease

## 2015-11-08 NOTE — Telephone Encounter (Signed)
REFILL 

## 2016-03-14 ENCOUNTER — Telehealth: Payer: Self-pay | Admitting: Internal Medicine

## 2016-03-14 NOTE — Telephone Encounter (Signed)
Patient Name: GARRETT FRALIX  DOB: [birth year]    Initial Comment Caller states husband is in severe pain in his foot   Nurse Assessment  Nurse: Mallie Mussel, RN, Alveta Heimlich Date/Time (Eastern Time): 03/14/2016 2:02:05 PM  Confirm and document reason for call. If symptomatic, describe symptoms. You must click the next button to save text entered. ---Patient states that he has severe pain in his left foot and has swelling. He has a history of gout and he thinks that it has come back. He rates his pain as 8 on [id number] scale with certain movements. At times, he has shooting pains with this.  Has the patient traveled out of the country within the last 30 days? ---No  Does the patient have any new or worsening symptoms? ---Yes  Will a triage be completed? ---Yes  Related visit to physician within the last 2 weeks? ---No  Does the PT have any chronic conditions? (i.e. diabetes, asthma, etc.) ---No  Is this a behavioral health or substance abuse call? ---No     Guidelines    Guideline Title Affirmed Question Affirmed Notes  Foot Pain [1] Redness of the skin AND [2] no fever    Final Disposition User   See Physician within [place], RN, Alveta Heimlich    Referrals  REFERRED TO PCP OFFICE   Disagree/Comply: Leta Baptist

## 2016-03-14 NOTE — Telephone Encounter (Signed)
If possible, could pt be called to be seen at [facility]? Thanks  O/w will ask staff to call pt for appt on Monday july 31

## 2016-03-15 NOTE — Telephone Encounter (Signed)
FYI: This pt did not get put on the schedule for Saturday clinic.

## 2016-03-16 NOTE — Telephone Encounter (Signed)
Coriinne, to please contact pt to see if still needs appt in office Monday, to see any md, thanks

## 2016-03-17 NOTE — Telephone Encounter (Signed)
Called patient.  He only wants to see Dr. Jenny Reichmann.  Got him in for 8/1.

## 2016-03-18 ENCOUNTER — Encounter: Payer: Self-pay | Admitting: Internal Medicine

## 2016-03-18 ENCOUNTER — Ambulatory Visit (INDEPENDENT_AMBULATORY_CARE_PROVIDER_SITE_OTHER): Payer: Commercial Managed Care - HMO | Admitting: Internal Medicine

## 2016-03-18 VITALS — BP 132/70 | HR 63 | Temp 98.0°F | Wt 164.0 lb

## 2016-03-18 DIAGNOSIS — M10079 Idiopathic gout, unspecified ankle and foot: Secondary | ICD-10-CM | POA: Diagnosis not present

## 2016-03-18 DIAGNOSIS — M109 Gout, unspecified: Secondary | ICD-10-CM

## 2016-03-18 DIAGNOSIS — I10 Essential (primary) hypertension: Secondary | ICD-10-CM | POA: Diagnosis not present

## 2016-03-18 MED ORDER — ALLOPURINOL 100 MG PO TABS: 100.0000 mg | ORAL_TABLET | Freq: Every day | ORAL | 3 refills | Status: DC

## 2016-03-18 MED ORDER — TRAMADOL HCL 50 MG PO TABS: 50.0000 mg | ORAL_TABLET | Freq: Four times a day (QID) | ORAL | 1 refills | Status: DC | PRN

## 2016-03-18 MED ORDER — METHYLPREDNISOLONE ACETATE 80 MG/ML IJ SUSP
80.0000 mg | Freq: Once | INTRAMUSCULAR | Status: AC
  Administered 2016-03-18: 80 mg via INTRAMUSCULAR

## 2016-03-18 MED ORDER — PREDNISONE 10 MG PO TABS: ORAL_TABLET | ORAL | 0 refills | Status: DC

## 2016-03-18 NOTE — Progress Notes (Signed)
Subjective:    Patient ID: Gary Levy, male    DOB: [birth year], 75 y.o.   MRN: [id number]  HPI  Here to f/u with acute onset 1 wk left first MTP red/tender/swelling/erythema sudden onset moderate sharp constant pain, worse to walk, better to sit, nothing else makes better or worse.   Pt denies fever, wt loss, night sweats, loss of appetite, or other constitutional symptoms  Pt denies chest pain, increased sob or doe, wheezing, orthopnea, PND, increased LE swelling, palpitations, dizziness or syncope.  Pt denies new neurological symptoms such as new headache, or facial or extremity weakness or numbness   Pt denies polydipsia, polyuria, Past Medical History:  Diagnosis Date  . Alcohol abuse    hx  . Anemia, iron deficiency   . AR (allergic rhinitis)   . BPH (benign prostatic hypertrophy)   . CAD (coronary artery disease) of artery bypass graft   . CHF (congestive heart failure) ([place])   . CKD (chronic kidney disease) 09/30/2011  . COPD (chronic obstructive pulmonary disease) ([place])   . Crohn's disease ([place])   . Dysuria   . ED (erectile dysfunction) of organic origin   . Fatigue   . Glaucoma   . Glucose intolerance (impaired glucose tolerance)   . HLD (hyperlipidemia)   . HTN (hypertension)   . Impaired glucose tolerance 09/27/2011  . MI (myocardial infarction) ([place])    hx  . Moderate or severe vision impairment, both eyes, impairment level not further specified   . Postherpetic neuralgia   . PSA elevation   . Routine general medical examination at a health care facility   . Shingles   . Skin cancer    hx; non-melanoma  . Special screening for malignant neoplasm of prostate   . Urinary frequency    Past Surgical History:  Procedure Laterality Date  . CORONARY ANGIOPLASTY WITH STENT PLACEMENT    . CORONARY ARTERY BYPASS GRAFT     11+ years ago; has an EF of about 45%, last Myoview showed no ischemia 12/30/06  . [facility]  2011   Dr. Rush Farmer   . right cataract  12/11   with lens  implant     reports that he quit smoking about 35 years ago. He has never used smokeless tobacco. He reports that he does not drink alcohol or use drugs. family history includes Heart disease in his mother. No Known Allergies Current Outpatient Prescriptions on File Prior to Visit  Medication Sig Dispense Refill  . aspirin 81 MG EC tablet Take 81 mg by mouth daily.      Marland Kitchen atorvastatin (LIPITOR) 80 MG tablet TAKE 1 TABLET DAILY. 90 tablet 3  . benzonatate (TESSALON PERLES) 100 MG capsule Take 1 capsule (100 mg total) by mouth 2 (two) times daily as needed for cough. 60 capsule 3  . bismuth-metronidazole-tetracycline (PYLERA) 140-125-125 MG per capsule Take 3 capsules by mouth 4 (four) times daily -  before meals and at bedtime. 120 capsule 0  . carvedilol (COREG) 6.25 MG tablet Take 1 tablet (6.25 mg total) by mouth 2 (two) times daily with a meal. 180 tablet 2  . Multiple Vitamins-Minerals (CENTRUM PO) Take 1 tablet by mouth daily.      . nitroGLYCERIN (NITROSTAT) 0.4 MG SL tablet Place 1 tablet (0.4 mg total) under the tongue every 5 (five) minutes as needed. (Patient taking differently: Place 0.4 mg under the tongue every 5 (five) minutes as needed for chest pain. ) 25 tablet 6  . omeprazole (PRILOSEC)  20 MG capsule Take 1 capsule (20 mg total) by mouth daily. 30 capsule 6  . pregabalin (LYRICA) 50 MG capsule Take 50 mg by mouth at bedtime.      Marland Kitchen spironolactone (ALDACTONE) 25 MG tablet TAKE 1 TABLET EVERY DAY 90 tablet 3   No current facility-administered medications on file prior to visit.    Review of Systems All otherwise neg per pt     Objective:   Physical Exam BP 132/70   Pulse 63   Temp 98 F (36.7 C) (Oral)   Wt 164 lb (74.4 kg)   SpO2 97%   BMI 22.87 kg/m  VS noted,  Constitutional: Pt appears in no apparent distress HENT: Head: NCAT.  Right Ear: External ear normal.  Left Ear: External ear normal.  Eyes: . Pupils are equal, round, and reactive to light.  Conjunctivae and EOM are normal Neck: Normal range of motion. Neck supple.  Cardiovascular: Normal rate and regular rhythm.   Pulmonary/Chest: Effort normal and breath sounds without rales or wheezing.  Neurological: Pt is alert. Not confused , motor grossly intact Skin: Skin is warm. No rash, no LE edema except for left first MTP 1-2+ red/tender/swelling Psychiatric: Pt behavior is normal. No agitation.     Assessment & Plan:

## 2016-03-18 NOTE — Patient Instructions (Signed)
You had the steroid shot today  Please take all new medication as prescribed - the pain medication and the prednisone  Please continue all other medications as before, and refills have been done if requested.  Please have the pharmacy call with any other refills you may need.  Please keep your appointments with your specialists as you may have planned

## 2016-03-18 NOTE — Progress Notes (Signed)
Pre visit review using our clinic review tool, if applicable. No additional management support is needed unless otherwise documented below in the visit note. 

## 2016-03-23 NOTE — Assessment & Plan Note (Signed)
stable overall by history and exam, recent data reviewed with pt, and pt to continue medical treatment as before,  to f/u any worsening symptoms or concerns BP Readings from Last 3 Encounters:  03/18/16 132/70  10/12/15 128/80  09/24/15 130/82

## 2016-03-23 NOTE — Assessment & Plan Note (Addendum)
Mild to mod, for depomedrol IM, predpac asd, for pain control tx,  to f/u any worsening symptoms or concerns

## 2016-04-08 ENCOUNTER — Other Ambulatory Visit: Payer: Self-pay | Admitting: Internal Medicine

## 2016-06-16 DIAGNOSIS — H43813 Vitreous degeneration, bilateral: Secondary | ICD-10-CM | POA: Diagnosis not present

## 2016-06-16 DIAGNOSIS — H40023 Open angle with borderline findings, high risk, bilateral: Secondary | ICD-10-CM | POA: Diagnosis not present

## 2016-06-16 DIAGNOSIS — H04123 Dry eye syndrome of bilateral lacrimal glands: Secondary | ICD-10-CM | POA: Diagnosis not present

## 2016-07-01 ENCOUNTER — Ambulatory Visit (INDEPENDENT_AMBULATORY_CARE_PROVIDER_SITE_OTHER): Payer: Commercial Managed Care - HMO

## 2016-07-01 DIAGNOSIS — Z23 Encounter for immunization: Secondary | ICD-10-CM

## 2016-07-14 DIAGNOSIS — H02834 Dermatochalasis of left upper eyelid: Secondary | ICD-10-CM | POA: Diagnosis not present

## 2016-07-14 DIAGNOSIS — D485 Neoplasm of uncertain behavior of skin: Secondary | ICD-10-CM | POA: Diagnosis not present

## 2016-07-14 DIAGNOSIS — H0279 Other degenerative disorders of eyelid and periocular area: Secondary | ICD-10-CM | POA: Diagnosis not present

## 2016-07-14 DIAGNOSIS — H02831 Dermatochalasis of right upper eyelid: Secondary | ICD-10-CM | POA: Diagnosis not present

## 2016-08-06 ENCOUNTER — Telehealth: Payer: Self-pay | Admitting: Internal Medicine

## 2016-08-06 NOTE — Telephone Encounter (Signed)
Pt called and would like to know what dignoi was given   Best [phone]

## 2016-08-28 ENCOUNTER — Encounter: Payer: Self-pay | Admitting: Gastroenterology

## 2016-09-01 ENCOUNTER — Other Ambulatory Visit: Payer: Self-pay | Admitting: Cardiovascular Disease

## 2016-09-18 NOTE — Progress Notes (Signed)
Chief Complaint  Patient presents with  . Follow-up     History of Present Illness: 76 yo male with history of CAD s/p 4V CABG in 1995, ischemic cardiomyopathy, HTN, HLD, COPD, CKD here today for cardiac follow up. He has CAD dating back to the 1990s with prior PCI and CABG. The last cath report I can find is scanned into EPIC from 1999 and shows failure of the LIMA graft to the LAD and the SVG to the RCA. There was high grade disease in the SVG to the OM and the SVG to the Diagonal. It appears that the LAD was treated with rotablator atherectomy and stenting in 1999. The SVG to the OM and the SVG to the Diagonal were treated with angioplasty. I saw him 07/28/13 and he had c/o exertional chest pains, resolved with rest. Stress myoview 08/17/13 Intermediate risk study with a fixed large, severe inferolateral, basal to mid inferior, and mid to apical anterior perfusion defect. This suggested prior infarction with no significant ischemia.   He is here today for follow up. No chest pain or SOB. He is very active. He works in the yard. No near syncope or syncope. No LE edema.   Primary Care Physician: Cathlean Cower, MD   Past Medical History:  Diagnosis Date  . Alcohol abuse    hx  . Anemia, iron deficiency   . AR (allergic rhinitis)   . BPH (benign prostatic hypertrophy)   . CAD (coronary artery disease) of artery bypass graft   . CHF (congestive heart failure) ([place])   . CKD (chronic kidney disease) 09/30/2011  . COPD (chronic obstructive pulmonary disease) ([place])   . Crohn's disease ([place])   . Dysuria   . ED (erectile dysfunction) of organic origin   . Fatigue   . Glaucoma   . Glucose intolerance (impaired glucose tolerance)   . HLD (hyperlipidemia)   . HTN (hypertension)   . Impaired glucose tolerance 09/27/2011  . MI (myocardial infarction)    hx  . Moderate or severe vision impairment, both eyes, impairment level not further specified   . Postherpetic neuralgia   . PSA elevation   .  Routine general medical examination at a health care facility   . Shingles   . Skin cancer    hx; non-melanoma  . Special screening for malignant neoplasm of prostate   . Urinary frequency     Past Surgical History:  Procedure Laterality Date  . CORONARY ANGIOPLASTY WITH STENT PLACEMENT    . CORONARY ARTERY BYPASS GRAFT     11+ years ago; has an EF of about 45%, last Myoview showed no ischemia 12/30/06  . [facility]  2011   Dr. Rush Farmer   . right cataract  12/11   with lens implant     Current Outpatient Prescriptions  Medication Sig Dispense Refill  . allopurinol (ZYLOPRIM) 100 MG tablet Take 1 tablet (100 mg total) by mouth daily. 90 tablet 3  . aspirin 81 MG EC tablet Take 81 mg by mouth daily.      Marland Kitchen atorvastatin (LIPITOR) 80 MG tablet Take 1 tablet (80 mg total) by mouth daily. 90 tablet 0  . benzonatate (TESSALON PERLES) 100 MG capsule Take 1 capsule (100 mg total) by mouth 2 (two) times daily as needed for cough. 60 capsule 3  . bismuth-metronidazole-tetracycline (PYLERA) 140-125-125 MG per capsule Take 3 capsules by mouth 4 (four) times daily -  before meals and at bedtime. 120 capsule 0  . carvedilol (COREG)  6.25 MG tablet TAKE 1 TABLET TWICE DAILY WITH MEAL 180 tablet 2  . Multiple Vitamins-Minerals (CENTRUM PO) Take 1 tablet by mouth daily.      . nitroGLYCERIN (NITROSTAT) 0.4 MG SL tablet Place 0.4 mg under the tongue every 5 (five) minutes as needed for chest pain (Call 911 at 3rd dose within 15 minutes).    Marland Kitchen omeprazole (PRILOSEC) 20 MG capsule Take 1 capsule (20 mg total) by mouth daily. 30 capsule 6  . predniSONE (DELTASONE) 10 MG tablet 3 tabs by mouth per day for 3 days,2tabs per day for 3 days,1tab per day for 3 days 18 tablet 0  . pregabalin (LYRICA) 50 MG capsule Take 50 mg by mouth at bedtime.      Marland Kitchen spironolactone (ALDACTONE) 25 MG tablet Take 1 tablet (25 mg total) by mouth daily. 90 tablet 0  . traMADol (ULTRAM) 50 MG tablet Take 1 tablet (50 mg total) by mouth every  6 (six) hours as needed (pain). 60 tablet 1   No current facility-administered medications for this visit.     No Known Allergies  Social History   Social History  . Marital status: Married    Spouse name: N/A  . Number of children: N/A  . Years of education: N/A   Occupational History  . Not on file.   Social History Main Topics  . Smoking status: Former Smoker    Quit date: 03/20/1981  . Smokeless tobacco: Never Used  . Alcohol use No  . Drug use: No  . Sexual activity: Not on file   Other Topics Concern  . Not on file   Social History Narrative   Married, 4 children; retired - Museum/gallery curator.      Designated party release on file. [employer]. 01/15/10.     Family History  Problem Relation Age of Onset  . Heart disease Mother     Review of Systems:  As stated in the HPI and otherwise negative.   BP (!) 148/82 (BP Location: Left Arm)   Pulse (!) 56   Ht 5\' 10"  (1.778 m)   Wt 162 lb (73.5 kg)   BMI 23.24 kg/m   Physical Examination: General: Well developed, well nourished, NAD  HEENT: OP clear, mucus membranes moist  SKIN: warm, dry. No rashes. Neuro: No focal deficits  Musculoskeletal: Muscle strength 5/5 all ext  Psychiatric: Mood and affect normal  Neck: No JVD, no carotid bruits, no thyromegaly, no lymphadenopathy.  Lungs:Clear bilaterally, no wheezes, rhonci, crackles Cardiovascular: Regular rate and rhythm. No murmurs, gallops or rubs. Abdomen:Soft. Bowel sounds present. Non-tender.  Extremities: No lower extremity edema. Pulses are 2 + in the bilateral DP/PT.  Echo 03/26/10: Left ventricle: The EF is in the 35-40% range. There is mild global hypokinesis with more marked hypokinesis of the inferior and posterior walls. - Right ventricle: The cavity size was mildly dilated. Systolic function was mildly reduced. - Right atrium: The atrium was mildly dilated.  Stress myoview 08/17/13: Stress Procedure: The patient received IV Lexiscan 0.4 mg  over 15-seconds with concurrent low level exercise and then Technetium 89m Sestamibi was injected at 30-seconds while the patient continued walking one more minute. Quantitative spect images were obtained after a 45-minute delay. During the infusion of Lexiscan, the patient complained of chest tightness. Symptom resolved in recovery.  Stress ECG: No significant change from baseline ECG  QPS  Raw Data Images: Normal; no motion artifact; normal heart/lung ratio.  Stress Images: Large, severe inferolateral, basal to  mid inferior, and mid to apical anterior perfusion defect.  Rest Images: Large, severe inferolateral, basal to mid inferior, and mid to apical anterior perfusion defect  Subtraction (SDS): Fixed large, severe inferolateral, basal to mid inferior, and mid to apical anterior perfusion defect  Transient Ischemic Dilatation (Normal <1.22): 0.88  Lung/Heart Ratio (Normal <0.45): 0.25  Quantitative Gated Spect Images  QGS EDV: 115 ml  QGS ESV: 55 ml  Impression  Exercise Capacity: Lexiscan with low level exercise.  BP Response: Normal blood pressure response.  Clinical Symptoms: Chest tightness.  ECG Impression: No significant ST segment change suggestive of ischemia.  Comparison with Prior Nuclear Study: No significant change from previous study  Overall Impression: Intermediate risk stress nuclear study with a fixed large, severe inferolateral, basal to mid inferior, and mid to apical anterior perfusion defect. This suggests prior infarction with no significant ischemia. Surprisingly, EF calculates to 53% (would consider echo to reassess EF which I would expect to be lower).  LV Ejection Fraction: 53%. LV Wall Motion: Peri-apical hypokinesis.   EKG:  EKG is ordered today. The ekg ordered today demonstrates Sinus brady, rate 56 bpm. Poor R wave progression precordial leads. T wave inversions lateral and anterolateral leads. Unchanged overall  Recent Labs: 10/12/2015: ALT 23; BUN 22;  Creatinine, Ser 1.57; Hemoglobin 13.9; Platelets 198.0; Potassium 5.5; Sodium 137; TSH 2.64   Lipid Panel    Component Value Date/Time   CHOL 141 10/12/2015 1058   TRIG 109.0 10/12/2015 1058   HDL 44.50 10/12/2015 1058   CHOLHDL 3 10/12/2015 1058   VLDL 21.8 10/12/2015 1058   LDLCALC 75 10/12/2015 1058     Wt Readings from Last 3 Encounters:  09/19/16 162 lb (73.5 kg)  03/18/16 164 lb (74.4 kg)  10/12/15 161 lb (73 kg)     Other studies Reviewed: Additional studies/ records that were reviewed today include: . Review of the above records demonstrates:    Assessment and Plan:   1. CAD s/p CABG without angina: He is s/p CABG. Stress myoview 08/17/13 with large scar unchanged from 2010, no ischemia. He is having no exertional chest pain. Continue ASA, statin and beta blocker.   2. HTN: BP slightly elevated today. He will follow at home. No changes.   3. Hyperlipidemia: He is on a statin. Lipids well controlled. No changes. He will have his complete bloodwork at f/u with Dr. Jenny Reichmann in several weeks.   4. Ischemic cardiomyopathy: Last LVEF=53% by stress [facility] December 2014. Continue medical therapy.   5. Chronic systolic CHF: Volume status is stable today. No changes.   Current medicines are reviewed at length with the patient today.  The patient does not have concerns regarding medicines.  The following changes have been made:  no change  Labs/ tests ordered today include:   Orders Placed This Encounter  Procedures  . EKG 12-Lead    Disposition:   FU with me in 12  months  Signed, Lauree Chandler, MD 09/19/2016 9:31 AM    [facility] [address], [place], [place]  [address] Phone: [phone]; Fax: [phone]

## 2016-09-19 ENCOUNTER — Encounter: Payer: Self-pay | Admitting: Cardiovascular Disease

## 2016-09-19 ENCOUNTER — Ambulatory Visit (INDEPENDENT_AMBULATORY_CARE_PROVIDER_SITE_OTHER): Payer: Medicare HMO | Admitting: Cardiovascular Disease

## 2016-09-19 VITALS — BP 148/82 | HR 56 | Ht 70.0 in | Wt 162.0 lb

## 2016-09-19 DIAGNOSIS — I10 Essential (primary) hypertension: Secondary | ICD-10-CM | POA: Diagnosis not present

## 2016-09-19 DIAGNOSIS — I2510 Atherosclerotic heart disease of native coronary artery without angina pectoris: Secondary | ICD-10-CM

## 2016-09-19 DIAGNOSIS — I5042 Chronic combined systolic (congestive) and diastolic (congestive) heart failure: Secondary | ICD-10-CM | POA: Diagnosis not present

## 2016-09-19 DIAGNOSIS — I255 Ischemic cardiomyopathy: Secondary | ICD-10-CM | POA: Diagnosis not present

## 2016-09-19 DIAGNOSIS — E7800 Pure hypercholesterolemia, unspecified: Secondary | ICD-10-CM

## 2016-09-19 NOTE — Patient Instructions (Signed)
Medication Instructions:  Your physician recommends that you continue on your current medications as directed. Please refer to the Current Medication list given to you today.   Labwork: none  Testing/Procedures: none  Follow-Up: Your physician recommends that you schedule a follow-up appointment in: 12 months. Please call us in about 9 months to schedule this appointment.     Any Other Special Instructions Will Be Listed Below (If Applicable).     If you need a refill on your cardiac medications before your next appointment, please call your pharmacy.

## 2016-10-14 ENCOUNTER — Other Ambulatory Visit (INDEPENDENT_AMBULATORY_CARE_PROVIDER_SITE_OTHER): Payer: Medicare HMO

## 2016-10-14 ENCOUNTER — Encounter: Payer: Self-pay | Admitting: Internal Medicine

## 2016-10-14 ENCOUNTER — Other Ambulatory Visit: Payer: Self-pay | Admitting: Internal Medicine

## 2016-10-14 ENCOUNTER — Ambulatory Visit (INDEPENDENT_AMBULATORY_CARE_PROVIDER_SITE_OTHER): Payer: Medicare HMO | Admitting: Internal Medicine

## 2016-10-14 VITALS — BP 138/80 | HR 56 | Temp 97.6°F | Ht 70.0 in | Wt 163.0 lb

## 2016-10-14 DIAGNOSIS — R7302 Impaired glucose tolerance (oral): Secondary | ICD-10-CM

## 2016-10-14 DIAGNOSIS — N183 Chronic kidney disease, stage 3 unspecified: Secondary | ICD-10-CM

## 2016-10-14 DIAGNOSIS — R972 Elevated prostate specific antigen [PSA]: Secondary | ICD-10-CM

## 2016-10-14 DIAGNOSIS — J309 Allergic rhinitis, unspecified: Secondary | ICD-10-CM | POA: Diagnosis not present

## 2016-10-14 DIAGNOSIS — I10 Essential (primary) hypertension: Secondary | ICD-10-CM | POA: Diagnosis not present

## 2016-10-14 DIAGNOSIS — Z0001 Encounter for general adult medical examination with abnormal findings: Secondary | ICD-10-CM | POA: Diagnosis not present

## 2016-10-14 LAB — BASIC METABOLIC PANEL
BUN: 18 mg/dL (ref 6–23)
CHLORIDE: 109 meq/L (ref 96–112)
CO2: 24 mEq/L (ref 19–32)
Calcium: 9.7 mg/dL (ref 8.4–10.5)
Creatinine, Ser: 1.47 mg/dL (ref 0.40–1.50)
GFR: 59.9 mL/min — ABNORMAL LOW (ref >60.00)
Glucose, Bld: 102 mg/dL — ABNORMAL HIGH (ref 70–99)
POTASSIUM: 4.8 meq/L (ref 3.5–5.1)
SODIUM: 145 meq/L (ref 135–145)

## 2016-10-14 LAB — CBC WITH DIFFERENTIAL/PLATELET
BASOS PCT: 0.3 % (ref 0.0–3.0)
Basophils Absolute: 0 10*3/uL (ref 0.0–0.1)
EOS ABS: 0.2 10*3/uL (ref 0.0–0.7)
EOS PCT: 4.3 % (ref 0.0–5.0)
HCT: 39.4 % (ref 39.0–52.0)
Hemoglobin: 12.8 g/dL — ABNORMAL LOW (ref 13.0–17.0)
LYMPHS ABS: 1.4 10*3/uL (ref 0.7–4.0)
Lymphocytes Relative: 26.6 % (ref 12.0–46.0)
MCHC: 32.5 g/dL (ref 30.0–36.0)
MCV: 86.5 fl (ref 78.0–100.0)
MONO ABS: 0.8 10*3/uL (ref 0.1–1.0)
Monocytes Relative: 15 % — ABNORMAL HIGH (ref 3.0–12.0)
NEUTROS ABS: 2.7 10*3/uL (ref 1.4–7.7)
Neutrophils Relative %: 53.8 % (ref 43.0–77.0)
PLATELETS: 193 10*3/uL (ref 150.0–400.0)
RBC: 4.56 Mil/uL (ref 4.22–5.81)
RDW: 14.5 % (ref 11.5–15.5)
WBC: 5.1 10*3/uL (ref 4.0–10.5)

## 2016-10-14 LAB — LIPID PANEL
Cholesterol: 137 mg/dL (ref 0–200)
HDL: 48.6 mg/dL (ref >39.00)
LDL CALC: 80 mg/dL (ref 0–99)
NONHDL: 88.12
TRIGLYCERIDES: 42 mg/dL (ref 0.0–149.0)
Total CHOL/HDL Ratio: 3
VLDL: 8.4 mg/dL (ref 0.0–40.0)

## 2016-10-14 LAB — URINALYSIS, ROUTINE W REFLEX MICROSCOPIC
BILIRUBIN URINE: NEGATIVE
Hgb urine dipstick: NEGATIVE
KETONES UR: NEGATIVE
Leukocytes, UA: NEGATIVE
Nitrite: NEGATIVE
PH: 6 (ref 5.0–8.0)
Specific Gravity, Urine: 1.01 (ref 1.000–1.030)
Total Protein, Urine: NEGATIVE
Urine Glucose: NEGATIVE
Urobilinogen, UA: 0.2 (ref 0.0–1.0)
WBC, UA: NONE SEEN — AB (ref 0–?)

## 2016-10-14 LAB — HEPATIC FUNCTION PANEL
ALT: 22 U/L (ref 0–53)
AST: 28 U/L (ref 0–37)
Albumin: 4.3 g/dL (ref 3.5–5.2)
Alkaline Phosphatase: 65 U/L (ref 39–117)
Bilirubin, Direct: 0.1 mg/dL (ref 0.0–0.3)
Total Bilirubin: 0.9 mg/dL (ref 0.2–1.2)
Total Protein: 7.7 g/dL (ref 6.0–8.3)

## 2016-10-14 LAB — HEMOGLOBIN A1C: Hgb A1c MFr Bld: 5.9 % (ref 4.6–6.5)

## 2016-10-14 LAB — PSA: PSA: 5.2 ng/mL — ABNORMAL HIGH (ref 0.10–4.00)

## 2016-10-14 LAB — TSH: TSH: 2.5 u[IU]/mL (ref 0.35–4.50)

## 2016-10-14 MED ORDER — CETIRIZINE HCL 10 MG PO TABS: 10.0000 mg | ORAL_TABLET | Freq: Every day | ORAL | 11 refills | Status: AC

## 2016-10-14 NOTE — Patient Instructions (Addendum)
OK to restart the zyrtec for allergies  Please continue all other medications as before, and refills have been done if requested.  Please have the pharmacy call with any other refills you may need.  Please continue your efforts at being more active, low cholesterol diet, and weight control.  You are otherwise up to date with prevention measures today.  Please keep your appointments with your specialists as you may have planned  Please go to the LAB in the Basement (turn left off the elevator) for the tests to be done today  You will be contacted by phone if any changes need to be made immediately.  Otherwise, you will receive a letter about your results with an explanation, but please check with MyChart first.  Please remember to sign up for MyChart if you have not done so, as this will be important to you in the future with finding out test results, communicating by private email, and scheduling acute appointments online when needed.  If you have Medicare related insurance (such as traditoinal Medicare, [employer] or [employer], or similar), Please make an appointment at the Scheduling desk with Sharee Pimple, the [employer], for your Wellness Visit in this office, which is a benefit with your insurance.  Please return in 6 months, or sooner if needed, with Lab testing done 3-5 days before., to keep closer eye on the kidney function

## 2016-10-14 NOTE — Progress Notes (Signed)
Subjective:    Patient ID: Gary Levy, male    DOB: Dec 06, 1940, 76 y.o.   MRN: [id number]  HPI  Here for wellness and f/u;  Overall doing ok;  Pt denies Chest pain, worsening SOB, DOE, wheezing, orthopnea, PND, worsening LE edema, palpitations, dizziness or syncope.  Pt denies neurological change such as new headache, facial or extremity weakness.  Pt denies polydipsia, polyuria, or low sugar symptoms. Pt states overall good compliance with treatment and medications, good tolerability, and has been trying to follow appropriate diet.  Pt denies worsening depressive symptoms, suicidal ideation or panic. No fever, night sweats, wt loss, loss of appetite, or other constitutional symptoms.  Pt states good ability with ADL's, has low fall risk, home safety reviewed and adequate, no other significant changes in hearing or vision,  Denies urinary symptoms such as dysuria, frequency, urgency, flank pain, hematuria or n/v, fever, chills. Does have several wks ongoing nasal allergy symptoms with clearish congestion, itch and sneezing, without fever, pain, ST, cough, swelling or wheezing.  Pt denies polydipsia, polyuria,   Pt states overall good compliance with meds, trying to follow lower cholesterol diet, wt overall stable but little exercise however.    Past Medical History:  Diagnosis Date  . Alcohol abuse    hx  . Anemia, iron deficiency   . AR (allergic rhinitis)   . BPH (benign prostatic hypertrophy)   . CAD (coronary artery disease) of artery bypass graft   . CHF (congestive heart failure) ([place])   . CKD (chronic kidney disease) 09/30/2011  . COPD (chronic obstructive pulmonary disease) ([place])   . Crohn's disease ([place])   . Dysuria   . ED (erectile dysfunction) of organic origin   . Fatigue   . Glaucoma   . Glucose intolerance (impaired glucose tolerance)   . HLD (hyperlipidemia)   . HTN (hypertension)   . Impaired glucose tolerance 09/27/2011  . MI (myocardial infarction)    hx  . Moderate  or severe vision impairment, both eyes, impairment level not further specified   . Postherpetic neuralgia   . PSA elevation   . Routine general medical examination at a health care facility   . Shingles   . Skin cancer    hx; non-melanoma  . Special screening for malignant neoplasm of prostate   . Urinary frequency    Past Surgical History:  Procedure Laterality Date  . CORONARY ANGIOPLASTY WITH STENT PLACEMENT    . CORONARY ARTERY BYPASS GRAFT     11+ years ago; has an EF of about 45%, last Myoview showed no ischemia 12/30/06  . [facility]  2011   Dr. Rush Farmer   . right cataract  12/11   with lens implant     reports that he quit smoking about 35 years ago. He has never used smokeless tobacco. He reports that he does not drink alcohol or use drugs. family history includes Heart disease in his mother. No Known Allergies Current Outpatient Prescriptions on File Prior to Visit  Medication Sig Dispense Refill  . allopurinol (ZYLOPRIM) 100 MG tablet Take 1 tablet (100 mg total) by mouth daily. 90 tablet 3  . aspirin 81 MG EC tablet Take 81 mg by mouth daily.      Marland Kitchen atorvastatin (LIPITOR) 80 MG tablet Take 1 tablet (80 mg total) by mouth daily. 90 tablet 0  . benzonatate (TESSALON PERLES) 100 MG capsule Take 1 capsule (100 mg total) by mouth 2 (two) times daily as needed for cough. 60 capsule  3  . bismuth-metronidazole-tetracycline (PYLERA) 140-125-125 MG per capsule Take 3 capsules by mouth 4 (four) times daily -  before meals and at bedtime. 120 capsule 0  . carvedilol (COREG) 6.25 MG tablet TAKE 1 TABLET TWICE DAILY WITH MEAL 180 tablet 2  . Multiple Vitamins-Minerals (CENTRUM PO) Take 1 tablet by mouth daily.      . nitroGLYCERIN (NITROSTAT) 0.4 MG SL tablet Place 0.4 mg under the tongue every 5 (five) minutes as needed for chest pain (Call 911 at 3rd dose within 15 minutes).    Marland Kitchen omeprazole (PRILOSEC) 20 MG capsule Take 1 capsule (20 mg total) by mouth daily. 30 capsule 6  . predniSONE  (DELTASONE) 10 MG tablet 3 tabs by mouth per day for 3 days,2tabs per day for 3 days,1tab per day for 3 days 18 tablet 0  . pregabalin (LYRICA) 50 MG capsule Take 50 mg by mouth at bedtime.      Marland Kitchen spironolactone (ALDACTONE) 25 MG tablet Take 1 tablet (25 mg total) by mouth daily. 90 tablet 0  . traMADol (ULTRAM) 50 MG tablet Take 1 tablet (50 mg total) by mouth every 6 (six) hours as needed (pain). 60 tablet 1   No current facility-administered medications on file prior to visit.    Review of Systems Constitutional: Negative for increased diaphoresis, or other activity, appetite or siginficant weight change other than noted HENT: Negative for worsening hearing loss, ear pain, facial swelling, mouth sores and neck stiffness.   Eyes: Negative for other worsening pain, redness or visual disturbance.  Respiratory: Negative for choking or stridor Cardiovascular: Negative for other chest pain and palpitations.  Gastrointestinal: Negative for worsening diarrhea, blood in stool, or abdominal distention Genitourinary: Negative for hematuria, flank pain or change in urine volume.  Musculoskeletal: Negative for myalgias or other joint complaints.  Skin: Negative for other color change and wound or drainage.  Neurological: Negative for syncope and numbness. other than noted Hematological: Negative for adenopathy. or other swelling Psychiatric/Behavioral: Negative for hallucinations, SI, self-injury, decreased concentration or other worsening agitation.  All other system neg per pt    Objective:   Physical Exam BP 138/80   Pulse (!) 56   Temp 97.6 F (36.4 C)   Ht 5\' 10"  (1.778 m)   Wt 163 lb (73.9 kg)   SpO2 97%   BMI 23.39 kg/m  VS noted,  Constitutional: Pt is oriented to person, place, and time. Appears well-developed and well-nourished, in no significant distress Head: Normocephalic and atraumatic  Eyes: Conjunctivae and EOM are normal. Pupils are equal, round, and reactive to  light Right Ear: External ear normal.  Left Ear: External ear normal Nose: Nose normal.  Bilat tm's with mild erythema.  Max sinus areas non tender.  Pharynx with mild erythema, no exudate Mouth/Throat: Oropharynx is clear and moist  Neck: Normal range of motion. Neck supple. No JVD present. No tracheal deviation present or significant neck LA or mass Cardiovascular: Normal rate, regular rhythm, normal heart sounds and intact distal pulses.   Pulmonary/Chest: Effort normal and breath sounds without rales or wheezing  Abdominal: Soft. Bowel sounds are normal. NT. No HSM  Musculoskeletal: Normal range of motion. Exhibits no edema Lymphadenopathy: Has no cervical adenopathy.  Neurological: Pt is alert and oriented to person, place, and time. Pt has normal reflexes. No cranial nerve deficit. Motor grossly intact Skin: Skin is warm and dry. No rash noted or new ulcers Psychiatric:  Has normal mood and affect. Behavior is normal.  No  other exam findings    Assessment & Plan:

## 2016-10-15 NOTE — Assessment & Plan Note (Signed)
Lab Results  Component Value Date   CREATININE 1.47 10/14/2016   stable overall by history and exam, recent data reviewed with pt, and pt to continue medical treatment as before,  to f/u any worsening symptoms or concerns

## 2016-10-15 NOTE — Assessment & Plan Note (Signed)
stable overall by history and exam, recent data reviewed with pt, and pt to continue medical treatment as before,  to f/u any worsening symptoms or concerns BP Readings from Last 3 Encounters:  10/14/16 138/80  09/19/16 (!) 148/82  03/18/16 132/70

## 2016-10-15 NOTE — Assessment & Plan Note (Signed)
Mild flare, to restart the zyrtec prn,  to f/u any worsening symptoms or concerns

## 2016-10-15 NOTE — Assessment & Plan Note (Signed)
stable overall by history and exam, recent data reviewed with pt, and pt to continue medical treatment as before,  to f/u any worsening symptoms or concerns Lab Results  Component Value Date   HGBA1C 5.9 10/14/2016

## 2016-10-15 NOTE — Assessment & Plan Note (Signed)

## 2016-10-15 NOTE — Assessment & Plan Note (Addendum)
Asympt, d/w pt at length, for psa f/u, has previously declined urology referral but may reconsider  In addition to the time spent performing CPE, I spent an additional 15 minutes face to face,in which greater than 50% of this time was spent in counseling and coordination of care for patient's illness as documented.

## 2016-11-04 ENCOUNTER — Other Ambulatory Visit: Payer: Self-pay | Admitting: Cardiovascular Disease

## 2016-11-17 DIAGNOSIS — R351 Nocturia: Secondary | ICD-10-CM | POA: Diagnosis not present

## 2016-11-17 DIAGNOSIS — R972 Elevated prostate specific antigen [PSA]: Secondary | ICD-10-CM | POA: Diagnosis not present

## 2016-11-17 DIAGNOSIS — N401 Enlarged prostate with lower urinary tract symptoms: Secondary | ICD-10-CM | POA: Diagnosis not present

## 2016-11-18 ENCOUNTER — Other Ambulatory Visit: Payer: Self-pay | Admitting: Internal Medicine

## 2016-12-15 DIAGNOSIS — N401 Enlarged prostate with lower urinary tract symptoms: Secondary | ICD-10-CM | POA: Diagnosis not present

## 2016-12-15 DIAGNOSIS — R351 Nocturia: Secondary | ICD-10-CM | POA: Diagnosis not present

## 2016-12-31 ENCOUNTER — Emergency Department (HOSPITAL_COMMUNITY)
Admission: EM | Admit: 2016-12-31 | Discharge: 2016-12-31 | Disposition: A | Discharge status: Home / Self Care | Payer: Medicare HMO | Attending: Emergency Medicine | Admitting: Emergency Medicine

## 2016-12-31 ENCOUNTER — Emergency Department (HOSPITAL_COMMUNITY): Payer: Medicare HMO

## 2016-12-31 ENCOUNTER — Encounter (HOSPITAL_COMMUNITY): Payer: Self-pay | Admitting: Radiology

## 2016-12-31 DIAGNOSIS — Z7982 Long term (current) use of aspirin: Secondary | ICD-10-CM | POA: Insufficient documentation

## 2016-12-31 DIAGNOSIS — R079 Chest pain, unspecified: Secondary | ICD-10-CM | POA: Diagnosis not present

## 2016-12-31 DIAGNOSIS — Z79899 Other long term (current) drug therapy: Secondary | ICD-10-CM | POA: Insufficient documentation

## 2016-12-31 DIAGNOSIS — N189 Chronic kidney disease, unspecified: Secondary | ICD-10-CM | POA: Insufficient documentation

## 2016-12-31 DIAGNOSIS — K2970 Gastritis, unspecified, without bleeding: Secondary | ICD-10-CM | POA: Diagnosis not present

## 2016-12-31 DIAGNOSIS — R1111 Vomiting without nausea: Secondary | ICD-10-CM | POA: Diagnosis not present

## 2016-12-31 DIAGNOSIS — R109 Unspecified abdominal pain: Secondary | ICD-10-CM | POA: Diagnosis not present

## 2016-12-31 DIAGNOSIS — Z87891 Personal history of nicotine dependence: Secondary | ICD-10-CM | POA: Diagnosis not present

## 2016-12-31 DIAGNOSIS — R531 Weakness: Secondary | ICD-10-CM | POA: Diagnosis not present

## 2016-12-31 DIAGNOSIS — R112 Nausea with vomiting, unspecified: Secondary | ICD-10-CM | POA: Diagnosis present

## 2016-12-31 DIAGNOSIS — J449 Chronic obstructive pulmonary disease, unspecified: Secondary | ICD-10-CM | POA: Diagnosis not present

## 2016-12-31 DIAGNOSIS — K8050 Calculus of bile duct without cholangitis or cholecystitis without obstruction: Secondary | ICD-10-CM | POA: Insufficient documentation

## 2016-12-31 DIAGNOSIS — R1110 Vomiting, unspecified: Secondary | ICD-10-CM | POA: Diagnosis not present

## 2016-12-31 DIAGNOSIS — I130 Hypertensive heart and chronic kidney disease with heart failure and stage 1 through stage 4 chronic kidney disease, or unspecified chronic kidney disease: Secondary | ICD-10-CM | POA: Insufficient documentation

## 2016-12-31 DIAGNOSIS — I509 Heart failure, unspecified: Secondary | ICD-10-CM | POA: Insufficient documentation

## 2016-12-31 LAB — CBC WITH DIFFERENTIAL/PLATELET
Basophils Absolute: 0 10*3/uL (ref 0.0–0.1)
Basophils Relative: 0 %
EOS PCT: 3 %
Eosinophils Absolute: 0.1 10*3/uL (ref 0.0–0.7)
HCT: 33.8 % — ABNORMAL LOW (ref 39.0–52.0)
HEMOGLOBIN: 11.1 g/dL — AB (ref 13.0–17.0)
LYMPHS ABS: 0.8 10*3/uL (ref 0.7–4.0)
Lymphocytes Relative: 18 %
MCH: 27.9 pg (ref 26.0–34.0)
MCHC: 32.8 g/dL (ref 30.0–36.0)
MCV: 84.9 fL (ref 78.0–100.0)
MONOS PCT: 12 %
Monocytes Absolute: 0.6 10*3/uL (ref 0.1–1.0)
Neutro Abs: 3 10*3/uL (ref 1.7–7.7)
Neutrophils Relative %: 67 %
Platelets: 150 10*3/uL (ref 150–400)
RBC: 3.98 MIL/uL — ABNORMAL LOW (ref 4.22–5.81)
RDW: 14.2 % (ref 11.5–15.5)
WBC: 4.5 10*3/uL (ref 4.0–10.5)

## 2016-12-31 LAB — COMPREHENSIVE METABOLIC PANEL
ALK PHOS: 53 U/L (ref 38–126)
ALT: 24 U/L (ref 17–63)
ANION GAP: 7 (ref 5–15)
AST: 33 U/L (ref 15–41)
Albumin: 3.4 g/dL — ABNORMAL LOW (ref 3.5–5.0)
BUN: 16 mg/dL (ref 6–20)
CALCIUM: 9 mg/dL (ref 8.9–10.3)
CO2: 23 mmol/L (ref 22–32)
Chloride: 108 mmol/L (ref 101–111)
Creatinine, Ser: 1.36 mg/dL — ABNORMAL HIGH (ref 0.61–1.24)
GFR calc Af Amer: 57 mL/min — ABNORMAL LOW (ref >60)
GFR calc non Af Amer: 49 mL/min — ABNORMAL LOW (ref >60)
Glucose, Bld: 172 mg/dL — ABNORMAL HIGH (ref 65–99)
Potassium: 4.3 mmol/L (ref 3.5–5.1)
SODIUM: 138 mmol/L (ref 135–145)
TOTAL PROTEIN: 6.7 g/dL (ref 6.5–8.1)
Total Bilirubin: 1.3 mg/dL — ABNORMAL HIGH (ref 0.3–1.2)

## 2016-12-31 LAB — CBG MONITORING, ED: Glucose-Capillary: 143 mg/dL — ABNORMAL HIGH (ref 65–99)

## 2016-12-31 LAB — URINALYSIS, ROUTINE W REFLEX MICROSCOPIC
BILIRUBIN URINE: NEGATIVE
GLUCOSE, UA: NEGATIVE mg/dL
HGB URINE DIPSTICK: NEGATIVE
KETONES UR: NEGATIVE mg/dL
Leukocytes, UA: NEGATIVE
Nitrite: NEGATIVE
PROTEIN: NEGATIVE mg/dL
Specific Gravity, Urine: 1.029 (ref 1.005–1.030)
pH: 5 (ref 5.0–8.0)

## 2016-12-31 LAB — I-STAT TROPONIN, ED
Troponin i, poc: 0 ng/mL (ref 0.00–0.08)
Troponin i, poc: 0 ng/mL (ref 0.00–0.08)

## 2016-12-31 LAB — LIPASE, BLOOD: LIPASE: 18 U/L (ref 11–51)

## 2016-12-31 MED ORDER — HYDROCODONE-ACETAMINOPHEN 5-325 MG PO TABS: 1.0000 | ORAL_TABLET | ORAL | 0 refills | Status: AC | PRN

## 2016-12-31 MED ORDER — SODIUM CHLORIDE 0.9 % IV BOLUS (SEPSIS)
1000.0000 mL | Freq: Once | INTRAVENOUS | Status: AC
  Administered 2016-12-31: 1000 mL via INTRAVENOUS

## 2016-12-31 MED ORDER — IOPAMIDOL (ISOVUE-300) INJECTION 61%
INTRAVENOUS | Status: AC
  Administered 2016-12-31: 100 mL
  Filled 2016-12-31: qty 100

## 2016-12-31 MED ORDER — MORPHINE SULFATE (PF) 4 MG/ML IV SOLN
4.0000 mg | Freq: Once | INTRAVENOUS | Status: AC
  Administered 2016-12-31: 4 mg via INTRAVENOUS
  Filled 2016-12-31: qty 1

## 2016-12-31 MED ORDER — ONDANSETRON HCL 4 MG/2ML IJ SOLN
4.0000 mg | Freq: Once | INTRAMUSCULAR | Status: AC
  Administered 2016-12-31: 4 mg via INTRAVENOUS
  Filled 2016-12-31: qty 2

## 2016-12-31 MED ORDER — ONDANSETRON 4 MG PO TBDP: ORAL_TABLET | ORAL | 0 refills | Status: DC

## 2016-12-31 NOTE — ED Notes (Signed)
Patient transported to CT 

## 2016-12-31 NOTE — Discharge Instructions (Signed)
Take motrin for pain.   Take vicodin for severe pain.   Take zofran as needed for nausea.   Stay hydrated.   Call surgery for appointment for gallstones.   Return to ER if you have worse abdominal pain, chest pain, fever, vomiting.

## 2016-12-31 NOTE — ED Provider Notes (Signed)
[facility] DEPT Provider Note   CSN: [id number] Arrival date & time: 12/31/16  0911     History   Chief Complaint Chief Complaint  Patient presents with  . Emesis  . Weakness    HPI Yoshiharu Brassell is a 76 y.o. male hx of COPD, CHF, CKD, CABG, here with epigastric pain, nausea, diaphoresis. Patient states that last night his right big toe was hurting so he took medicine for his gout (? Ultram). He woke up around 4 AM had severe epigastric pain and chest pain associated with diaphoresis. He felt nauseated but did not throw up today. The epigastric pain is constant and nonradiating. Also felt weak all over.   The history is provided by the patient.    Past Medical History:  Diagnosis Date  . Alcohol abuse    hx  . Anemia, iron deficiency   . AR (allergic rhinitis)   . BPH (benign prostatic hypertrophy)   . CAD (coronary artery disease) of artery bypass graft   . CHF (congestive heart failure) ([place])   . CKD (chronic kidney disease) 09/30/2011  . COPD (chronic obstructive pulmonary disease) ([place])   . Crohn's disease ([place])   . Dysuria   . ED (erectile dysfunction) of organic origin   . Fatigue   . Glaucoma   . Glucose intolerance (impaired glucose tolerance)   . HLD (hyperlipidemia)   . HTN (hypertension)   . Impaired glucose tolerance 09/27/2011  . MI (myocardial infarction) ([place])    hx  . Moderate or severe vision impairment, both eyes, impairment level not further specified   . Postherpetic neuralgia   . PSA elevation   . Routine general medical examination at a health care facility   . Shingles   . Skin cancer    hx; non-melanoma  . Special screening for malignant neoplasm of prostate   . Urinary frequency     Patient Active Problem List   Diagnosis Date Noted  . Bradycardia 10/12/2014  . Acute renal insufficiency 04/19/2014  . Acute gouty arthritis 10/19/2013  . Iron deficiency anemia 10/19/2013  . Cough 04/08/2013  . Hand pain, left 03/30/2012  .  Gynecomastia, male 03/23/2012  . Anemia, iron deficiency 09/30/2011  . CKD (chronic kidney disease) 09/30/2011  . Impaired glucose tolerance 09/27/2011  . Encounter for well adult exam with abnormal findings 09/27/2011  . INGUINAL HERNIA, LEFT 06/11/2010  . CALF PAIN, RIGHT 06/11/2010  . Edema 01/15/2010  . CAD, ARTERY BYPASS GRAFT 08/16/2009  . ERECTILE DYSFUNCTION, ORGANIC 08/15/2009  . GLAUCOMA 04/25/2009  . VISION IMPAIR BOTH EYES IMPAIR LEVEL NFS 12/13/2008  . POSTHERPETIC NEURALGIA 11/13/2008  . SHINGLES 10/27/2008  . Dysuria 10/27/2008  . HYPERLIPIDEMIA 04/05/2008  . Essential hypertension 04/05/2008  . Chronic combined systolic and diastolic congestive heart failure ([place]) 04/05/2008  . Allergic rhinitis 04/05/2008  . COPD 04/05/2008  . FATIGUE 04/05/2008  . FREQUENCY, URINARY 04/05/2008  . PSA, INCREASED 04/05/2008  . MYOCARDIAL INFARCTION, HX OF 04/05/2007  . BENIGN PROSTATIC HYPERTROPHY 04/05/2007  . SKIN CANCER, HX OF 04/05/2007  . ALCOHOL ABUSE, HX OF 04/05/2007    Past Surgical History:  Procedure Laterality Date  . CORONARY ANGIOPLASTY WITH STENT PLACEMENT    . CORONARY ARTERY BYPASS GRAFT     11+ years ago; has an EF of about 45%, last Myoview showed no ischemia 12/30/06  . [facility]  2011   Dr. Rush Farmer   . right cataract  12/11   with lens implant  Home Medications    Prior to Admission medications   Medication Sig Start Date End Date Taking? Authorizing Provider  allopurinol (ZYLOPRIM) 100 MG tablet Take 1 tablet (100 mg total) by mouth daily. 03/18/16   Biagio Borg, MD  aspirin 81 MG EC tablet Take 81 mg by mouth daily.      [provider]  atorvastatin (LIPITOR) 80 MG tablet TAKE 1 TABLET EVERY DAY 11/05/16   Burnell Blanks, MD  benzonatate (TESSALON PERLES) 100 MG capsule Take 1 capsule (100 mg total) by mouth 2 (two) times daily as needed for cough. 10/12/15   Biagio Borg, MD  bismuth-metronidazole-tetracycline [facility])  [phone] MG per capsule Take 3 capsules by mouth 4 (four) times daily -  before meals and at bedtime. 02/22/14   Lafayette Dragon, MD  carvedilol (COREG) 6.25 MG tablet TAKE 1 TABLET TWICE DAILY WITH MEAL 11/18/16   Biagio Borg, MD  cetirizine (ZYRTEC) 10 MG tablet Take 1 tablet (10 mg total) by mouth daily. 10/14/16 10/14/17  Biagio Borg, MD  Multiple Vitamins-Minerals (CENTRUM PO) Take 1 tablet by mouth daily.      [provider]  nitroGLYCERIN (NITROSTAT) 0.4 MG SL tablet Place 0.4 mg under the tongue every 5 (five) minutes as needed for chest pain (Call 911 at 3rd dose within 15 minutes).    [provider]  omeprazole (PRILOSEC) 20 MG capsule Take 1 capsule (20 mg total) by mouth daily. 09/28/14   Lafayette Dragon, MD  predniSONE (DELTASONE) 10 MG tablet 3 tabs by mouth per day for 3 days,2tabs per day for 3 days,1tab per day for 3 days 03/18/16   Biagio Borg, MD  pregabalin (LYRICA) 50 MG capsule Take 50 mg by mouth at bedtime.      [provider]  spironolactone (ALDACTONE) 25 MG tablet TAKE 1 TABLET (25 MG TOTAL) BY MOUTH DAILY. 11/05/16   Burnell Blanks, MD  traMADol (ULTRAM) 50 MG tablet Take 1 tablet (50 mg total) by mouth every 6 (six) hours as needed (pain). 03/18/16   Biagio Borg, MD    Family History Family History  Problem Relation Age of Onset  . Heart disease Mother     Social History Social History  Substance Use Topics  . Smoking status: Former Smoker    Quit date: 03/20/1981  . Smokeless tobacco: Never Used  . Alcohol use No     Allergies   Patient has no known allergies.   Review of Systems Review of Systems  Gastrointestinal: Positive for abdominal pain and nausea.  Neurological: Positive for weakness.  All other systems reviewed and are negative.    Physical Exam Updated Vital Signs BP 135/63   Pulse (!) 48   Resp 16   SpO2 95%   Physical Exam  Constitutional: He is oriented to person, place, and time.    Uncomfortable   HENT:  Head: Normocephalic.  MM slightly dry   Eyes: EOM are normal. Pupils are equal, round, and reactive to light.  Neck: Normal range of motion. Neck supple.  Cardiovascular: Normal rate, regular rhythm and normal heart sounds.   Pulmonary/Chest: Effort normal and breath sounds normal. No respiratory distress. He has no wheezes. He has no rales.  Abdominal: Soft. Bowel sounds are normal.  + epigastric tenderness, mild periumbilical tenderness as well   Musculoskeletal: Normal range of motion. He exhibits no edema.  Neurological: He is alert and oriented to person, place, and time. No  cranial nerve deficit. Coordination normal.  Skin: Skin is warm.  Psychiatric: He has a normal mood and affect.  Nursing note and vitals reviewed.    ED Treatments / Results  Labs (all labs ordered are listed, but only abnormal results are displayed) Labs Reviewed  CBC WITH DIFFERENTIAL/PLATELET - Abnormal; Notable for the following:       Result Value   RBC 3.98 (*)    Hemoglobin 11.1 (*)    HCT 33.8 (*)    All other components within normal limits  COMPREHENSIVE METABOLIC PANEL - Abnormal; Notable for the following:    Glucose, Bld 172 (*)    Creatinine, Ser 1.36 (*)    Albumin 3.4 (*)    Total Bilirubin 1.3 (*)    GFR calc non Af Amer 49 (*)    GFR calc Af Amer 57 (*)    All other components within normal limits  CBG MONITORING, ED - Abnormal; Notable for the following:    Glucose-Capillary 143 (*)    All other components within normal limits  LIPASE, BLOOD  URINALYSIS, ROUTINE W REFLEX MICROSCOPIC  I-STAT TROPOININ, ED  I-STAT TROPOININ, ED    EKG  EKG Interpretation  Date/Time:  Wednesday Dec 31 2016 09:17:31 EDT Ventricular Rate:  45 PR Interval:    QRS Duration: 115 QT Interval:  513 QTC Calculation: 444 R Axis:   71 Text Interpretation:  Sinus bradycardia Consider left atrial enlargement Nonspecific intraventricular conduction delay Anterolateral  infarct, age indeterminate No previous ECGs available Confirmed by [place]  MD, Bryn Saline ([id number]) on 12/31/2016 10:33:47 AM       Radiology Dg Chest 2 View  Result Date: 12/31/2016 CLINICAL DATA:  Chest and abdominal pain EXAM: CHEST  2 VIEW COMPARISON:  04/08/2013 FINDINGS: Cardiac shadow is within normal limits. Postsurgical changes are again seen. The lungs are clear bilaterally. No focal infiltrate or sizable effusion is noted. No bony abnormality is seen. IMPRESSION: No active cardiopulmonary disease. Electronically Signed   By: Inez Catalina M.D.   On: 12/31/2016 10:21   Ct Abdomen Pelvis W Contrast  Result Date: 12/31/2016 CLINICAL DATA:  Upper abdominal pain since this morning. Nausea without vomiting. EXAM: CT ABDOMEN AND PELVIS WITH CONTRAST TECHNIQUE: Multidetector CT imaging of the abdomen and pelvis was performed using the standard protocol following bolus administration of intravenous contrast. CONTRAST:  118mL ISOVUE-300 IOPAMIDOL (ISOVUE-300) INJECTION 61% COMPARISON:  None. FINDINGS: Lower chest: Evaluation is limited by breathing motion artifact. Bullous changes are noted in the right middle lobe. No obvious pulmonary lesions or acute pulmonary findings. No pleural effusion. The heart is normal in size. No pericardial effusion. Coronary artery calcifications are noted. Surgical changes from bypass surgery. Hepatobiliary: No focal hepatic lesions or intrahepatic biliary dilatation. Gallstones are noted in the gallbladder. No CT findings to suggest acute cholecystitis. No common bile duct dilatation. Pancreas: No mass, inflammation or ductal dilatation. Spleen: Normal size.  No focal lesions. Adrenals/Urinary Tract: The adrenal glands and kidneys are unremarkable. No renal, ureteral or bladder calculi or mass. Stomach/Bowel: The stomach, duodenum, small bowel and colon are unremarkable. No inflammatory changes, mass lesions or obstructive findings. The terminal ileum is normal. The appendix is  normal. Vascular/Lymphatic: Moderate atherosclerotic calcifications involving aorta and branch vessels. No aneurysm or dissection. The major venous structures are patent. No mesenteric or retroperitoneal mass or adenopathy. Reproductive: Enlarged prostate gland with median lobe hypertrophy impressing on the base of the bladder. Other: No pelvic mass or adenopathy. No free pelvic fluid collections. No  inguinal mass or adenopathy. No abdominal wall hernia or subcutaneous lesions. Musculoskeletal: Moderate degenerative changes involving the lower lumbar spine. No acute bony abnormalities. IMPRESSION: 1. No acute abdominal/pelvic findings, mass lesions or lymphadenopathy. 2. Cholelithiasis but no CT findings for acute cholecystitis. 3. Moderate atherosclerotic calcifications involving the aorta and iliac arteries and branch vessels. No aneurysm or dissection. 4. Enlarged prostate gland with median lobe hypertrophy impressing on the base the bladder but no findings for bladder outlet obstruction. Electronically Signed   By: Marijo Sanes M.D.   On: 12/31/2016 13:37    Procedures Procedures (including critical care time)  Medications Ordered in ED Medications  sodium chloride 0.9 % bolus 1,000 mL (0 mLs Intravenous Stopped 12/31/16 1225)  ondansetron (ZOFRAN) injection 4 mg (4 mg Intravenous Given 12/31/16 0944)  morphine 4 MG/ML injection 4 mg (4 mg Intravenous Given 12/31/16 0949)  iopamidol (ISOVUE-300) 61 % injection (100 mLs  Contrast Given 12/31/16 1307)     Initial Impression / Assessment and Plan / ED Course  I have reviewed the triage vital signs and the nursing notes.  Pertinent labs & imaging results that were available during my care of the patient were reviewed by me and considered in my medical decision making (see chart for details).     Chukwuemeka Artola is a 76 y.o. male here with epigastric pain, nausea, diaphoresis. Consider biliary colic vs pancreatitis vs inferior MI. Will get labs,  lipase, EKG, trop, CT ab/pel. Will give pain meds, IVF and reassess.   3:23 PM CT showed gallstones. WBC nl. AST, ALT normal. UA nl. Trop neg x 2. Pain controlled. I suspect biliary colic causing symptoms. No vomiting in the ED. HR upper 40s, chronic. Will dc home surgery follow up. If he needs cholecystectomy, will likely need cardiology clearance given cardize hx.    Final Clinical Impressions(s) / ED Diagnoses   Final diagnoses:  None    New Prescriptions New Prescriptions   No medications on file     Drenda Freeze, MD 12/31/16 1525

## 2016-12-31 NOTE — ED Notes (Signed)
Pt still unable to urinate. Urinal at bedside.  Pt aware that urine specimen needed.

## 2016-12-31 NOTE — ED Triage Notes (Signed)
Pt arrives via EMS from home with N/V/ WEAKNESS that began this morning. Per EMS pt with bradycardia, hr 40S,  upper abdominal/chest pain and diaphoresis. EKG for EMS elevation in V2, V3, sinus arrhythmia. Good pulses, lungs CTA. 100% RA. 147/62.

## 2017-01-02 ENCOUNTER — Telehealth: Payer: Self-pay | Admitting: Internal Medicine

## 2017-01-02 MED ORDER — COLCHICINE 0.6 MG PO TABS: 0.6000 mg | ORAL_TABLET | Freq: Every day | ORAL | 1 refills | Status: DC

## 2017-01-02 NOTE — Telephone Encounter (Signed)
Ok to add colchicien 1 qd  - done erx  Ok to let pt know he could be seen at Sat clinic in the AM if he wants to do this

## 2017-01-02 NOTE — Telephone Encounter (Signed)
Patient's wife called in stating that he has a gout flare up and current medication is not working.  States patient can not come in for appt.  Wants to know if meds can be changed?

## 2017-01-02 NOTE — Telephone Encounter (Signed)
Pt notified, and states he did not want to be seen right now.

## 2017-01-02 NOTE — Telephone Encounter (Signed)
Please advise 

## 2017-04-09 ENCOUNTER — Other Ambulatory Visit (INDEPENDENT_AMBULATORY_CARE_PROVIDER_SITE_OTHER): Payer: Medicare HMO

## 2017-04-09 DIAGNOSIS — I10 Essential (primary) hypertension: Secondary | ICD-10-CM | POA: Diagnosis not present

## 2017-04-09 DIAGNOSIS — R7302 Impaired glucose tolerance (oral): Secondary | ICD-10-CM | POA: Diagnosis not present

## 2017-04-09 LAB — HEPATIC FUNCTION PANEL
ALBUMIN: 4.1 g/dL (ref 3.5–5.2)
ALK PHOS: 65 U/L (ref 39–117)
ALT: 19 U/L (ref 0–53)
AST: 25 U/L (ref 0–37)
BILIRUBIN DIRECT: 0.3 mg/dL (ref 0.0–0.3)
BILIRUBIN TOTAL: 1.3 mg/dL — AB (ref 0.2–1.2)
TOTAL PROTEIN: 7.6 g/dL (ref 6.0–8.3)

## 2017-04-09 LAB — BASIC METABOLIC PANEL
BUN: 15 mg/dL (ref 6–23)
CO2: 29 mEq/L (ref 19–32)
Calcium: 10 mg/dL (ref 8.4–10.5)
Chloride: 102 mEq/L (ref 96–112)
Creatinine, Ser: 1.53 mg/dL — ABNORMAL HIGH (ref 0.40–1.50)
GFR: 57.12 mL/min — AB (ref >60.00)
Glucose, Bld: 95 mg/dL (ref 70–99)
Potassium: 4.2 mEq/L (ref 3.5–5.1)
Sodium: 137 mEq/L (ref 135–145)

## 2017-04-09 LAB — LIPID PANEL
CHOLESTEROL: 112 mg/dL (ref 0–200)
HDL: 38 mg/dL — AB (ref >39.00)
LDL Cholesterol: 61 mg/dL (ref 0–99)
NONHDL: 74.22
Total CHOL/HDL Ratio: 3
Triglycerides: 65 mg/dL (ref 0.0–149.0)
VLDL: 13 mg/dL (ref 0.0–40.0)

## 2017-04-09 LAB — HEMOGLOBIN A1C: HEMOGLOBIN A1C: 5.5 % (ref 4.6–6.5)

## 2017-04-14 ENCOUNTER — Encounter: Payer: Self-pay | Admitting: Internal Medicine

## 2017-04-14 ENCOUNTER — Ambulatory Visit (INDEPENDENT_AMBULATORY_CARE_PROVIDER_SITE_OTHER): Payer: Medicare HMO | Admitting: Internal Medicine

## 2017-04-14 VITALS — BP 126/76 | HR 65 | Temp 97.6°F | Ht 70.0 in | Wt 167.0 lb

## 2017-04-14 DIAGNOSIS — N183 Chronic kidney disease, stage 3 unspecified: Secondary | ICD-10-CM

## 2017-04-14 DIAGNOSIS — E785 Hyperlipidemia, unspecified: Secondary | ICD-10-CM | POA: Diagnosis not present

## 2017-04-14 DIAGNOSIS — R7302 Impaired glucose tolerance (oral): Secondary | ICD-10-CM | POA: Diagnosis not present

## 2017-04-14 DIAGNOSIS — Z23 Encounter for immunization: Secondary | ICD-10-CM

## 2017-04-14 DIAGNOSIS — I10 Essential (primary) hypertension: Secondary | ICD-10-CM | POA: Diagnosis not present

## 2017-04-14 DIAGNOSIS — Z0000 Encounter for general adult medical examination without abnormal findings: Secondary | ICD-10-CM | POA: Diagnosis not present

## 2017-04-14 MED ORDER — ALLOPURINOL 100 MG PO TABS: 100.0000 mg | ORAL_TABLET | Freq: Every day | ORAL | 3 refills | Status: DC

## 2017-04-14 MED ORDER — COLCHICINE 0.6 MG PO TABS: 0.6000 mg | ORAL_TABLET | Freq: Every day | ORAL | 1 refills | Status: DC

## 2017-04-14 NOTE — Patient Instructions (Addendum)
You had the flu shot today  Please continue all other medications as before, and refills have been done if requested.  Please have the pharmacy call with any other refills you may need.  Please continue your efforts at being more active, low cholesterol diet, and weight control.  You are otherwise up to date with prevention measures today.  Please keep your appointments with your specialists as you may have planned  Please return in 6 months, or sooner if needed, with Lab testing done 3-5 days before  

## 2017-04-14 NOTE — Progress Notes (Signed)
Subjective:    Patient ID: Gary Levy, male    DOB: [birth year], 76 y.o.   MRN: [id number]  HPI    Here to f/u; overall doing ok,  Pt denies chest pain, increasing sob or doe, wheezing, orthopnea, PND, increased LE swelling, palpitations, dizziness or syncope.  Pt denies new neurological symptoms such as new headache, or facial or extremity weakness or numbness.  Pt denies polydipsia, polyuria, or low sugar episode.  Pt states overall good compliance with meds, mostly trying to follow appropriate diet, with wt overall stable,  but no regular exercise however.  No new complaints Past Medical History:  Diagnosis Date  . Alcohol abuse    hx  . Anemia, iron deficiency   . AR (allergic rhinitis)   . BPH (benign prostatic hypertrophy)   . CAD (coronary artery disease) of artery bypass graft   . CHF (congestive heart failure) ([place])   . CKD (chronic kidney disease) 09/30/2011  . COPD (chronic obstructive pulmonary disease) ([place])   . Crohn's disease ([place])   . Dysuria   . ED (erectile dysfunction) of organic origin   . Fatigue   . Glaucoma   . Glucose intolerance (impaired glucose tolerance)   . HLD (hyperlipidemia)   . HTN (hypertension)   . Impaired glucose tolerance 09/27/2011  . MI (myocardial infarction) ([place])    hx  . Moderate or severe vision impairment, both eyes, impairment level not further specified   . Postherpetic neuralgia   . PSA elevation   . Routine general medical examination at a health care facility   . Shingles   . Skin cancer    hx; non-melanoma  . Special screening for malignant neoplasm of prostate   . Urinary frequency    Past Surgical History:  Procedure Laterality Date  . CORONARY ANGIOPLASTY WITH STENT PLACEMENT    . CORONARY ARTERY BYPASS GRAFT     11+ years ago; has an EF of about 45%, last Myoview showed no ischemia 12/30/06  . [facility]  2011   Dr. Rush Farmer   . right cataract  12/11   with lens implant     reports that he quit smoking about 36 years  ago. He has never used smokeless tobacco. He reports that he does not drink alcohol or use drugs. family history includes Heart disease in his mother. No Known Allergies Current Outpatient Prescriptions on File Prior to Visit  Medication Sig Dispense Refill  . aspirin 81 MG EC tablet Take 81 mg by mouth daily.      Marland Kitchen atorvastatin (LIPITOR) 80 MG tablet TAKE 1 TABLET EVERY DAY 90 tablet 3  . bismuth-metronidazole-tetracycline (PYLERA) 140-125-125 MG per capsule Take 3 capsules by mouth 4 (four) times daily -  before meals and at bedtime. 120 capsule 0  . carvedilol (COREG) 6.25 MG tablet TAKE 1 TABLET TWICE DAILY WITH MEAL 180 tablet 3  . cetirizine (ZYRTEC) 10 MG tablet Take 1 tablet (10 mg total) by mouth daily. 30 tablet 11  . HYDROcodone-acetaminophen (NORCO/VICODIN) 5-325 MG tablet Take 1-2 tablets by mouth every 4 (four) hours as needed. 10 tablet 0  . Multiple Vitamins-Minerals (CENTRUM PO) Take 1 tablet by mouth daily.      . nitroGLYCERIN (NITROSTAT) 0.4 MG SL tablet Place 0.4 mg under the tongue every 5 (five) minutes as needed for chest pain (Call 911 at 3rd dose within 15 minutes).    Marland Kitchen spironolactone (ALDACTONE) 25 MG tablet TAKE 1 TABLET (25 MG TOTAL) BY MOUTH DAILY. [place]  tablet 3   No current facility-administered medications on file prior to visit.    Review of Systems  Constitutional: Negative for other unusual diaphoresis or sweats HENT: Negative for ear discharge or swelling Eyes: Negative for other worsening visual disturbances Respiratory: Negative for stridor or other swelling  Gastrointestinal: Negative for worsening distension or other blood Genitourinary: Negative for retention or other urinary change Musculoskeletal: Negative for other MSK pain or swelling Skin: Negative for color change or other new lesions Neurological: Negative for worsening tremors and other numbness  Psychiatric/Behavioral: Negative for worsening agitation or other fatigue All other system neg  per pt    Objective:   Physical Exam BP 126/76   Pulse 65   Temp 97.6 F (36.4 C)   Ht 5\' 10"  (1.778 m)   Wt 167 lb (75.8 kg)   SpO2 98%   BMI 23.96 kg/m  VS noted,  Constitutional: Pt appears in NAD HENT: Head: NCAT.  Right Ear: External ear normal.  Left Ear: External ear normal.  Eyes: . Pupils are equal, round, and reactive to light. Conjunctivae and EOM are normal Nose: without d/c or deformity Neck: Neck supple. Gross normal ROM Cardiovascular: Normal rate and regular rhythm.   Pulmonary/Chest: Effort normal and breath sounds without rales or wheezing.  Abd:  Soft, NT, ND, + BS, no organomegaly Neurological: Pt is alert. At baseline orientation, motor grossly intact Skin: Skin is warm. No rashes, other new lesions, no LE edema Psychiatric: Pt behavior is normal without agitation  No other exam findings Lab Results  Component Value Date   WBC 4.5 12/31/2016   HGB 11.1 (L) 12/31/2016   HCT 33.8 (L) 12/31/2016   PLT 150 12/31/2016   GLUCOSE 95 04/09/2017   CHOL 112 04/09/2017   TRIG 65.0 04/09/2017   HDL 38.00 (L) 04/09/2017   LDLCALC 61 04/09/2017   ALT 19 04/09/2017   AST 25 04/09/2017   NA 137 04/09/2017   K 4.2 04/09/2017   CL 102 04/09/2017   CREATININE 1.53 (H) 04/09/2017   BUN 15 04/09/2017   CO2 29 04/09/2017   TSH 2.50 10/14/2016   PSA 5.20 (H) 10/14/2016   HGBA1C 5.5 04/09/2017   MICROALBUR 0.4 03/24/2012      Assessment & Plan:

## 2017-04-15 NOTE — Assessment & Plan Note (Signed)
stable overall by history and exam, recent data reviewed with pt, and pt to continue medical treatment as before,  to f/u any worsening symptoms or concerns Lab Results  Component Value Date   LDLCALC 61 04/09/2017

## 2017-04-15 NOTE — Assessment & Plan Note (Signed)
stable overall by history and exam, recent data reviewed with pt, and pt to continue medical treatment as before,  to f/u any worsening symptoms or concerns BP Readings from Last 3 Encounters:  04/14/17 126/76  12/31/16 139/67  10/14/16 138/80

## 2017-04-15 NOTE — Assessment & Plan Note (Signed)
stable overall by history and exam, recent data reviewed with pt, and pt to continue medical treatment as before,  to f/u any worsening symptoms or concerns Lab Results  Component Value Date   HGBA1C 5.5 04/09/2017

## 2017-04-15 NOTE — Assessment & Plan Note (Signed)
stable overall by history and exam, recent data reviewed with pt, and pt to continue medical treatment as before,  to f/u any worsening symptoms or concerns Lab Results  Component Value Date   CREATININE 1.53 (H) 04/09/2017

## 2017-06-29 DIAGNOSIS — H40023 Open angle with borderline findings, high risk, bilateral: Secondary | ICD-10-CM | POA: Diagnosis not present

## 2017-06-29 DIAGNOSIS — H04123 Dry eye syndrome of bilateral lacrimal glands: Secondary | ICD-10-CM | POA: Diagnosis not present

## 2017-06-29 DIAGNOSIS — H43813 Vitreous degeneration, bilateral: Secondary | ICD-10-CM | POA: Diagnosis not present

## 2017-08-24 ENCOUNTER — Other Ambulatory Visit: Payer: Self-pay | Admitting: Cardiovascular Disease

## 2017-09-08 ENCOUNTER — Other Ambulatory Visit: Payer: Self-pay | Admitting: Internal Medicine

## 2017-09-16 ENCOUNTER — Other Ambulatory Visit: Payer: Self-pay | Admitting: Cardiovascular Disease

## 2017-10-15 ENCOUNTER — Encounter: Payer: Self-pay | Admitting: Internal Medicine

## 2017-10-15 ENCOUNTER — Ambulatory Visit (INDEPENDENT_AMBULATORY_CARE_PROVIDER_SITE_OTHER): Payer: Medicare HMO | Admitting: Internal Medicine

## 2017-10-15 ENCOUNTER — Other Ambulatory Visit (INDEPENDENT_AMBULATORY_CARE_PROVIDER_SITE_OTHER): Payer: Medicare HMO

## 2017-10-15 VITALS — BP 122/76 | HR 74 | Temp 98.0°F | Ht 70.0 in | Wt 167.0 lb

## 2017-10-15 DIAGNOSIS — R7302 Impaired glucose tolerance (oral): Secondary | ICD-10-CM

## 2017-10-15 DIAGNOSIS — Z0000 Encounter for general adult medical examination without abnormal findings: Secondary | ICD-10-CM | POA: Diagnosis not present

## 2017-10-15 DIAGNOSIS — R350 Frequency of micturition: Secondary | ICD-10-CM | POA: Diagnosis not present

## 2017-10-15 DIAGNOSIS — Z0001 Encounter for general adult medical examination with abnormal findings: Secondary | ICD-10-CM

## 2017-10-15 DIAGNOSIS — N400 Enlarged prostate without lower urinary tract symptoms: Secondary | ICD-10-CM | POA: Diagnosis not present

## 2017-10-15 DIAGNOSIS — N183 Chronic kidney disease, stage 3 unspecified: Secondary | ICD-10-CM

## 2017-10-15 DIAGNOSIS — J449 Chronic obstructive pulmonary disease, unspecified: Secondary | ICD-10-CM | POA: Diagnosis not present

## 2017-10-15 LAB — URINALYSIS, ROUTINE W REFLEX MICROSCOPIC
BILIRUBIN URINE: NEGATIVE
HGB URINE DIPSTICK: NEGATIVE
KETONES UR: NEGATIVE
Leukocytes, UA: NEGATIVE
NITRITE: NEGATIVE
RBC / HPF: NONE SEEN (ref 0–?)
SPECIFIC GRAVITY, URINE: 1.02 (ref 1.000–1.030)
Total Protein, Urine: NEGATIVE
Urine Glucose: NEGATIVE
Urobilinogen, UA: 0.2 (ref 0.0–1.0)
WBC UA: NONE SEEN (ref 0–?)
pH: 5.5 (ref 5.0–8.0)

## 2017-10-15 LAB — HEPATIC FUNCTION PANEL
ALBUMIN: 4 g/dL (ref 3.5–5.2)
ALK PHOS: 62 U/L (ref 39–117)
ALT: 19 U/L (ref 0–53)
AST: 23 U/L (ref 0–37)
Bilirubin, Direct: 0.2 mg/dL (ref 0.0–0.3)
TOTAL PROTEIN: 7.8 g/dL (ref 6.0–8.3)
Total Bilirubin: 0.8 mg/dL (ref 0.2–1.2)

## 2017-10-15 LAB — CBC WITH DIFFERENTIAL/PLATELET
BASOS ABS: 0.1 10*3/uL (ref 0.0–0.1)
BASOS PCT: 1.7 % (ref 0.0–3.0)
EOS PCT: 4.7 % (ref 0.0–5.0)
Eosinophils Absolute: 0.2 10*3/uL (ref 0.0–0.7)
HEMATOCRIT: 39 % (ref 39.0–52.0)
Hemoglobin: 12.9 g/dL — ABNORMAL LOW (ref 13.0–17.0)
LYMPHS PCT: 26.2 % (ref 12.0–46.0)
Lymphs Abs: 1 10*3/uL (ref 0.7–4.0)
MCHC: 33.1 g/dL (ref 30.0–36.0)
MCV: 86.6 fl (ref 78.0–100.0)
MONOS PCT: 14.5 % — AB (ref 3.0–12.0)
Monocytes Absolute: 0.6 10*3/uL (ref 0.1–1.0)
NEUTROS ABS: 2.1 10*3/uL (ref 1.4–7.7)
Neutrophils Relative %: 52.9 % (ref 43.0–77.0)
PLATELETS: 168 10*3/uL (ref 150.0–400.0)
RBC: 4.51 Mil/uL (ref 4.22–5.81)
RDW: 14 % (ref 11.5–15.5)
WBC: 4 10*3/uL (ref 4.0–10.5)

## 2017-10-15 LAB — BASIC METABOLIC PANEL
BUN: 20 mg/dL (ref 6–23)
CHLORIDE: 104 meq/L (ref 96–112)
CO2: 28 mEq/L (ref 19–32)
CREATININE: 1.59 mg/dL — AB (ref 0.40–1.50)
Calcium: 10.2 mg/dL (ref 8.4–10.5)
GFR: 54.57 mL/min — ABNORMAL LOW (ref >60.00)
Glucose, Bld: 82 mg/dL (ref 70–99)
Potassium: 5 mEq/L (ref 3.5–5.1)
Sodium: 139 mEq/L (ref 135–145)

## 2017-10-15 LAB — LIPID PANEL
CHOLESTEROL: 121 mg/dL (ref 0–200)
HDL: 44.2 mg/dL (ref >39.00)
LDL CALC: 63 mg/dL (ref 0–99)
NonHDL: 76.47
TRIGLYCERIDES: 69 mg/dL (ref 0.0–149.0)
Total CHOL/HDL Ratio: 3
VLDL: 13.8 mg/dL (ref 0.0–40.0)

## 2017-10-15 LAB — HEMOGLOBIN A1C: Hgb A1c MFr Bld: 5.6 % (ref 4.6–6.5)

## 2017-10-15 LAB — TSH: TSH: 3.36 u[IU]/mL (ref 0.35–4.50)

## 2017-10-15 LAB — PSA: PSA: 5.15 ng/mL — ABNORMAL HIGH (ref 0.10–4.00)

## 2017-10-15 MED ORDER — SOLIFENACIN SUCCINATE 5 MG PO TABS: 5.0000 mg | ORAL_TABLET | Freq: Every day | ORAL | 3 refills | Status: DC

## 2017-10-15 NOTE — Progress Notes (Signed)
Subjective:    Patient ID: Gary Levy, male    DOB: Aug 08, 1941, 77 y.o.   MRN: [id number]  HPI  Here for wellness and f/u;  Overall doing ok;  Pt denies Chest pain, worsening SOB, DOE, wheezing, orthopnea, PND, worsening LE edema, palpitations, dizziness or syncope.  Pt denies neurological change such as new headache, facial or extremity weakness.  Pt denies polydipsia, polyuria, or low sugar symptoms. Pt states overall good compliance with treatment and medications, good tolerability, and has been trying to follow appropriate diet.  Pt denies worsening depressive symptoms, suicidal ideation or panic. No fever, night sweats, wt loss, loss of appetite, or other constitutional symptoms.  Pt states good ability with ADL's, has low fall risk, home safety reviewed and adequate, no other significant changes in hearing or vision, and only occasionally active with exercise.  Also has some urinary urgency and freq with nocturia, but good flow.  O/w Denies urinary symptoms such as dysuria, flank pain, hematuria or n/v, fever, chills.  No other interval hx of new complaint Past Medical History:  Diagnosis Date  . Alcohol abuse    hx  . Anemia, iron deficiency   . AR (allergic rhinitis)   . BPH (benign prostatic hypertrophy)   . CAD (coronary artery disease) of artery bypass graft   . CHF (congestive heart failure) ([place])   . CKD (chronic kidney disease) 09/30/2011  . COPD (chronic obstructive pulmonary disease) ([place])   . Crohn's disease ([place])   . Dysuria   . ED (erectile dysfunction) of organic origin   . Fatigue   . Glaucoma   . Glucose intolerance (impaired glucose tolerance)   . HLD (hyperlipidemia)   . HTN (hypertension)   . Impaired glucose tolerance 09/27/2011  . MI (myocardial infarction) ([place])    hx  . Moderate or severe vision impairment, both eyes, impairment level not further specified   . Postherpetic neuralgia   . PSA elevation   . Routine general medical examination at a health  care facility   . Shingles   . Skin cancer    hx; non-melanoma  . Special screening for malignant neoplasm of prostate   . Urinary frequency    Past Surgical History:  Procedure Laterality Date  . CORONARY ANGIOPLASTY WITH STENT PLACEMENT    . CORONARY ARTERY BYPASS GRAFT     11+ years ago; has an EF of about 45%, last Myoview showed no ischemia 12/30/06  . [facility]  2011   Dr. Rush Farmer   . right cataract  12/11   with lens implant     reports that he quit smoking about 36 years ago. he has never used smokeless tobacco. He reports that he does not drink alcohol or use drugs. family history includes Heart disease in his mother. No Known Allergies Current Outpatient Medications on File Prior to Visit  Medication Sig Dispense Refill  . allopurinol (ZYLOPRIM) 100 MG tablet Take 1 tablet (100 mg total) by mouth daily. 90 tablet 3  . aspirin 81 MG EC tablet Take 81 mg by mouth daily.      Marland Kitchen atorvastatin (LIPITOR) 80 MG tablet Take 1 tablet (80 mg total) by mouth daily. Please keep upcoming appointment for further refills 90 tablet 0  . bismuth-metronidazole-tetracycline (PYLERA) 140-125-125 MG per capsule Take 3 capsules by mouth 4 (four) times daily -  before meals and at bedtime. 120 capsule 0  . carvedilol (COREG) 6.25 MG tablet TAKE 1 TABLET TWICE DAILY WITH MEALS 180 tablet 2  .  colchicine 0.6 MG tablet Take 1 tablet (0.6 mg total) by mouth daily. 90 tablet 1  . HYDROcodone-acetaminophen (NORCO/VICODIN) 5-325 MG tablet Take 1-2 tablets by mouth every 4 (four) hours as needed. 10 tablet 0  . Multiple Vitamins-Minerals (CENTRUM PO) Take 1 tablet by mouth daily.      . nitroGLYCERIN (NITROSTAT) 0.4 MG SL tablet Place 0.4 mg under the tongue every 5 (five) minutes as needed for chest pain (Call 911 at 3rd dose within 15 minutes).    Marland Kitchen spironolactone (ALDACTONE) 25 MG tablet Take 1 tablet (25 mg total) by mouth daily. Please keep upcoming appointment for further refills 90 tablet 0  . cetirizine  (ZYRTEC) 10 MG tablet Take 1 tablet (10 mg total) by mouth daily. 30 tablet 11   No current facility-administered medications on file prior to visit.    Review of Systems Constitutional: Negative for other unusual diaphoresis, sweats, appetite or weight changes HENT: Negative for other worsening hearing loss, ear pain, facial swelling, mouth sores or neck stiffness.   Eyes: Negative for other worsening pain, redness or other visual disturbance.  Respiratory: Negative for other stridor or swelling Cardiovascular: Negative for other palpitations or other chest pain  Gastrointestinal: Negative for worsening diarrhea or loose stools, blood in stool, distention or other pain Genitourinary: Negative for hematuria, flank pain or other change in urine volume.  Musculoskeletal: Negative for myalgias or other joint swelling.  Skin: Negative for other color change, or other wound or worsening drainage.  Neurological: Negative for other syncope or numbness. Hematological: Negative for other adenopathy or swelling Psychiatric/Behavioral: Negative for hallucinations, other worsening agitation, SI, self-injury, or new decreased concentration All other system neg per pt    Objective:   Physical Exam BP 122/76   Pulse 74   Temp 98 F (36.7 C) (Oral)   Ht 5\' 10"  (1.778 m)   Wt 167 lb (75.8 kg)   SpO2 98%   BMI 23.96 kg/m  VS noted,  Constitutional: Pt is oriented to person, place, and time. Appears well-developed and well-nourished, in no significant distress and comfortable Head: Normocephalic and atraumatic  Eyes: Conjunctivae and EOM are normal. Pupils are equal, round, and reactive to light Right Ear: External ear normal without discharge Left Ear: External ear normal without discharge Nose: Nose without discharge or deformity Mouth/Throat: Oropharynx is without other ulcerations and moist  Neck: Normal range of motion. Neck supple. No JVD present. No tracheal deviation present or significant  neck LA or mass Cardiovascular: Normal rate, regular rhythm, normal heart sounds and intact distal pulses.   Pulmonary/Chest: WOB normal and breath sounds without rales or wheezing  Abdominal: Soft. Bowel sounds are normal. NT. No HSM  Musculoskeletal: Normal range of motion. Exhibits no edema Lymphadenopathy: Has no other cervical adenopathy.  Neurological: Pt is alert and oriented to person, place, and time. Pt has normal reflexes. No cranial nerve deficit. Motor grossly intact, Gait intact Skin: Skin is warm and dry. No rash noted or new ulcerations Psychiatric:  Has normal mood and affect. Behavior is normal without agitation No other exam findings Lab Results  Component Value Date   WBC 4.0 10/15/2017   HGB 12.9 (L) 10/15/2017   HCT 39.0 10/15/2017   PLT 168.0 10/15/2017   GLUCOSE 82 10/15/2017   CHOL 121 10/15/2017   TRIG 69.0 10/15/2017   HDL 44.20 10/15/2017   LDLCALC 63 10/15/2017   ALT 19 10/15/2017   AST 23 10/15/2017   NA 139 10/15/2017   K 5.0  10/15/2017   CL 104 10/15/2017   CREATININE 1.59 (H) 10/15/2017   BUN 20 10/15/2017   CO2 28 10/15/2017   TSH 3.36 10/15/2017   PSA 5.15 (H) 10/15/2017   HGBA1C 5.6 10/15/2017   MICROALBUR 0.4 03/24/2012      Assessment & Plan:

## 2017-10-15 NOTE — Patient Instructions (Addendum)
Please take all new medication as prescribed - the Vesicare 5 mg per day (for the bladder)  Please call [place] and make sure this is covered; if it is not covered, please ask which similar one is covered under you drug plan  Please continue all other medications as before, and refills have been done if requested.  Please have the pharmacy call with any other refills you may need.  Please continue your efforts at being more active, low cholesterol diet, and weight control.  You are otherwise up to date with prevention measures today.  Please keep your appointments with your specialists as you may have planned  Please go to the LAB in the Basement (turn left off the elevator) for the tests to be done today  You will be contacted by phone if any changes need to be made immediately.  Otherwise, you will receive a letter about your results with an explanation, but please check with MyChart first.  Please remember to sign up for MyChart if you have not done so, as this will be important to you in the future with finding out test results, communicating by private email, and scheduling acute appointments online when needed.  Please return in 6 months, or sooner if needed, with Lab testing done 3-5 days before

## 2017-10-17 ENCOUNTER — Encounter: Payer: Self-pay | Admitting: Internal Medicine

## 2017-10-17 NOTE — Assessment & Plan Note (Signed)

## 2017-10-17 NOTE — Assessment & Plan Note (Signed)
stable overall by history and exam, recent data reviewed with pt, and pt to continue medical treatment as before,  to f/u any worsening symptoms or concerns  

## 2017-10-17 NOTE — Assessment & Plan Note (Signed)
Likely OAB - for vesicare trial or similar covered with his insurance

## 2017-10-17 NOTE — Assessment & Plan Note (Signed)
Lab Results  Component Value Date   HGBA1C 5.6 10/15/2017  stable overall by history and exam, recent data reviewed with pt, and pt to continue medical treatment as before,  to f/u any worsening symptoms or concerns

## 2017-10-17 NOTE — Assessment & Plan Note (Signed)
Has recent good flow, to cont same med tx for now

## 2017-10-22 ENCOUNTER — Other Ambulatory Visit: Payer: Self-pay | Admitting: Internal Medicine

## 2017-11-18 ENCOUNTER — Other Ambulatory Visit: Payer: Self-pay | Admitting: Cardiovascular Disease

## 2017-11-25 ENCOUNTER — Ambulatory Visit: Payer: Medicare HMO | Admitting: Cardiovascular Disease

## 2017-11-30 ENCOUNTER — Ambulatory Visit: Payer: Medicare HMO | Admitting: Cardiovascular Disease

## 2017-11-30 ENCOUNTER — Encounter: Payer: Self-pay | Admitting: Cardiovascular Disease

## 2017-11-30 VITALS — BP 110/70 | HR 52 | Ht 70.0 in | Wt 166.0 lb

## 2017-11-30 DIAGNOSIS — I2510 Atherosclerotic heart disease of native coronary artery without angina pectoris: Secondary | ICD-10-CM

## 2017-11-30 DIAGNOSIS — E7800 Pure hypercholesterolemia, unspecified: Secondary | ICD-10-CM

## 2017-11-30 DIAGNOSIS — I255 Ischemic cardiomyopathy: Secondary | ICD-10-CM

## 2017-11-30 DIAGNOSIS — I5022 Chronic systolic (congestive) heart failure: Secondary | ICD-10-CM | POA: Diagnosis not present

## 2017-11-30 DIAGNOSIS — I10 Essential (primary) hypertension: Secondary | ICD-10-CM | POA: Diagnosis not present

## 2017-11-30 NOTE — Progress Notes (Signed)
Chief Complaint  Patient presents with  . Coronary Artery Disease     History of Present Illness: 77 yo male with history of CAD s/p 4V CABG in 1995, ischemic cardiomyopathy, HTN, HLD, COPD, CKD here today for cardiac follow up. He has CAD dating back to the 1990s with prior PCI and CABG. The last cath report I can find is scanned into EPIC from 1999 and shows failure of the LIMA graft to the LAD and the SVG to the RCA. There was high grade disease in the SVG to the OM and the SVG to the Diagonal. It appears that the LAD was treated with rotablator atherectomy and stenting in 1999. The SVG to the OM and the SVG to the Diagonal were treated with angioplasty. I saw him 07/28/13 and he had c/o exertional chest pains, resolved with rest. Stress myoview 08/17/13 Intermediate risk study with a fixed large, severe inferolateral, basal to mid inferior, and mid to apical anterior perfusion defect. This suggested prior infarction with no significant ischemia. Last echo December 2014 with LVEF=35%.   He is here today for follow up. The patient denies any chest pain, dyspnea, palpitations, lower extremity edema, orthopnea, PND, dizziness, near syncope or syncope.   Primary Care Physician: Biagio Borg, MD   Past Medical History:  Diagnosis Date  . Alcohol abuse    hx  . Anemia, iron deficiency   . AR (allergic rhinitis)   . BPH (benign prostatic hypertrophy)   . CAD (coronary artery disease) of artery bypass graft   . CHF (congestive heart failure) ([place])   . CKD (chronic kidney disease) 09/30/2011  . COPD (chronic obstructive pulmonary disease) ([place])   . Crohn's disease ([place])   . Dysuria   . ED (erectile dysfunction) of organic origin   . Fatigue   . Glaucoma   . Glucose intolerance (impaired glucose tolerance)   . HLD (hyperlipidemia)   . HTN (hypertension)   . Impaired glucose tolerance 09/27/2011  . MI (myocardial infarction) ([place])    hx  . Moderate or severe vision impairment, both eyes,  impairment level not further specified   . Postherpetic neuralgia   . PSA elevation   . Routine general medical examination at a health care facility   . Shingles   . Skin cancer    hx; non-melanoma  . Special screening for malignant neoplasm of prostate   . Urinary frequency     Past Surgical History:  Procedure Laterality Date  . CORONARY ANGIOPLASTY WITH STENT PLACEMENT    . CORONARY ARTERY BYPASS GRAFT     11+ years ago; has an EF of about 45%, last Myoview showed no ischemia 12/30/06  . [facility]  2011   Dr. Rush Farmer   . right cataract  12/11   with lens implant     Current Outpatient Medications  Medication Sig Dispense Refill  . allopurinol (ZYLOPRIM) 100 MG tablet Take 1 tablet (100 mg total) by mouth daily. 90 tablet 3  . aspirin 81 MG EC tablet Take 81 mg by mouth daily.      Marland Kitchen atorvastatin (LIPITOR) 80 MG tablet Take 1 tablet (80 mg total) by mouth daily at 6 PM. Please keep upcoming appt for future refills. Thank you 90 tablet 0  . bismuth-metronidazole-tetracycline (PYLERA) 140-125-125 MG per capsule Take 3 capsules by mouth 4 (four) times daily -  before meals and at bedtime. 120 capsule 0  . carvedilol (COREG) 6.25 MG tablet TAKE 1 TABLET TWICE DAILY WITH  MEALS 180 tablet 2  . colchicine 0.6 MG tablet Take 1 tablet (0.6 mg total) by mouth daily. 90 tablet 1  . HYDROcodone-acetaminophen (NORCO/VICODIN) 5-325 MG tablet Take 1-2 tablets by mouth every 4 (four) hours as needed. 10 tablet 0  . Multiple Vitamins-Minerals (CENTRUM PO) Take 1 tablet by mouth daily.      . nitroGLYCERIN (NITROSTAT) 0.4 MG SL tablet Place 0.4 mg under the tongue every 5 (five) minutes as needed for chest pain (Call 911 at 3rd dose within 15 minutes).    Marland Kitchen spironolactone (ALDACTONE) 25 MG tablet TAKE 1 TABLET (25 MG TOTAL) BY MOUTH DAILY. PLEASE KEEP UPCOMING APPOINTMENT FOR FURTHER REFILLS 90 tablet 0  . VESICARE 5 MG tablet TAKE 1 TABLET EVERY DAY 90 tablet 3  . cetirizine (ZYRTEC) 10 MG tablet Take  1 tablet (10 mg total) by mouth daily. 30 tablet 11   No current facility-administered medications for this visit.     No Known Allergies  Social History   Socioeconomic History  . Marital status: Married    Spouse name: Not on file  . Number of children: Not on file  . Years of education: Not on file  . Highest education level: Not on file  Occupational History  . Not on file  Social Needs  . Financial resource strain: Not on file  . Food insecurity:    Worry: Not on file    Inability: Not on file  . Transportation needs:    Medical: Not on file    Non-medical: Not on file  Tobacco Use  . Smoking status: Former Smoker    Last attempt to quit: 03/20/1981    Years since quitting: 36.7  . Smokeless tobacco: Never Used  Substance and Sexual Activity  . Alcohol use: No  . Drug use: No  . Sexual activity: Not on file  Lifestyle  . Physical activity:    Days per week: Not on file    Minutes per session: Not on file  . Stress: Not on file  Relationships  . Social connections:    Talks on phone: Not on file    Gets together: Not on file    Attends religious service: Not on file    Active member of club or organization: Not on file    Attends meetings of clubs or organizations: Not on file    Relationship status: Not on file  . Intimate partner violence:    Fear of current or ex partner: Not on file    Emotionally abused: Not on file    Physically abused: Not on file    Forced sexual activity: Not on file  Other Topics Concern  . Not on file  Social History Narrative   Married, 4 children; retired - Museum/gallery curator.      Designated party release on file. [employer]. 01/15/10.     Family History  Problem Relation Age of Onset  . Heart disease Mother     Review of Systems:  As stated in the HPI and otherwise negative.   BP 110/70   Pulse (!) 52   Ht 5\' 10"  (1.778 m)   Wt 166 lb (75.3 kg)   SpO2 99%   BMI 23.82 kg/m   Physical Examination:  General:  Well developed, well nourished, NAD  HEENT: OP clear, mucus membranes moist  SKIN: warm, dry. No rashes. Neuro: No focal deficits  Musculoskeletal: Muscle strength 5/5 all ext  Psychiatric: Mood and affect normal  Neck: No  JVD, no carotid bruits, no thyromegaly, no lymphadenopathy.  Lungs:Clear bilaterally, no wheezes, rhonci, crackles Cardiovascular: Regular rate and rhythm. No murmurs, gallops or rubs. Abdomen:Soft. Bowel sounds present. Non-tender.  Extremities: No lower extremity edema. Pulses are 2 + in the bilateral DP/PT.  EKG:  EKG is not ordered today. The ekg ordered today demonstrates Sinus brady, rate 52 bpm. Old lateral infarct. Chronic ST and T wave abn inferior leads and anterolateral leads.   Recent Labs: 10/15/2017: ALT 19; BUN 20; Creatinine, Ser 1.59; Hemoglobin 12.9; Platelets 168.0; Potassium 5.0; Sodium 139; TSH 3.36   Lipid Panel    Component Value Date/Time   CHOL 121 10/15/2017 0947   TRIG 69.0 10/15/2017 0947   HDL 44.20 10/15/2017 0947   CHOLHDL 3 10/15/2017 0947   VLDL 13.8 10/15/2017 0947   LDLCALC 63 10/15/2017 0947     Wt Readings from Last 3 Encounters:  11/30/17 166 lb (75.3 kg)  10/15/17 167 lb (75.8 kg)  04/14/17 167 lb (75.8 kg)     Other studies Reviewed: Additional studies/ records that were reviewed today include: . Review of the above records demonstrates:    Assessment and Plan:   1. CAD s/p CABG without angina: No chest pain. Will continue ASA, statin and beta blocker.    2. HTN: BP is controlled. NO changes.   3. Hyperlipidemia: LDL at goal. Continue statin.   4. Ischemic cardiomyopathy: Last LVEF 35% by echo in 2014. Will continue Coreg and aldactone. He is not on an Ace -inh or ARB due to renal insufficiency. Will repeat echo now.   5. Chronic systolic CHF: Volume status is ok today.   Current medicines are reviewed at length with the patient today.  The patient does not have concerns regarding medicines.  The following  changes have been made:  no change  Labs/ tests ordered today include:   Orders Placed This Encounter  Procedures  . EKG 12-Lead  . ECHOCARDIOGRAM COMPLETE    Disposition:   FU with me in 12  months  Signed, Lauree Chandler, MD 11/30/2017 3:05 PM    [facility] [place], [place], [place]  [address] Phone: [phone]; Fax: [phone]

## 2017-11-30 NOTE — Patient Instructions (Signed)

## 2017-12-01 ENCOUNTER — Other Ambulatory Visit: Payer: Self-pay

## 2017-12-01 ENCOUNTER — Ambulatory Visit (HOSPITAL_COMMUNITY): Payer: Medicare HMO | Attending: Cardiovascular Disease

## 2017-12-01 DIAGNOSIS — E785 Hyperlipidemia, unspecified: Secondary | ICD-10-CM | POA: Diagnosis not present

## 2017-12-01 DIAGNOSIS — I2510 Atherosclerotic heart disease of native coronary artery without angina pectoris: Secondary | ICD-10-CM | POA: Diagnosis not present

## 2017-12-01 DIAGNOSIS — I252 Old myocardial infarction: Secondary | ICD-10-CM | POA: Insufficient documentation

## 2017-12-01 DIAGNOSIS — I081 Rheumatic disorders of both mitral and tricuspid valves: Secondary | ICD-10-CM | POA: Insufficient documentation

## 2017-12-01 DIAGNOSIS — I10 Essential (primary) hypertension: Secondary | ICD-10-CM | POA: Insufficient documentation

## 2017-12-01 DIAGNOSIS — I371 Nonrheumatic pulmonary valve insufficiency: Secondary | ICD-10-CM | POA: Insufficient documentation

## 2017-12-01 DIAGNOSIS — J449 Chronic obstructive pulmonary disease, unspecified: Secondary | ICD-10-CM | POA: Insufficient documentation

## 2017-12-01 DIAGNOSIS — I255 Ischemic cardiomyopathy: Secondary | ICD-10-CM | POA: Insufficient documentation

## 2017-12-01 DIAGNOSIS — Z87891 Personal history of nicotine dependence: Secondary | ICD-10-CM | POA: Insufficient documentation

## 2017-12-01 MED ORDER — PERFLUTREN LIPID MICROSPHERE
1.0000 mL | INTRAVENOUS | Status: AC | PRN
  Administered 2017-12-01: 2 mL via INTRAVENOUS

## 2018-01-20 ENCOUNTER — Other Ambulatory Visit: Payer: Self-pay | Admitting: Cardiovascular Disease

## 2018-04-06 ENCOUNTER — Other Ambulatory Visit: Payer: Self-pay | Admitting: Internal Medicine

## 2018-04-15 ENCOUNTER — Ambulatory Visit: Payer: Medicare HMO

## 2018-04-16 ENCOUNTER — Other Ambulatory Visit (INDEPENDENT_AMBULATORY_CARE_PROVIDER_SITE_OTHER): Payer: Medicare HMO

## 2018-04-16 ENCOUNTER — Encounter: Payer: Self-pay | Admitting: Internal Medicine

## 2018-04-16 ENCOUNTER — Ambulatory Visit (INDEPENDENT_AMBULATORY_CARE_PROVIDER_SITE_OTHER): Payer: Medicare HMO | Admitting: Internal Medicine

## 2018-04-16 VITALS — BP 118/76 | HR 74 | Temp 97.6°F | Ht 70.0 in | Wt 168.0 lb

## 2018-04-16 DIAGNOSIS — N183 Chronic kidney disease, stage 3 unspecified: Secondary | ICD-10-CM

## 2018-04-16 DIAGNOSIS — E785 Hyperlipidemia, unspecified: Secondary | ICD-10-CM

## 2018-04-16 DIAGNOSIS — R972 Elevated prostate specific antigen [PSA]: Secondary | ICD-10-CM

## 2018-04-16 DIAGNOSIS — Z23 Encounter for immunization: Secondary | ICD-10-CM | POA: Diagnosis not present

## 2018-04-16 DIAGNOSIS — I10 Essential (primary) hypertension: Secondary | ICD-10-CM | POA: Diagnosis not present

## 2018-04-16 DIAGNOSIS — J449 Chronic obstructive pulmonary disease, unspecified: Secondary | ICD-10-CM | POA: Diagnosis not present

## 2018-04-16 DIAGNOSIS — R7302 Impaired glucose tolerance (oral): Secondary | ICD-10-CM | POA: Diagnosis not present

## 2018-04-16 LAB — BASIC METABOLIC PANEL
BUN: 19 mg/dL (ref 6–23)
CALCIUM: 9.9 mg/dL (ref 8.4–10.5)
CO2: 29 meq/L (ref 19–32)
CREATININE: 1.57 mg/dL — AB (ref 0.40–1.50)
Chloride: 105 mEq/L (ref 96–112)
GFR: 55.3 mL/min — AB (ref >60.00)
Glucose, Bld: 97 mg/dL (ref 70–99)
Potassium: 4.2 mEq/L (ref 3.5–5.1)
Sodium: 139 mEq/L (ref 135–145)

## 2018-04-16 LAB — HEPATIC FUNCTION PANEL
ALBUMIN: 4.1 g/dL (ref 3.5–5.2)
ALT: 20 U/L (ref 0–53)
AST: 25 U/L (ref 0–37)
Alkaline Phosphatase: 61 U/L (ref 39–117)
Bilirubin, Direct: 0.2 mg/dL (ref 0.0–0.3)
TOTAL PROTEIN: 7.4 g/dL (ref 6.0–8.3)
Total Bilirubin: 1.1 mg/dL (ref 0.2–1.2)

## 2018-04-16 LAB — CBC WITH DIFFERENTIAL/PLATELET
BASOS PCT: 1.4 % (ref 0.0–3.0)
Basophils Absolute: 0.1 10*3/uL (ref 0.0–0.1)
EOS PCT: 3.8 % (ref 0.0–5.0)
Eosinophils Absolute: 0.2 10*3/uL (ref 0.0–0.7)
HEMATOCRIT: 37.1 % — AB (ref 39.0–52.0)
HEMOGLOBIN: 12.4 g/dL — AB (ref 13.0–17.0)
LYMPHS PCT: 24.1 % (ref 12.0–46.0)
Lymphs Abs: 1 10*3/uL (ref 0.7–4.0)
MCHC: 33.5 g/dL (ref 30.0–36.0)
MCV: 85.1 fl (ref 78.0–100.0)
Monocytes Absolute: 0.6 10*3/uL (ref 0.1–1.0)
Monocytes Relative: 14.3 % — ABNORMAL HIGH (ref 3.0–12.0)
NEUTROS ABS: 2.3 10*3/uL (ref 1.4–7.7)
Neutrophils Relative %: 56.4 % (ref 43.0–77.0)
PLATELETS: 170 10*3/uL (ref 150.0–400.0)
RBC: 4.36 Mil/uL (ref 4.22–5.81)
RDW: 14.7 % (ref 11.5–15.5)
WBC: 4 10*3/uL (ref 4.0–10.5)

## 2018-04-16 LAB — PSA: PSA: 5.49 ng/mL — ABNORMAL HIGH (ref 0.10–4.00)

## 2018-04-16 NOTE — Assessment & Plan Note (Signed)
stable overall by history and exam, recent data reviewed with pt, and pt to continue medical treatment as before,  to f/u any worsening symptoms or concerns Lab Results  Component Value Date   LDLCALC 63 10/15/2017

## 2018-04-16 NOTE — Assessment & Plan Note (Signed)
stable overall by history and exam, recent data reviewed with pt, and pt to continue medical treatment as before,  to f/u any worsening symptoms or concerns, for f/u a1c 

## 2018-04-16 NOTE — Assessment & Plan Note (Signed)
stable overall by history and exam, recent data reviewed with pt, and pt to continue medical treatment as before,  to f/u any worsening symptoms or concerns Lab Results  Component Value Date   CREATININE 1.59 (H) 10/15/2017   For f/u lab

## 2018-04-16 NOTE — Assessment & Plan Note (Signed)
stable overall by history and exam, recent data reviewed with pt, and pt to continue medical treatment as before,  to f/u any worsening symptoms or concerns BP Readings from Last 3 Encounters:  04/16/18 118/76  11/30/17 110/70  10/15/17 122/76

## 2018-04-16 NOTE — Patient Instructions (Addendum)
You had the flu shot today  Please continue all other medications as before, and refills have been done if requested.  Please have the pharmacy call with any other refills you may need.  Please continue your efforts at being more active, low cholesterol diet, and weight control.  Please keep your appointments with your specialists as you may have planned  Please go to the LAB in the Basement (turn left off the elevator) for the tests to be done today  You will be contacted by phone if any changes need to be made immediately.  Otherwise, you will receive a letter about your results with an explanation, but please check with MyChart first.  Please remember to sign up for MyChart if you have not done so, as this will be important to you in the future with finding out test results, communicating by private email, and scheduling acute appointments online when needed.  Please return in 6 months, or sooner if needed, with Lab testing done 3-5 days before

## 2018-04-16 NOTE — Assessment & Plan Note (Signed)
For f/u psa today, asympt

## 2018-04-16 NOTE — Progress Notes (Signed)
Subjective:    Patient ID: Gary Levy, male    DOB: November 23, 1940, 77 y.o.   MRN: [id number]  HPI  Here to f/u; overall doing ok,  Pt denies chest pain, increasing sob or doe, wheezing, orthopnea, PND, increased LE swelling, palpitations, dizziness or syncope.  Pt denies new neurological symptoms such as new headache, or facial or extremity weakness or numbness.  Pt denies polydipsia, polyuria, or low sugar episode.  Pt states overall good compliance with meds, mostly trying to follow appropriate diet, with wt overall stable,  Still goes to gym most days.  No new complaints  Denies urinary symptoms such as dysuria, frequency, urgency, flank pain, hematuria or n/v, fever, chills. Wt Readings from Last 3 Encounters:  04/16/18 168 lb (76.2 kg)  11/30/17 166 lb (75.3 kg)  10/15/17 167 lb (75.8 kg)   Past Medical History:  Diagnosis Date  . Alcohol abuse    hx  . Anemia, iron deficiency   . AR (allergic rhinitis)   . BPH (benign prostatic hypertrophy)   . CAD (coronary artery disease) of artery bypass graft   . CHF (congestive heart failure) ([place])   . CKD (chronic kidney disease) 09/30/2011  . COPD (chronic obstructive pulmonary disease) ([place])   . Crohn's disease ([place])   . Dysuria   . ED (erectile dysfunction) of organic origin   . Fatigue   . Glaucoma   . Glucose intolerance (impaired glucose tolerance)   . HLD (hyperlipidemia)   . HTN (hypertension)   . Impaired glucose tolerance 09/27/2011  . MI (myocardial infarction) ([place])    hx  . Moderate or severe vision impairment, both eyes, impairment level not further specified   . Postherpetic neuralgia   . PSA elevation   . Routine general medical examination at a health care facility   . Shingles   . Skin cancer    hx; non-melanoma  . Special screening for malignant neoplasm of prostate   . Urinary frequency    Past Surgical History:  Procedure Laterality Date  . CORONARY ANGIOPLASTY WITH STENT PLACEMENT    . CORONARY ARTERY  BYPASS GRAFT     11+ years ago; has an EF of about 45%, last Myoview showed no ischemia 12/30/06  . [facility]  2011   Dr. Rush Farmer   . right cataract  12/11   with lens implant     reports that he quit smoking about 37 years ago. He has never used smokeless tobacco. He reports that he does not drink alcohol or use drugs. family history includes Heart disease in his mother. No Known Allergies Current Outpatient Medications on File Prior to Visit  Medication Sig Dispense Refill  . allopurinol (ZYLOPRIM) 100 MG tablet Take 1 tablet (100 mg total) by mouth daily. 90 tablet 3  . aspirin 81 MG EC tablet Take 81 mg by mouth daily.      Marland Kitchen atorvastatin (LIPITOR) 80 MG tablet Take 1 tablet (80 mg total) by mouth daily at 6 PM. 90 tablet 3  . bismuth-metronidazole-tetracycline (PYLERA) 140-125-125 MG per capsule Take 3 capsules by mouth 4 (four) times daily -  before meals and at bedtime. 120 capsule 0  . carvedilol (COREG) 6.25 MG tablet TAKE 1 TABLET TWICE DAILY WITH MEALS 180 tablet 1  . colchicine 0.6 MG tablet Take 1 tablet (0.6 mg total) by mouth daily. 90 tablet 1  . HYDROcodone-acetaminophen (NORCO/VICODIN) 5-325 MG tablet Take 1-2 tablets by mouth every 4 (four) hours as needed. 10 tablet 0  .  Multiple Vitamins-Minerals (CENTRUM PO) Take 1 tablet by mouth daily.      . nitroGLYCERIN (NITROSTAT) 0.4 MG SL tablet Place 0.4 mg under the tongue every 5 (five) minutes as needed for chest pain (Call 911 at 3rd dose within 15 minutes).    Marland Kitchen spironolactone (ALDACTONE) 25 MG tablet Take 1 tablet (25 mg total) by mouth daily. 90 tablet 3  . VESICARE 5 MG tablet TAKE 1 TABLET EVERY DAY 90 tablet 3  . cetirizine (ZYRTEC) 10 MG tablet Take 1 tablet (10 mg total) by mouth daily. 30 tablet 11   No current facility-administered medications on file prior to visit.    Review of Systems  Constitutional: Negative for other unusual diaphoresis or sweats HENT: Negative for ear discharge or swelling Eyes: Negative for  other worsening visual disturbances Respiratory: Negative for stridor or other swelling  Gastrointestinal: Negative for worsening distension or other blood Genitourinary: Negative for retention or other urinary change Musculoskeletal: Negative for other MSK pain or swelling Skin: Negative for color change or other new lesions Neurological: Negative for worsening tremors and other numbness  Psychiatric/Behavioral: Negative for worsening agitation or other fatigue All other system neg per pt    Objective:   Physical Exam BP 118/76   Pulse 74   Temp 97.6 F (36.4 C) (Oral)   Ht 5\' 10"  (1.778 m)   Wt 168 lb (76.2 kg)   SpO2 96%   BMI 24.11 kg/m  VS noted,  Constitutional: Pt appears in NAD HENT: Head: NCAT.  Right Ear: External ear normal.  Left Ear: External ear normal.  Eyes: . Pupils are equal, round, and reactive to light. Conjunctivae and EOM are normal Nose: without d/c or deformity Neck: Neck supple. Gross normal ROM Cardiovascular: Normal rate and regular rhythm.   Pulmonary/Chest: Effort normal and breath sounds without rales or wheezing.  Abd:  Soft, NT, ND, + BS, no organomegaly Neurological: Pt is alert. At baseline orientation, motor grossly intact Skin: Skin is warm. No rashes, other new lesions, no LE edema Psychiatric: Pt behavior is normal without agitation  No other exam findings Lab Results  Component Value Date   WBC 4.0 10/15/2017   HGB 12.9 (L) 10/15/2017   HCT 39.0 10/15/2017   PLT 168.0 10/15/2017   GLUCOSE 82 10/15/2017   CHOL 121 10/15/2017   TRIG 69.0 10/15/2017   HDL 44.20 10/15/2017   LDLCALC 63 10/15/2017   ALT 19 10/15/2017   AST 23 10/15/2017   NA 139 10/15/2017   K 5.0 10/15/2017   CL 104 10/15/2017   CREATININE 1.59 (H) 10/15/2017   BUN 20 10/15/2017   CO2 28 10/15/2017   TSH 3.36 10/15/2017   PSA 5.15 (H) 10/15/2017   HGBA1C 5.6 10/15/2017   MICROALBUR 0.4 03/24/2012          Assessment & Plan:

## 2018-04-21 NOTE — Progress Notes (Addendum)
Subjective:   Macallister Ashmead is a 77 y.o. male who presents for an Initial Medicare Annual Wellness Visit.  Review of Systems  No ROS.  Medicare Wellness Visit. Additional risk factors are reflected in the social history.  Cardiac Risk Factors include: advanced age (>50men, >48 women);dyslipidemia;hypertension;male gender Sleep patterns: feels rested on waking, gets up 1-2 times nightly to void and sleeps 5-6 hours nightly. Patient states   Home Safety/Smoke Alarms: Feels safe in home. Smoke alarms in place.  Living environment; residence and Firearm Safety: 1-story house/ trailer, no firearms Lives with wife, no needs for DME, good support system. Seat Belt Safety/Bike Helmet: Wears seat belt.   PSA-  Lab Results  Component Value Date   PSA 5.49 (H) 04/16/2018   PSA 5.15 (H) 10/15/2017   PSA 5.20 (H) 10/14/2016      Objective:    Today's Vitals   04/22/18 0819  BP: 111/72  Pulse: 61  Resp: 18  SpO2: 98%  Weight: 166 lb (75.3 kg)  Height: 5\' 10"  (1.778 m)   Body mass index is 23.82 kg/m.  Advanced Directives 04/22/2018 12/31/2016 10/14/2016  Does Patient Have a Medical Advance Directive? No No Yes  Type of Advance Directive - - Living will  Would patient like information on creating a medical advance directive? No - Patient declined No - Patient declined -    Current Medications (verified) Outpatient Encounter Medications as of 04/22/2018  Medication Sig  . allopurinol (ZYLOPRIM) 100 MG tablet Take 1 tablet (100 mg total) by mouth daily. (Patient taking differently: Take 100 mg by mouth daily. )  . aspirin 81 MG EC tablet Take 81 mg by mouth daily.    Marland Kitchen atorvastatin (LIPITOR) 80 MG tablet Take 1 tablet (80 mg total) by mouth daily at 6 PM.  . bismuth-metronidazole-tetracycline (PYLERA) 140-125-125 MG per capsule Take 3 capsules by mouth 4 (four) times daily -  before meals and at bedtime.  . carvedilol (COREG) 6.25 MG tablet TAKE 1 TABLET TWICE DAILY WITH MEALS  .  colchicine 0.6 MG tablet Take 1 tablet (0.6 mg total) by mouth daily.  Marland Kitchen HYDROcodone-acetaminophen (NORCO/VICODIN) 5-325 MG tablet Take 1-2 tablets by mouth every 4 (four) hours as needed.  . Multiple Vitamins-Minerals (CENTRUM PO) Take 1 tablet by mouth daily.    . nitroGLYCERIN (NITROSTAT) 0.4 MG SL tablet Place 0.4 mg under the tongue every 5 (five) minutes as needed for chest pain (Call 911 at 3rd dose within 15 minutes).  Marland Kitchen spironolactone (ALDACTONE) 25 MG tablet Take 1 tablet (25 mg total) by mouth daily.  . VESICARE 5 MG tablet TAKE 1 TABLET EVERY DAY  . cetirizine (ZYRTEC) 10 MG tablet Take 1 tablet (10 mg total) by mouth daily.   No facility-administered encounter medications on file as of 04/22/2018.     Allergies (verified) Patient has no known allergies.   History: Past Medical History:  Diagnosis Date  . Alcohol abuse    hx  . Anemia, iron deficiency   . AR (allergic rhinitis)   . BPH (benign prostatic hypertrophy)   . CAD (coronary artery disease) of artery bypass graft   . CHF (congestive heart failure) ([place])   . CKD (chronic kidney disease) 09/30/2011  . COPD (chronic obstructive pulmonary disease) ([place])   . Crohn's disease ([place])   . Dysuria   . ED (erectile dysfunction) of organic origin   . Fatigue   . Glaucoma   . Glucose intolerance (impaired glucose tolerance)   . HLD (  hyperlipidemia)   . HTN (hypertension)   . Impaired glucose tolerance 09/27/2011  . MI (myocardial infarction) ([place])    hx  . Moderate or severe vision impairment, both eyes, impairment level not further specified   . Postherpetic neuralgia   . PSA elevation   . Routine general medical examination at a health care facility   . Shingles   . Skin cancer    hx; non-melanoma  . Special screening for malignant neoplasm of prostate   . Urinary frequency    Past Surgical History:  Procedure Laterality Date  . CORONARY ANGIOPLASTY WITH STENT PLACEMENT    . CORONARY ARTERY BYPASS GRAFT     11+  years ago; has an EF of about 45%, last Myoview showed no ischemia 12/30/06  . [facility]  2011   Dr. Rush Farmer   . right cataract  12/11   with lens implant    Family History  Problem Relation Age of Onset  . Heart disease Mother    Social History   Socioeconomic History  . Marital status: Married    Spouse name: Not on file  . Number of children: 6  . Years of education: Not on file  . Highest education level: Not on file  Occupational History  . Not on file  Social Needs  . Financial resource strain: Not hard at all  . Food insecurity:    Worry: Never true    Inability: Never true  . Transportation needs:    Medical: No    Non-medical: No  Tobacco Use  . Smoking status: Former Smoker    Last attempt to quit: 03/20/1981    Years since quitting: 37.1  . Smokeless tobacco: Never Used  Substance and Sexual Activity  . Alcohol use: No  . Drug use: No  . Sexual activity: Yes  Lifestyle  . Physical activity:    Days per week: 4 days    Minutes per session: 50 min  . Stress: Not at all  Relationships  . Social connections:    Talks on phone: More than three times a week    Gets together: More than three times a week    Attends religious service: Not on file    Active member of club or organization: Not on file    Attends meetings of clubs or organizations: Not on file    Relationship status: Married  Other Topics Concern  . Not on file  Social History Narrative   Married, 4 children; retired - Museum/gallery curator.      Designated party release on file. [employer]. 01/15/10.    Tobacco Counseling Counseling given: Not Answered  Activities of Daily Living In your present state of health, do you have any difficulty performing the following activities: 04/22/2018  Hearing? N  Vision? N  Difficulty concentrating or making decisions? N  Walking or climbing stairs? N  Dressing or bathing? N  Doing errands, shopping? N  Preparing Food and eating ? N  Using the Toilet? N  In  the past six months, have you accidently leaked urine? N  Do you have problems with loss of bowel control? N  Managing your Medications? N  Managing your Finances? N  Housekeeping or managing your Housekeeping? N  Some recent data might be hidden     Immunizations and Health Maintenance Immunization History  Administered Date(s) Administered  . Influenza Split 05/05/2012  . Influenza Whole 08/15/2009, 06/11/2010  . Influenza, High Dose Seasonal PF 06/07/2013, 04/12/2015, 07/01/2016, 04/14/2017, 04/16/2018  .  Influenza,inj,Quad PF,6+ Mos 04/11/2014  . Pneumococcal Conjugate-13 10/12/2013  . Pneumococcal Polysaccharide-23 08/19/1995, 08/15/2009  . Td 08/19/1995, 08/15/2009   There are no preventive care reminders to display for this patient.  Patient Care Team: Biagio Borg, MD as PCP - General Angelena Form Annita Brod, MD as PCP - Cardiology (Cardiology)  Indicate any recent Medical Services you may have received from other than [facility] providers in the past year (date may be approximate).    Assessment:   This is a routine wellness examination for Olivia. Physical assessment deferred to PCP.   Hearing/Vision screen Hearing Screening Comments: Able to hear conversational tones w/o difficulty. No issues reported.  Passed whisper test Vision Screening Comments: appointment yearly Dr. Bing Plume  Dietary issues and exercise activities discussed: Current Exercise Habits: Home exercise routine, Type of exercise: walking, Time (Minutes): 30, Frequency (Times/Week): 5, Weekly Exercise (Minutes/Week): 150, Intensity: Mild, Exercise limited by: None identified  Diet (meal preparation, eat out, water intake, caffeinated beverages, dairy products, fruits and vegetables): in general, a "healthy" diet  , well balanced   Reviewed heart healthy  Diet. Encouraged patient to increase daily water and healthy fluid intake.  Goals    . Patient Stated     Continue to be as active as possible. Enjoy  life and family.       Depression Screen PHQ 2/9 Scores 04/22/2018 10/15/2017 10/14/2016 10/12/2015  PHQ - 2 Score 0 0 0 0  PHQ- 9 Score - 0 - -    Fall Risk Fall Risk  04/22/2018 10/15/2017 04/14/2017 10/14/2016 10/12/2015  Falls in the past year? No No No No No   Cognitive Function: MMSE - Mini Mental State Exam 04/22/2018  Orientation to time 5  Orientation to Place 5  Registration 3  Attention/ Calculation 0  Attention/Calculation-comments states he cannot do this  Recall 2  Language- name 2 objects 2  Language- repeat 1  Language- follow 3 step command 3  Language- read & follow direction 1  Write a sentence 1  Copy design 1  Total score 24        Screening Tests Health Maintenance  Topic Date Due  . TETANUS/TDAP  08/16/2019  . INFLUENZA VACCINE  Completed  . PNA vac Low Risk Adult  Completed      Plan:     Continue doing brain stimulating activities (puzzles, reading, adult coloring books, staying active) to keep memory sharp.   Continue to eat heart healthy diet (full of fruits, vegetables, whole grains, lean protein, water--limit salt, fat, and sugar intake) and increase physical activity as tolerated.  I have personally reviewed and noted the following in the patient's chart:   . Medical and social history . Use of alcohol, tobacco or illicit drugs  . Current medications and supplements . Functional ability and status . Nutritional status . Physical activity . Advanced directives . List of other physicians . Vitals . Screenings to include cognitive, depression, and falls . Referrals and appointments  In addition, I have reviewed and discussed with patient certain preventive protocols, quality metrics, and best practice recommendations. A written personalized care plan for preventive services as well as general preventive health recommendations were provided to patient.     Michiel Cowboy, RN   04/22/2018      Medical screening  examination/treatment/procedure(s) were performed by non-physician practitioner and as supervising physician I was immediately available for consultation/collaboration. I agree with above. Cathlean Cower, MD

## 2018-04-22 ENCOUNTER — Ambulatory Visit (INDEPENDENT_AMBULATORY_CARE_PROVIDER_SITE_OTHER): Payer: Medicare HMO | Admitting: *Deleted

## 2018-04-22 VITALS — BP 111/72 | HR 61 | Resp 18 | Ht 70.0 in | Wt 166.0 lb

## 2018-04-22 DIAGNOSIS — Z0000 Encounter for general adult medical examination without abnormal findings: Secondary | ICD-10-CM

## 2018-04-22 NOTE — Patient Instructions (Addendum)
Continue doing brain stimulating activities (puzzles, reading, adult coloring books, staying active) to keep memory sharp.   Continue to eat heart healthy diet (full of fruits, vegetables, whole grains, lean protein, water--limit salt, fat, and sugar intake) and increase physical activity as tolerated.   Gary Levy , Thank you for taking time to come for your Medicare Wellness Visit. I appreciate your ongoing commitment to your health goals. Please review the following plan we discussed and let me know if I can assist you in the future.   These are the goals we discussed: Goals    . Patient Stated     Continue to be as active as possible. Enjoy life and family.        This is a list of the screening recommended for you and due dates:  Health Maintenance  Topic Date Due  . Tetanus Vaccine  08/16/2019  . Flu Shot  Completed  . Pneumonia vaccines  Completed     Health Maintenance, Male A healthy lifestyle and preventive care is important for your health and wellness. Ask your health care provider about what schedule of regular examinations is right for you. What should I know about weight and diet? Eat a Healthy Diet  Eat plenty of vegetables, fruits, whole grains, low-fat dairy products, and lean protein.  Do not eat a lot of foods high in solid fats, added sugars, or salt.  Maintain a Healthy Weight Regular exercise can help you achieve or maintain a healthy weight. You should:  Do at least 150 minutes of exercise each week. The exercise should increase your heart rate and make you sweat (moderate-intensity exercise).  Do strength-training exercises at least twice a week.  Watch Your Levels of Cholesterol and Blood Lipids  Have your blood tested for lipids and cholesterol every 5 years starting at 77 years of age. If you are at high risk for heart disease, you should start having your blood tested when you are 77 years old. You may need to have your cholesterol levels checked  more often if: ? Your lipid or cholesterol levels are high. ? You are older than 77 years of age. ? You are at high risk for heart disease.  What should I know about cancer screening? Many types of cancers can be detected early and may often be prevented. Lung Cancer  You should be screened every year for lung cancer if: ? You are a current smoker who has smoked for at least 30 years. ? You are a former smoker who has quit within the past 15 years.  Talk to your health care provider about your screening options, when you should start screening, and how often you should be screened.  Colorectal Cancer  Routine colorectal cancer screening usually begins at 77 years of age and should be repeated every 5-10 years until you are 77 years old. You may need to be screened more often if early forms of precancerous polyps or small growths are found. Your health care provider may recommend screening at an earlier age if you have risk factors for colon cancer.  Your health care provider may recommend using home test kits to check for hidden blood in the stool.  A small camera at the end of a tube can be used to examine your colon (sigmoidoscopy or colonoscopy). This checks for the earliest forms of colorectal cancer.  Prostate and Testicular Cancer  Depending on your age and overall health, your health care provider may do certain tests to  screen for prostate and testicular cancer.  Talk to your health care provider about any symptoms or concerns you have about testicular or prostate cancer.  Skin Cancer  Check your skin from head to toe regularly.  Tell your health care provider about any new moles or changes in moles, especially if: ? There is a change in a mole's size, shape, or color. ? You have a mole that is larger than a pencil eraser.  Always use sunscreen. Apply sunscreen liberally and repeat throughout the day.  Protect yourself by wearing long sleeves, pants, a wide-brimmed hat,  and sunglasses when outside.  What should I know about heart disease, diabetes, and high blood pressure?  If you are 37-59 years of age, have your blood pressure checked every 3-5 years. If you are 65 years of age or older, have your blood pressure checked every year. You should have your blood pressure measured twice-once when you are at a hospital or clinic, and once when you are not at a hospital or clinic. Record the average of the two measurements. To check your blood pressure when you are not at a hospital or clinic, you can use: ? An automated blood pressure machine at a pharmacy. ? A home blood pressure monitor.  Talk to your health care provider about your target blood pressure.  If you are between 51-15 years old, ask your health care provider if you should take aspirin to prevent heart disease.  Have regular diabetes screenings by checking your fasting blood sugar level. ? If you are at a normal weight and have a low risk for diabetes, have this test once every three years after the age of 36. ? If you are overweight and have a high risk for diabetes, consider being tested at a younger age or more often.  A one-time screening for abdominal aortic aneurysm (AAA) by ultrasound is recommended for men aged 80-75 years who are current or former smokers. What should I know about preventing infection? Hepatitis B If you have a higher risk for hepatitis B, you should be screened for this virus. Talk with your health care provider to find out if you are at risk for hepatitis B infection. Hepatitis C Blood testing is recommended for:  Everyone born from [birth year] through 1965.  Anyone with known risk factors for hepatitis C.  Sexually Transmitted Diseases (STDs)  You should be screened each year for STDs including gonorrhea and chlamydia if: ? You are sexually active and are younger than 77 years of age. ? You are older than 76 years of age and your health care provider tells you that you  are at risk for this type of infection. ? Your sexual activity has changed since you were last screened and you are at an increased risk for chlamydia or gonorrhea. Ask your health care provider if you are at risk.  Talk with your health care provider about whether you are at high risk of being infected with HIV. Your health care provider may recommend a prescription medicine to help prevent HIV infection.  What else can I do?  Schedule regular health, dental, and eye exams.  Stay current with your vaccines (immunizations).  Do not use any tobacco products, such as cigarettes, chewing tobacco, and e-cigarettes. If you need help quitting, ask your health care provider.  Limit alcohol intake to no more than 2 drinks per day. One drink equals 12 ounces of beer, 5 ounces of Jakari Sada, or 1 ounces of hard liquor.  Do not use street drugs.  Do not share needles.  Ask your health care provider for help if you need support or information about quitting drugs.  Tell your health care provider if you often feel depressed.  Tell your health care provider if you have ever been abused or do not feel safe at home. This information is not intended to replace advice given to you by your health care provider. Make sure you discuss any questions you have with your health care provider. Document Released: 01/31/2008 Document Revised: 04/02/2016 Document Reviewed: 05/08/2015 Elsevier Interactive Patient Education  [employer].

## 2018-05-22 DIAGNOSIS — R69 Illness, unspecified: Secondary | ICD-10-CM | POA: Diagnosis not present

## 2018-07-06 DIAGNOSIS — H43813 Vitreous degeneration, bilateral: Secondary | ICD-10-CM | POA: Diagnosis not present

## 2018-07-06 DIAGNOSIS — H04123 Dry eye syndrome of bilateral lacrimal glands: Secondary | ICD-10-CM | POA: Diagnosis not present

## 2018-07-06 DIAGNOSIS — H40023 Open angle with borderline findings, high risk, bilateral: Secondary | ICD-10-CM | POA: Diagnosis not present

## 2018-08-26 ENCOUNTER — Other Ambulatory Visit: Payer: Self-pay | Admitting: Internal Medicine

## 2018-09-03 DIAGNOSIS — Z0100 Encounter for examination of eyes and vision without abnormal findings: Secondary | ICD-10-CM | POA: Diagnosis not present

## 2018-10-01 ENCOUNTER — Emergency Department (HOSPITAL_COMMUNITY): Payer: Medicare HMO

## 2018-10-01 ENCOUNTER — Encounter (HOSPITAL_COMMUNITY): Payer: Self-pay | Admitting: Emergency Medicine

## 2018-10-01 ENCOUNTER — Other Ambulatory Visit: Payer: Self-pay

## 2018-10-01 ENCOUNTER — Emergency Department (HOSPITAL_COMMUNITY)
Admission: EM | Admit: 2018-10-01 | Discharge: 2018-10-01 | Disposition: A | Discharge status: Home / Self Care | Payer: Medicare HMO | Attending: Emergency Medicine | Admitting: Emergency Medicine

## 2018-10-01 DIAGNOSIS — Y9389 Activity, other specified: Secondary | ICD-10-CM | POA: Diagnosis not present

## 2018-10-01 DIAGNOSIS — I130 Hypertensive heart and chronic kidney disease with heart failure and stage 1 through stage 4 chronic kidney disease, or unspecified chronic kidney disease: Secondary | ICD-10-CM | POA: Diagnosis not present

## 2018-10-01 DIAGNOSIS — S46912A Strain of unspecified muscle, fascia and tendon at shoulder and upper arm level, left arm, initial encounter: Secondary | ICD-10-CM | POA: Diagnosis not present

## 2018-10-01 DIAGNOSIS — J449 Chronic obstructive pulmonary disease, unspecified: Secondary | ICD-10-CM | POA: Diagnosis not present

## 2018-10-01 DIAGNOSIS — Y92410 Unspecified street and highway as the place of occurrence of the external cause: Secondary | ICD-10-CM | POA: Diagnosis not present

## 2018-10-01 DIAGNOSIS — Z7982 Long term (current) use of aspirin: Secondary | ICD-10-CM | POA: Insufficient documentation

## 2018-10-01 DIAGNOSIS — M25512 Pain in left shoulder: Secondary | ICD-10-CM | POA: Diagnosis not present

## 2018-10-01 DIAGNOSIS — Y999 Unspecified external cause status: Secondary | ICD-10-CM | POA: Insufficient documentation

## 2018-10-01 DIAGNOSIS — I5042 Chronic combined systolic (congestive) and diastolic (congestive) heart failure: Secondary | ICD-10-CM | POA: Insufficient documentation

## 2018-10-01 DIAGNOSIS — I2510 Atherosclerotic heart disease of native coronary artery without angina pectoris: Secondary | ICD-10-CM | POA: Insufficient documentation

## 2018-10-01 DIAGNOSIS — N189 Chronic kidney disease, unspecified: Secondary | ICD-10-CM | POA: Diagnosis not present

## 2018-10-01 DIAGNOSIS — Z87891 Personal history of nicotine dependence: Secondary | ICD-10-CM | POA: Diagnosis not present

## 2018-10-01 DIAGNOSIS — Z79899 Other long term (current) drug therapy: Secondary | ICD-10-CM | POA: Insufficient documentation

## 2018-10-01 DIAGNOSIS — S4992XA Unspecified injury of left shoulder and upper arm, initial encounter: Secondary | ICD-10-CM | POA: Diagnosis not present

## 2018-10-01 NOTE — Discharge Instructions (Signed)
You can use tylenol for pain and muscle rubs and heating pad for the shoulder

## 2018-10-01 NOTE — ED Notes (Signed)
Bed: WA04 Expected date:  Expected time:  Means of arrival:  Comments: 
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
## 2018-10-05 ENCOUNTER — Encounter: Payer: Self-pay | Admitting: Internal Medicine

## 2018-10-05 ENCOUNTER — Other Ambulatory Visit (INDEPENDENT_AMBULATORY_CARE_PROVIDER_SITE_OTHER): Payer: Medicare HMO

## 2018-10-05 ENCOUNTER — Ambulatory Visit (INDEPENDENT_AMBULATORY_CARE_PROVIDER_SITE_OTHER): Payer: Medicare HMO | Admitting: Internal Medicine

## 2018-10-05 VITALS — BP 120/78 | HR 60 | Temp 98.1°F | Ht 71.0 in | Wt 160.0 lb

## 2018-10-05 DIAGNOSIS — K5090 Crohn's disease, unspecified, without complications: Secondary | ICD-10-CM | POA: Diagnosis not present

## 2018-10-05 DIAGNOSIS — R972 Elevated prostate specific antigen [PSA]: Secondary | ICD-10-CM | POA: Diagnosis not present

## 2018-10-05 DIAGNOSIS — I10 Essential (primary) hypertension: Secondary | ICD-10-CM | POA: Diagnosis not present

## 2018-10-05 DIAGNOSIS — E785 Hyperlipidemia, unspecified: Secondary | ICD-10-CM

## 2018-10-05 DIAGNOSIS — N183 Chronic kidney disease, stage 3 unspecified: Secondary | ICD-10-CM

## 2018-10-05 DIAGNOSIS — R7302 Impaired glucose tolerance (oral): Secondary | ICD-10-CM

## 2018-10-05 DIAGNOSIS — M25512 Pain in left shoulder: Secondary | ICD-10-CM

## 2018-10-05 DIAGNOSIS — M25519 Pain in unspecified shoulder: Secondary | ICD-10-CM | POA: Insufficient documentation

## 2018-10-05 LAB — HEPATIC FUNCTION PANEL
ALT: 19 U/L (ref 0–53)
AST: 25 U/L (ref 0–37)
Albumin: 4.3 g/dL (ref 3.5–5.2)
Alkaline Phosphatase: 66 U/L (ref 39–117)
BILIRUBIN TOTAL: 1 mg/dL (ref 0.2–1.2)
Bilirubin, Direct: 0.1 mg/dL (ref 0.0–0.3)
Total Protein: 8.3 g/dL (ref 6.0–8.3)

## 2018-10-05 LAB — CBC WITH DIFFERENTIAL/PLATELET
BASOS ABS: 0.1 10*3/uL (ref 0.0–0.1)
BASOS PCT: 2.7 % (ref 0.0–3.0)
Eosinophils Absolute: 0.2 10*3/uL (ref 0.0–0.7)
Eosinophils Relative: 4.7 % (ref 0.0–5.0)
HCT: 40.5 % (ref 39.0–52.0)
Hemoglobin: 13.4 g/dL (ref 13.0–17.0)
LYMPHS PCT: 25.3 % (ref 12.0–46.0)
Lymphs Abs: 1.1 10*3/uL (ref 0.7–4.0)
MCHC: 33.2 g/dL (ref 30.0–36.0)
MCV: 85.9 fl (ref 78.0–100.0)
MONO ABS: 0.7 10*3/uL (ref 0.1–1.0)
Monocytes Relative: 15 % — ABNORMAL HIGH (ref 3.0–12.0)
NEUTROS ABS: 2.4 10*3/uL (ref 1.4–7.7)
NEUTROS PCT: 52.3 % (ref 43.0–77.0)
PLATELETS: 223 10*3/uL (ref 150.0–400.0)
RBC: 4.71 Mil/uL (ref 4.22–5.81)
RDW: 13.8 % (ref 11.5–15.5)
WBC: 4.6 10*3/uL (ref 4.0–10.5)

## 2018-10-05 LAB — BASIC METABOLIC PANEL
BUN: 18 mg/dL (ref 6–23)
CALCIUM: 10.3 mg/dL (ref 8.4–10.5)
CO2: 28 meq/L (ref 19–32)
Chloride: 103 mEq/L (ref 96–112)
Creatinine, Ser: 1.64 mg/dL — ABNORMAL HIGH (ref 0.40–1.50)
GFR: 49.41 mL/min — ABNORMAL LOW (ref >60.00)
GLUCOSE: 79 mg/dL (ref 70–99)
POTASSIUM: 4.7 meq/L (ref 3.5–5.1)
Sodium: 137 mEq/L (ref 135–145)

## 2018-10-05 LAB — LIPID PANEL
Cholesterol: 131 mg/dL (ref 0–200)
HDL: 46.1 mg/dL (ref >39.00)
LDL Cholesterol: 74 mg/dL (ref 0–99)
NonHDL: 84.97
Total CHOL/HDL Ratio: 3
Triglycerides: 56 mg/dL (ref 0.0–149.0)
VLDL: 11.2 mg/dL (ref 0.0–40.0)

## 2018-10-05 LAB — HEMOGLOBIN A1C: Hgb A1c MFr Bld: 5.7 % (ref 4.6–6.5)

## 2018-10-05 LAB — URINALYSIS, ROUTINE W REFLEX MICROSCOPIC
Bilirubin Urine: NEGATIVE
HGB URINE DIPSTICK: NEGATIVE
KETONES UR: NEGATIVE
LEUKOCYTE UA: NEGATIVE
Nitrite: NEGATIVE
PH: 5.5 (ref 5.0–8.0)
RBC / HPF: NONE SEEN (ref 0–?)
Specific Gravity, Urine: 1.01 (ref 1.000–1.030)
TOTAL PROTEIN, URINE-UPE24: NEGATIVE
Urine Glucose: NEGATIVE
Urobilinogen, UA: 0.2 (ref 0.0–1.0)
WBC UA: NONE SEEN (ref 0–?)

## 2018-10-05 LAB — PSA: PSA: 6.74 ng/mL — ABNORMAL HIGH (ref 0.10–4.00)

## 2018-10-05 LAB — TSH: TSH: 4.78 u[IU]/mL — ABNORMAL HIGH (ref 0.35–4.50)

## 2018-10-05 NOTE — Assessment & Plan Note (Signed)
stable overall by history and exam, recent data reviewed with pt, and pt to continue medical treatment as before,  to f/u any worsening symptoms or concerns  

## 2018-10-05 NOTE — Assessment & Plan Note (Signed)
Mild to mod, declines volt gel prn, consider sport med for cortisone
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
## 2018-10-05 NOTE — Patient Instructions (Signed)
Please continue all other medications as before, and refills have been done if requested.  Please have the pharmacy call with any other refills you may need.  Please continue your efforts at being more active, low cholesterol diet  Please keep your appointments with your specialists as you may have planned  Please go to the LAB in the Basement (turn left off the elevator) for the tests to be done today  You will be contacted by phone if any changes need to be made immediately.  Otherwise, you will receive a letter about your results with an explanation, but please check with MyChart first.  Please remember to sign up for MyChart if you have not done so, as this will be important to you in the future with finding out test results, communicating by private email, and scheduling acute appointments online when needed.  Please return in 6 months, or sooner if needed, with Lab testing done 3-5 days before  OK to cancel appt for next week

## 2018-10-05 NOTE — Assessment & Plan Note (Signed)
Asympt, for f/u psa today with labs

## 2018-10-05 NOTE — Assessment & Plan Note (Signed)
stable overall by history and exam, recent data reviewed with pt, and pt to continue medical treatment as before,  to f/u any worsening symptoms or concerns, for f/u lab 

## 2018-10-15 ENCOUNTER — Ambulatory Visit: Payer: Medicare HMO | Admitting: Internal Medicine

## 2018-11-17 ENCOUNTER — Telehealth: Payer: Self-pay

## 2018-11-17 MED ORDER — BENZONATATE 100 MG PO CAPS: 200.0000 mg | ORAL_CAPSULE | Freq: Three times a day (TID) | ORAL | 0 refills | Status: AC | PRN

## 2018-11-17 MED ORDER — TRIAMCINOLONE ACETONIDE 55 MCG/ACT NA AERO: 2.0000 | INHALATION_SPRAY | Freq: Every day | NASAL | 12 refills | Status: AC

## 2018-11-17 NOTE — Addendum Note (Signed)
Addended by: Biagio Borg on: 11/17/2018 02:59 PM   Modules accepted: Orders

## 2018-11-17 NOTE — Telephone Encounter (Signed)
Copied from [place] [id number]. Topic: Appointment Scheduling - Scheduling Inquiry for Clinic >> Nov 17, 2018  2:07 PM Percell Belt A wrote: Reason for CRM: pt wife called in and left a VM on the General box- she stated that pt has a bad cough .  She did not leave any other details but he needs to be seen   Best number  [phone] >> Nov 17, 2018  2:18 PM Morey Hummingbird wrote: Please advise If patient can come in for appt

## 2018-11-17 NOTE — Telephone Encounter (Signed)
Patient cannot do virtual, he started Zyrtec yesterday, and is doing a humidifier at night. His cough mostly bothers him at night. He does feel better since doing the Zyrtec but would like to know what else to do.

## 2018-11-17 NOTE — Telephone Encounter (Signed)
Pt has been informed and expressed understanding.  

## 2018-11-17 NOTE — Telephone Encounter (Signed)
Ok to add Nasacort asd, as well as tessalon perle for cough - done erx

## 2018-11-24 ENCOUNTER — Telehealth: Payer: Self-pay

## 2018-11-24 NOTE — Telephone Encounter (Signed)
Doximity video visit, wife should be able to help him.      Virtual Visit Pre-Appointment Phone Call  Steps For Call:  1. Confirm consent - "In the setting of the current Covid19 crisis, you are scheduled for a (phone or video) visit with your provider on (date) at (time).  Just as we do with many in-office visits, in order for you to participate in this visit, we must obtain consent.  If you'd like, I can send this to your mychart (if signed up) or email for you to review.  Otherwise, I can obtain your verbal consent now.  All virtual visits are billed to your insurance company just like a normal visit would be.  By agreeing to a virtual visit, we'd like you to understand that the technology does not allow for your provider to perform an examination, and thus may limit your provider's ability to fully assess your condition.  Finally, though the technology is pretty good, we cannot assure that it will always work on either your or our end, and in the setting of a video visit, we may have to convert it to a phone-only visit.  In either situation, we cannot ensure that we have a secure connection.  Are you willing to proceed?"  2. Give patient instructions for WebEx download to smartphone as below if video visit  3. Advise patient to be prepared with any vital sign or heart rhythm information, their current medicines, and a piece of paper and pen handy for any instructions they may receive the day of their visit  4. Inform patient they will receive a phone call 15 minutes prior to their appointment time (may be from unknown caller ID) so they should be prepared to answer  5. Confirm that appointment type is correct in Epic appointment notes (video vs telephone)    TELEPHONE CALL NOTE  Gary Levy has been deemed a candidate for a follow-up tele-health visit to limit community exposure during the Covid-19 pandemic. I spoke with the patient via phone to ensure availability of phone/video  source, confirm preferred email & phone number, and discuss instructions and expectations.  I reminded Adrienne Delay to be prepared with any vital sign and/or heart rhythm information that could potentially be obtained via home monitoring, at the time of his visit. I reminded Oran Dillenburg to expect a phone call at the time of his visit if his visit.  Did the patient verbally acknowledge consent to treatment? yes  Gar Ponto, CMA 11/24/2018 2:55 PM   DOWNLOADING THE [place], go to [employer] and type in WebEx in the search bar. [place] [employer], the blue/green circle. The app is free but as with any other app downloads, their phone may require them to verify saved payment information or Apple password. The patient does NOT have to create an account.  - If Android, ask patient to go to [employer] and type in WebEx in the search bar. [place] [employer], the blue/green circle. The app is free but as with any other app downloads, their phone may require them to verify saved payment information or Android password. The patient does NOT have to create an account.   CONSENT FOR TELE-HEALTH VISIT - PLEASE REVIEW  I hereby voluntarily request, consent and authorize [facility] and its employed or contracted physicians, physician assistants, nurse practitioners or other licensed health care professionals (the Practitioner), to provide me with telemedicine health care  services (the "Services") as deemed necessary by the treating Practitioner. I acknowledge and consent to receive the Services by the Practitioner via telemedicine. I understand that the telemedicine visit will involve communicating with the Practitioner through live audiovisual communication technology and the disclosure of certain medical information by electronic transmission. I acknowledge that I have been given the opportunity to request an in-person assessment or other  available alternative prior to the telemedicine visit and am voluntarily participating in the telemedicine visit.  I understand that I have the right to withhold or withdraw my consent to the use of telemedicine in the course of my care at any time, without affecting my right to future care or treatment, and that the Practitioner or I may terminate the telemedicine visit at any time. I understand that I have the right to inspect all information obtained and/or recorded in the course of the telemedicine visit and may receive copies of available information for a reasonable fee.  I understand that some of the potential risks of receiving the Services via telemedicine include:  Marland Kitchen Delay or interruption in medical evaluation due to technological equipment failure or disruption; . Information transmitted may not be sufficient (e.g. poor resolution of images) to allow for appropriate medical decision making by the Practitioner; and/or  . In rare instances, security protocols could fail, causing a breach of personal health information.  Furthermore, I acknowledge that it is my responsibility to provide information about my medical history, conditions and care that is complete and accurate to the best of my ability. I acknowledge that Practitioner's advice, recommendations, and/or decision may be based on factors not within their control, such as incomplete or inaccurate data provided by me or distortions of diagnostic images or specimens that may result from electronic transmissions. I understand that the practice of medicine is not an exact science and that Practitioner makes no warranties or guarantees regarding treatment outcomes. I acknowledge that I will receive a copy of this consent concurrently upon execution via email to the email address I last provided but may also request a printed copy by calling the office of [place].    I understand that my insurance will be billed for this visit.   I have  read or had this consent read to me. . I understand the contents of this consent, which adequately explains the benefits and risks of the Services being provided via telemedicine.  . I have been provided ample opportunity to ask questions regarding this consent and the Services and have had my questions answered to my satisfaction. . I give my informed consent for the services to be provided through the use of telemedicine in my medical care  By participating in this telemedicine visit I agree to the above.

## 2018-11-29 DIAGNOSIS — I255 Ischemic cardiomyopathy: Secondary | ICD-10-CM | POA: Insufficient documentation

## 2018-11-29 DIAGNOSIS — I2510 Atherosclerotic heart disease of native coronary artery without angina pectoris: Secondary | ICD-10-CM | POA: Insufficient documentation

## 2018-11-29 NOTE — Progress Notes (Signed)
Virtual Visit via Video Note   This visit type was conducted due to national recommendations for restrictions regarding the COVID-19 Pandemic (e.g. social distancing) in an effort to limit this patient's exposure and mitigate transmission in our community.  Due to his co-morbid illnesses, this patient is at least at moderate risk for complications without adequate follow up.  This format is felt to be most appropriate for this patient at this time.  All issues noted in this document were discussed and addressed.  A limited physical exam was performed with this format.  Please refer to the patient's chart for his consent to telehealth for [facility].   Evaluation Performed:  Follow-up visit  Date:  11/30/2018   ID:  Gary Levy, DOB 1941/06/09, MRN [id number]  Patient Location: Home  Provider Location: Home  PCP:  Biagio Borg, MD  Cardiologist:  Lauree Chandler, MD  Electrophysiologist:  None   Chief Complaint:  Follow up  History of Present Illness:    Gary Levy is a 78 y.o. male who presents via audio/video conferencing for a telehealth visit today.    Patient with history of CAD S/P CABG x 4 1995, last cath 1999 showed failure of the LIMA graft to the LAD and the SVG to the RCA. There was high grade disease in the SVG to the OM and the SVG to the Diagonal. It appears that the LAD was treated with rotablator atherectomy and stenting in 1999. The SVG to the OM and the SVG to the Diagonal were treated with angioplasty. Stress myoview 08/17/13 Intermediate risk study with a fixed large, severe inferolateral, basal to mid inferior, and mid to apical anterior perfusion defect. This suggested prior infarction with no significant ischemia. Last echo December 2014 with LVEF=35%. ICM, HTN, HLD, COPD, CKD-not on ACEI/ARB.  Last saw Dr. Angelena Form 11/30/17 and doing well without angina.Echo showed improved LVEF 40-45% with mild MR.  Patient was in [facility] 10/01/18 and went to ED with  left should pain and soreness.  Denies chest pain, shortness of breath, dizziness, edema, presyncope. Stays active taking care of his grandchildren ages 17 & 6 and walking around his yard. His allergies have been acting up.    The patient does not have symptoms concerning for COVID-19 infection (fever, chills, cough, or new shortness of breath).    Past Medical History:  Diagnosis Date  . Alcohol abuse    hx  . Anemia, iron deficiency   . AR (allergic rhinitis)   . BPH (benign prostatic hypertrophy)   . CAD (coronary artery disease) of artery bypass graft   . CHF (congestive heart failure) ([place])   . CKD (chronic kidney disease) 09/30/2011  . COPD (chronic obstructive pulmonary disease) ([place])   . Crohn's disease ([place])   . Dysuria   . ED (erectile dysfunction) of organic origin   . Fatigue   . Glaucoma   . Glucose intolerance (impaired glucose tolerance)   . HLD (hyperlipidemia)   . HTN (hypertension)   . Impaired glucose tolerance 09/27/2011  . MI (myocardial infarction) ([place])    hx  . Moderate or severe vision impairment, both eyes, impairment level not further specified   . Postherpetic neuralgia   . PSA elevation   . Routine general medical examination at a health care facility   . Shingles   . Skin cancer    hx; non-melanoma  . Special screening for malignant neoplasm of prostate   . Urinary frequency    Past Surgical History:  Procedure Laterality Date  . CORONARY ANGIOPLASTY WITH STENT PLACEMENT    . CORONARY ARTERY BYPASS GRAFT     11+ years ago; has an EF of about 45%, last Myoview showed no ischemia 12/30/06  . [facility]  2011   Dr. Rush Farmer   . right cataract  12/11   with lens implant      Current Meds  Medication Sig  . allopurinol (ZYLOPRIM) 100 MG tablet Take 1 tablet (100 mg total) by mouth daily.  Marland Kitchen aspirin 81 MG EC tablet Take 81 mg by mouth daily.    Marland Kitchen atorvastatin (LIPITOR) 80 MG tablet Take 1 tablet (80 mg total) by mouth daily at 6 PM.  . benzonatate  (TESSALON PERLES) 100 MG capsule Take 2 capsules (200 mg total) by mouth 3 (three) times daily as needed for cough.  . bismuth-metronidazole-tetracycline (PYLERA) 140-125-125 MG per capsule Take 3 capsules by mouth 4 (four) times daily -  before meals and at bedtime.  . carvedilol (COREG) 6.25 MG tablet TAKE 1 TABLET TWICE DAILY WITH MEALS  . cetirizine (ZYRTEC) 10 MG tablet Take 1 tablet (10 mg total) by mouth daily.  . colchicine 0.6 MG tablet Take 1 tablet (0.6 mg total) by mouth daily.  Marland Kitchen HYDROcodone-acetaminophen (NORCO/VICODIN) 5-325 MG tablet Take 1-2 tablets by mouth every 4 (four) hours as needed.  . Multiple Vitamins-Minerals (CENTRUM PO) Take 1 tablet by mouth daily.    . nitroGLYCERIN (NITROSTAT) 0.4 MG SL tablet Place 0.4 mg under the tongue every 5 (five) minutes as needed for chest pain (Call 911 at 3rd dose within 15 minutes).  Marland Kitchen spironolactone (ALDACTONE) 25 MG tablet Take 1 tablet (25 mg total) by mouth daily.  Marland Kitchen triamcinolone (NASACORT) 55 MCG/ACT AERO nasal inhaler Place 2 sprays into the nose daily.  . VESICARE 5 MG tablet TAKE 1 TABLET EVERY DAY     Allergies:   Patient has no known allergies.   Social History   Tobacco Use  . Smoking status: Former Smoker    Last attempt to quit: 03/20/1981    Years since quitting: 37.7  . Smokeless tobacco: Never Used  Substance Use Topics  . Alcohol use: No  . Drug use: No     Family Hx: The patient's family history includes Heart disease in his mother.  ROS:   Please see the history of present illness.    Review of Systems  Constitution: Negative.  HENT: Negative.        Seasonal allergies  Cardiovascular: Negative.   Respiratory: Negative.   Endocrine: Negative.   Hematologic/Lymphatic: Negative.   Musculoskeletal: Negative.   Gastrointestinal: Negative.   Genitourinary: Negative.   Neurological: Negative.    All other systems reviewed and are negative.   Prior CV studies:   The following studies were reviewed  today:  Echo 4/2019Study Conclusions   - Left ventricle: Septal , apical and distal anterior wall   hypokinesis. The cavity size was mildly dilated. Wall thickness   was increased in a pattern of mild LVH. Systolic function was   mildly to moderately reduced. The estimated ejection fraction was   in the range of 40% to 45%. Wall motion was normal; there were no   regional wall motion abnormalities. Left ventricular diastolic   function parameters were normal. - Aortic valve: Calcified non coronary cusp. - Mitral valve: There was mild regurgitation. - Atrial septum: No defect or patent foramen ovale was identified. - Pulmonary arteries: PA peak pressure: 33 mm Hg (S).  Labs/Other Tests and Data Reviewed:    EKG:  An ECG dated 11/30/17 was personally reviewed today and demonstrated:  sinus bradycardia at 52/m inflat infarct, ST T wave changes.  Recent Labs: 10/05/2018: ALT 19; BUN 18; Creatinine, Ser 1.64; Hemoglobin 13.4; Platelets 223.0; Potassium 4.7; Sodium 137; TSH 4.78   Recent Lipid Panel Lab Results  Component Value Date/Time   CHOL 131 10/05/2018 04:08 PM   TRIG 56.0 10/05/2018 04:08 PM   HDL 46.10 10/05/2018 04:08 PM   CHOLHDL 3 10/05/2018 04:08 PM   LDLCALC 74 10/05/2018 04:08 PM    Wt Readings from Last 3 Encounters:  11/30/18 166 lb (75.3 kg)  10/05/18 160 lb (72.6 kg)  10/01/18 166 lb (75.3 kg)     Objective:    Vital Signs:  BP 122/83   Pulse 87   Ht 5\' 11"  (1.803 m)   Wt 166 lb (75.3 kg)   BMI 23.15 kg/m    Well nourished, well developed male in no  acute distress. No JVD or edema  ASSESSMENT & PLAN:    CAD S/P CABG 1995, last cath 1999- shows failure of the LIMA graft to the LAD and the SVG to the RCA. There was high grade disease in the SVG to the OM and the SVG to the Diagonal. It appears that the LAD was treated with rotablator atherectomy and stenting in 1999. The SVG to the OM and the SVG to the Diagonal were treated with angioplasty. No  Angina.Continue ASA, statin, coreg. F/u with Dr. Angelena Form in 1 yr.   Ischemic cardiomyopathy echo 11/2017 LVEF improved to 40-45% no CHF  Chronic systolic CHF on aldactone-doesn't need refills, no CHF signs or symptoms  Essential Hypertension BP well controlled  Hyperlipidemia lipids 10/05/18 LDL 74, trig 56 on lipitor 80 mg daily  CKD stage 3 Crt 1.6 09/2018   COVID-19 Education: The signs and symptoms of COVID-19 were discussed with the patient and how to seek care for testing (follow up with PCP or arrange E-visit).  The importance of social distancing was discussed today.  Time:   Today, I have spent 15 minutes with the patient with telehealth technology discussing the above problems.     Medication Adjustments/Labs and Tests Ordered: Current medicines are reviewed at length with the patient today.  Concerns regarding medicines are outlined above.  Tests Ordered: No orders of the defined types were placed in this encounter.  Medication Changes: No orders of the defined types were placed in this encounter.   Disposition:  Follow up in 1 year(s) Dr. Angelena Form  Signed, Ermalinda Barrios, PA-C  11/30/2018 10:30 AM    [place]

## 2018-11-30 ENCOUNTER — Telehealth (INDEPENDENT_AMBULATORY_CARE_PROVIDER_SITE_OTHER): Payer: Medicare HMO | Admitting: Physician Assistant

## 2018-11-30 ENCOUNTER — Encounter: Payer: Self-pay | Admitting: Physician Assistant

## 2018-11-30 ENCOUNTER — Other Ambulatory Visit: Payer: Self-pay

## 2018-11-30 VITALS — BP 122/83 | HR 87 | Ht 71.0 in | Wt 166.0 lb

## 2018-11-30 DIAGNOSIS — N183 Chronic kidney disease, stage 3 unspecified: Secondary | ICD-10-CM

## 2018-11-30 DIAGNOSIS — I25810 Atherosclerosis of coronary artery bypass graft(s) without angina pectoris: Secondary | ICD-10-CM

## 2018-11-30 DIAGNOSIS — I10 Essential (primary) hypertension: Secondary | ICD-10-CM | POA: Diagnosis not present

## 2018-11-30 DIAGNOSIS — E785 Hyperlipidemia, unspecified: Secondary | ICD-10-CM

## 2018-11-30 DIAGNOSIS — I255 Ischemic cardiomyopathy: Secondary | ICD-10-CM

## 2019-01-07 ENCOUNTER — Other Ambulatory Visit: Payer: Self-pay | Admitting: Internal Medicine

## 2019-01-10 ENCOUNTER — Other Ambulatory Visit: Payer: Self-pay | Admitting: Cardiovascular Disease

## 2019-01-28 ENCOUNTER — Other Ambulatory Visit: Payer: Self-pay | Admitting: Cardiovascular Disease

## 2019-04-15 ENCOUNTER — Other Ambulatory Visit: Payer: Self-pay

## 2019-04-15 ENCOUNTER — Encounter: Payer: Self-pay | Admitting: Internal Medicine

## 2019-04-15 ENCOUNTER — Other Ambulatory Visit (INDEPENDENT_AMBULATORY_CARE_PROVIDER_SITE_OTHER): Payer: Medicare HMO

## 2019-04-15 ENCOUNTER — Ambulatory Visit (INDEPENDENT_AMBULATORY_CARE_PROVIDER_SITE_OTHER): Payer: Medicare HMO | Admitting: Internal Medicine

## 2019-04-15 VITALS — BP 142/86 | HR 66 | Temp 97.6°F | Ht 71.0 in | Wt 162.0 lb

## 2019-04-15 DIAGNOSIS — E559 Vitamin D deficiency, unspecified: Secondary | ICD-10-CM | POA: Diagnosis not present

## 2019-04-15 DIAGNOSIS — E538 Deficiency of other specified B group vitamins: Secondary | ICD-10-CM

## 2019-04-15 DIAGNOSIS — Z0000 Encounter for general adult medical examination without abnormal findings: Secondary | ICD-10-CM

## 2019-04-15 DIAGNOSIS — R7302 Impaired glucose tolerance (oral): Secondary | ICD-10-CM

## 2019-04-15 DIAGNOSIS — D509 Iron deficiency anemia, unspecified: Secondary | ICD-10-CM

## 2019-04-15 DIAGNOSIS — R972 Elevated prostate specific antigen [PSA]: Secondary | ICD-10-CM

## 2019-04-15 DIAGNOSIS — Z23 Encounter for immunization: Secondary | ICD-10-CM

## 2019-04-15 DIAGNOSIS — I10 Essential (primary) hypertension: Secondary | ICD-10-CM | POA: Diagnosis not present

## 2019-04-15 DIAGNOSIS — N183 Chronic kidney disease, stage 3 unspecified: Secondary | ICD-10-CM

## 2019-04-15 LAB — CBC WITH DIFFERENTIAL/PLATELET
Basophils Absolute: 0.1 10*3/uL (ref 0.0–0.1)
Basophils Relative: 1.8 % (ref 0.0–3.0)
Eosinophils Absolute: 0.2 10*3/uL (ref 0.0–0.7)
Eosinophils Relative: 5.6 % — ABNORMAL HIGH (ref 0.0–5.0)
HCT: 37.7 % — ABNORMAL LOW (ref 39.0–52.0)
Hemoglobin: 12.4 g/dL — ABNORMAL LOW (ref 13.0–17.0)
Lymphocytes Relative: 30.3 % (ref 12.0–46.0)
Lymphs Abs: 1 10*3/uL (ref 0.7–4.0)
MCHC: 32.8 g/dL (ref 30.0–36.0)
MCV: 87.5 fl (ref 78.0–100.0)
Monocytes Absolute: 0.5 10*3/uL (ref 0.1–1.0)
Monocytes Relative: 15.1 % — ABNORMAL HIGH (ref 3.0–12.0)
Neutro Abs: 1.5 10*3/uL (ref 1.4–7.7)
Neutrophils Relative %: 47.2 % (ref 43.0–77.0)
Platelets: 191 10*3/uL (ref 150.0–400.0)
RBC: 4.31 Mil/uL (ref 4.22–5.81)
RDW: 14.2 % (ref 11.5–15.5)
WBC: 3.2 10*3/uL — ABNORMAL LOW (ref 4.0–10.5)

## 2019-04-15 LAB — URINALYSIS, ROUTINE W REFLEX MICROSCOPIC
Bilirubin Urine: NEGATIVE
Hgb urine dipstick: NEGATIVE
Ketones, ur: NEGATIVE
Leukocytes,Ua: NEGATIVE
Nitrite: NEGATIVE
RBC / HPF: NONE SEEN (ref 0–?)
Specific Gravity, Urine: 1.02 (ref 1.000–1.030)
Total Protein, Urine: NEGATIVE
Urine Glucose: NEGATIVE
Urobilinogen, UA: 0.2 (ref 0.0–1.0)
WBC, UA: NONE SEEN (ref 0–?)
pH: 6.5 (ref 5.0–8.0)

## 2019-04-15 LAB — BASIC METABOLIC PANEL
BUN: 20 mg/dL (ref 6–23)
CO2: 28 mEq/L (ref 19–32)
Calcium: 9.4 mg/dL (ref 8.4–10.5)
Chloride: 103 mEq/L (ref 96–112)
Creatinine, Ser: 1.48 mg/dL (ref 0.40–1.50)
GFR: 55.55 mL/min — ABNORMAL LOW (ref >60.00)
Glucose, Bld: 97 mg/dL (ref 70–99)
Potassium: 4.2 mEq/L (ref 3.5–5.1)
Sodium: 138 mEq/L (ref 135–145)

## 2019-04-15 LAB — HEPATIC FUNCTION PANEL
ALT: 22 U/L (ref 0–53)
AST: 27 U/L (ref 0–37)
Albumin: 4 g/dL (ref 3.5–5.2)
Alkaline Phosphatase: 58 U/L (ref 39–117)
Bilirubin, Direct: 0.2 mg/dL (ref 0.0–0.3)
Total Bilirubin: 0.9 mg/dL (ref 0.2–1.2)
Total Protein: 7.2 g/dL (ref 6.0–8.3)

## 2019-04-15 LAB — VITAMIN D 25 HYDROXY (VIT D DEFICIENCY, FRACTURES): VITD: 34.44 ng/mL (ref 30.00–100.00)

## 2019-04-15 LAB — IBC PANEL
Iron: 51 ug/dL (ref 42–165)
Saturation Ratios: 15.6 % — ABNORMAL LOW (ref 20.0–50.0)
Transferrin: 234 mg/dL (ref 212.0–360.0)

## 2019-04-15 LAB — LIPID PANEL
Cholesterol: 125 mg/dL (ref 0–200)
HDL: 44.2 mg/dL (ref >39.00)
LDL Cholesterol: 69 mg/dL (ref 0–99)
NonHDL: 80.87
Total CHOL/HDL Ratio: 3
Triglycerides: 61 mg/dL (ref 0.0–149.0)
VLDL: 12.2 mg/dL (ref 0.0–40.0)

## 2019-04-15 LAB — PSA: PSA: 6 ng/mL — ABNORMAL HIGH (ref 0.10–4.00)

## 2019-04-15 LAB — TSH: TSH: 3.22 u[IU]/mL (ref 0.35–4.50)

## 2019-04-15 LAB — HEMOGLOBIN A1C: Hgb A1c MFr Bld: 5.7 % (ref 4.6–6.5)

## 2019-04-15 LAB — VITAMIN B12: Vitamin B-12: 695 pg/mL (ref 211–911)

## 2019-04-15 MED ORDER — ALLOPURINOL 100 MG PO TABS: 100.0000 mg | ORAL_TABLET | Freq: Every day | ORAL | 3 refills | Status: DC

## 2019-04-15 MED ORDER — COLCHICINE 0.6 MG PO TABS: 0.6000 mg | ORAL_TABLET | Freq: Every day | ORAL | 3 refills | Status: DC

## 2019-04-15 MED ORDER — SPIRONOLACTONE 25 MG PO TABS: 25.0000 mg | ORAL_TABLET | Freq: Every day | ORAL | 3 refills | Status: DC

## 2019-04-15 MED ORDER — CARVEDILOL 12.5 MG PO TABS: 12.5000 mg | ORAL_TABLET | Freq: Two times a day (BID) | ORAL | 3 refills | Status: DC

## 2019-04-15 MED ORDER — ATORVASTATIN CALCIUM 80 MG PO TABS: ORAL_TABLET | ORAL | 3 refills | Status: DC

## 2019-04-15 NOTE — Patient Instructions (Addendum)
.  You had the flu shot today  OK to increase the carvedilol to 12.5 mg twice per day  Please continue all other medications as before, and refills have been done if requested.  Please have the pharmacy call with any other refills you may need.  Please continue your efforts at being more active, low cholesterol diet, and weight control.  You are otherwise up to date with prevention measures today.  Please keep your appointments with your specialists as you may have planned  Please go to the LAB in the Basement (turn left off the elevator) for the tests to be done today  You will be contacted by phone if any changes need to be made immediately.  Otherwise, you will receive a letter about your results with an explanation, but please check with MyChart first.  Please remember to sign up for MyChart if you have not done so, as this will be important to you in the future with finding out test results, communicating by private email, and scheduling acute appointments online when needed.  Please return in 6 months, or sooner if needed

## 2019-04-15 NOTE — Progress Notes (Signed)
Subjective:    Patient ID: Gary Levy, male    DOB: [birth year], 78 y.o.   MRN: [id number]  HPI  Here for wellness and f/u;  Overall doing ok;  Pt denies Chest pain, worsening SOB, DOE, wheezing, orthopnea, PND, worsening LE edema, palpitations, dizziness or syncope.  Pt denies neurological change such as new headache, facial or extremity weakness.  Pt denies polydipsia, polyuria, or low sugar symptoms. Pt states overall good compliance with treatment and medications, good tolerability, and has been trying to follow appropriate diet.  Pt denies worsening depressive symptoms, suicidal ideation or panic. No fever, night sweats, wt loss, loss of appetite, or other constitutional symptoms.  Pt states good ability with ADL's, has low fall risk, home safety reviewed and adequate, no other significant changes in hearing or vision, and only occasionally active with exercise.  Denies urinary symptoms such as dysuria, frequency, urgency, flank pain, hematuria or n/v, fever, chills.   No new complaints Past Medical History:  Diagnosis Date  . Alcohol abuse    hx  . Anemia, iron deficiency   . AR (allergic rhinitis)   . BPH (benign prostatic hypertrophy)   . CAD (coronary artery disease) of artery bypass graft   . CHF (congestive heart failure) ([place])   . CKD (chronic kidney disease) 09/30/2011  . COPD (chronic obstructive pulmonary disease) ([place])   . Crohn's disease ([place])   . Dysuria   . ED (erectile dysfunction) of organic origin   . Fatigue   . Glaucoma   . Glucose intolerance (impaired glucose tolerance)   . HLD (hyperlipidemia)   . HTN (hypertension)   . Impaired glucose tolerance 09/27/2011  . MI (myocardial infarction) ([place])    hx  . Moderate or severe vision impairment, both eyes, impairment level not further specified   . Postherpetic neuralgia   . PSA elevation   . Routine general medical examination at a health care facility   . Shingles   . Skin cancer    hx; non-melanoma  .  Special screening for malignant neoplasm of prostate   . Urinary frequency    Past Surgical History:  Procedure Laterality Date  . CORONARY ANGIOPLASTY WITH STENT PLACEMENT    . CORONARY ARTERY BYPASS GRAFT     11+ years ago; has an EF of about 45%, last Myoview showed no ischemia 12/30/06  . [facility]  2011   Dr. Rush Farmer   . right cataract  12/11   with lens implant     reports that he quit smoking about 38 years ago. He has never used smokeless tobacco. He reports that he does not drink alcohol or use drugs. family history includes Heart disease in his mother. No Known Allergies Current Outpatient Medications on File Prior to Visit  Medication Sig Dispense Refill  . aspirin 81 MG EC tablet Take 81 mg by mouth daily.      . benzonatate (TESSALON PERLES) 100 MG capsule Take 2 capsules (200 mg total) by mouth 3 (three) times daily as needed for cough. 60 capsule 0  . bismuth-metronidazole-tetracycline (PYLERA) 140-125-125 MG per capsule Take 3 capsules by mouth 4 (four) times daily -  before meals and at bedtime. 120 capsule 0  . HYDROcodone-acetaminophen (NORCO/VICODIN) 5-325 MG tablet Take 1-2 tablets by mouth every 4 (four) hours as needed. 10 tablet 0  . Multiple Vitamins-Minerals (CENTRUM PO) Take 1 tablet by mouth daily.      . nitroGLYCERIN (NITROSTAT) 0.4 MG SL tablet Place 0.4 mg under the  tongue every 5 (five) minutes as needed for chest pain (Call 911 at 3rd dose within 15 minutes).    . triamcinolone (NASACORT) 55 MCG/ACT AERO nasal inhaler Place 2 sprays into the nose daily. 1 Inhaler 12  . cetirizine (ZYRTEC) 10 MG tablet Take 1 tablet (10 mg total) by mouth daily. 30 tablet 11   No current facility-administered medications on file prior to visit.    Review of Systems Constitutional: Negative for other unusual diaphoresis, sweats, appetite or weight changes HENT: Negative for other worsening hearing loss, ear pain, facial swelling, mouth sores or neck stiffness.   Eyes:  Negative for other worsening pain, redness or other visual disturbance.  Respiratory: Negative for other stridor or swelling Cardiovascular: Negative for other palpitations or other chest pain  Gastrointestinal: Negative for worsening diarrhea or loose stools, blood in stool, distention or other pain Genitourinary: Negative for hematuria, flank pain or other change in urine volume.  Musculoskeletal: Negative for myalgias or other joint swelling.  Skin: Negative for other color change, or other wound or worsening drainage.  Neurological: Negative for other syncope or numbness. Hematological: Negative for other adenopathy or swelling Psychiatric/Behavioral: Negative for hallucinations, other worsening agitation, SI, self-injury, or new decreased concentration All other system neg per pt    Objective:   Physical Exam BP (!) 142/86   Pulse 66   Temp 97.6 F (36.4 C) (Oral)   Ht 5\' 11"  (1.803 m)   Wt 162 lb (73.5 kg)   SpO2 96%   BMI 22.59 kg/m  VS noted,  Constitutional: Pt is oriented to person, place, and time. Appears well-developed and well-nourished, in no significant distress and comfortable Head: Normocephalic and atraumatic  Eyes: Conjunctivae and EOM are normal. Pupils are equal, round, and reactive to light Right Ear: External ear normal without discharge Left Ear: External ear normal without discharge Nose: Nose without discharge or deformity Mouth/Throat: Oropharynx is without other ulcerations and moist  Neck: Normal range of motion. Neck supple. No JVD present. No tracheal deviation present or significant neck LA or mass Cardiovascular: Normal rate, regular rhythm, normal heart sounds and intact distal pulses.   Pulmonary/Chest: WOB normal and breath sounds without rales or wheezing  Abdominal: Soft. Bowel sounds are normal. NT. No HSM  Musculoskeletal: Normal range of motion. Exhibits no edema Lymphadenopathy: Has no other cervical adenopathy.  Neurological: Pt is  alert and oriented to person, place, and time. Pt has normal reflexes. No cranial nerve deficit. Motor grossly intact, Gait intact Skin: Skin is warm and dry. No rash noted or new ulcerations Psychiatric:  Has normal mood and affect. Behavior is normal without agitation No other exam findings Lab Results  Component Value Date   WBC 3.2 (L) 04/15/2019   HGB 12.4 (L) 04/15/2019   HCT 37.7 (L) 04/15/2019   PLT 191.0 04/15/2019   GLUCOSE 97 04/15/2019   CHOL 125 04/15/2019   TRIG 61.0 04/15/2019   HDL 44.20 04/15/2019   LDLCALC 69 04/15/2019   ALT 22 04/15/2019   AST 27 04/15/2019   NA 138 04/15/2019   K 4.2 04/15/2019   CL 103 04/15/2019   CREATININE 1.48 04/15/2019   BUN 20 04/15/2019   CO2 28 04/15/2019   TSH 3.22 04/15/2019   PSA 6.00 (H) 04/15/2019   HGBA1C 5.7 04/15/2019   MICROALBUR 0.4 03/24/2012       Assessment & Plan:

## 2019-04-17 ENCOUNTER — Encounter: Payer: Self-pay | Admitting: Internal Medicine

## 2019-04-17 NOTE — Assessment & Plan Note (Signed)
Mild uncontrolled, to increase coreg 12.5 bid

## 2019-04-17 NOTE — Assessment & Plan Note (Signed)
stable overall by history and exam, recent data reviewed with pt, and pt to continue medical treatment as before,  to f/u any worsening symptoms or concerns  

## 2019-04-17 NOTE — Assessment & Plan Note (Signed)
For f/u psa, asympt 

## 2019-04-17 NOTE — Assessment & Plan Note (Signed)
Consider renal referral

## 2019-04-17 NOTE — Assessment & Plan Note (Signed)

## 2019-04-28 ENCOUNTER — Telehealth: Payer: Self-pay | Admitting: Internal Medicine

## 2019-04-28 NOTE — Telephone Encounter (Signed)
colchicine 0.6 MG tablet  Need to verify reaction of this mediation with another medication because it could cause toxicity. Please call to advise

## 2019-04-28 NOTE — Telephone Encounter (Signed)
[employer] has been informed medication is ok for pt to take.

## 2019-04-29 ENCOUNTER — Telehealth: Payer: Self-pay | Admitting: Internal Medicine

## 2019-04-29 NOTE — Telephone Encounter (Signed)
Spoke with pharmacist, informed them med is ok to take.

## 2019-04-29 NOTE — Telephone Encounter (Signed)
Copied from [place] [id number]. Topic: General - Other >> Apr 29, 2019  8:58 AM Keene Breath wrote: Reason for CRM: Called to inform the doctor that there is a possible drug interaction with the patient's medication for carvedilol (COREG) 12.5 MG tablet.  Please call to discuss at [phone]
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
## 2019-06-24 NOTE — Progress Notes (Addendum)
Subjective:   Gary Levy is a 78 y.o. male who presents for Medicare Annual/Subsequent preventive examination.  Review of Systems:   Cardiac Risk Factors include: advanced age (>17men, >14 women);dyslipidemia;hypertension;male gender Sleep patterns: feels rested on waking, gets up 2 times nightly to void and sleeps 7-8 hours nightly.    Home Safety/Smoke Alarms: Feels safe in home. Smoke alarms in place.  Living environment; residence and Firearm Safety: 1-story house/ trailer, no firearms, firearms stored safely. Lives with wife, no needs for DME, good support system Seat Belt Safety/Bike Helmet: Wears seat belt.   PSA-  Lab Results  Component Value Date   PSA 6.00 (H) 04/15/2019   PSA 6.74 (H) 10/05/2018   PSA 5.49 (H) 04/16/2018       Objective:    Vitals: There were no vitals taken for this visit.  There is no height or weight on file to calculate BMI.  Advanced Directives 06/27/2019 10/01/2018 04/22/2018 12/31/2016 10/14/2016  Does Patient Have a Medical Advance Directive? Yes No No No Yes  Type of Paramedic of [place];Living will - - - Living will  Copy of [place] in Chart? No - copy requested - - - -  Would patient like information on creating a medical advance directive? - No - Patient declined No - Patient declined No - Patient declined -    Tobacco Social History   Tobacco Use  Smoking Status Former Smoker  . Quit date: 03/20/1981  . Years since quitting: 38.2  Smokeless Tobacco Never Used     Counseling given: Not Answered  Past Medical History:  Diagnosis Date  . Alcohol abuse    hx  . Anemia, iron deficiency   . AR (allergic rhinitis)   . BPH (benign prostatic hypertrophy)   . CAD (coronary artery disease) of artery bypass graft   . CHF (congestive heart failure) ([place])   . CKD (chronic kidney disease) 09/30/2011  . COPD (chronic obstructive pulmonary disease) ([place])   . Crohn's disease ([place])   . Dysuria    . ED (erectile dysfunction) of organic origin   . Fatigue   . Glaucoma   . Glucose intolerance (impaired glucose tolerance)   . HLD (hyperlipidemia)   . HTN (hypertension)   . Impaired glucose tolerance 09/27/2011  . MI (myocardial infarction) ([place])    hx  . Moderate or severe vision impairment, both eyes, impairment level not further specified   . Postherpetic neuralgia   . PSA elevation   . Routine general medical examination at a health care facility   . Shingles   . Skin cancer    hx; non-melanoma  . Special screening for malignant neoplasm of prostate   . Urinary frequency    Past Surgical History:  Procedure Laterality Date  . CORONARY ANGIOPLASTY WITH STENT PLACEMENT    . CORONARY ARTERY BYPASS GRAFT     11+ years ago; has an EF of about 45%, last Myoview showed no ischemia 12/30/06  . [facility]  2011   Dr. Rush Farmer   . right cataract Bilateral 07/2010   with lens implant    Family History  Problem Relation Age of Onset  . Heart disease Mother    Social History   Socioeconomic History  . Marital status: Married    Spouse name: Not on file  . Number of children: 6  . Years of education: Not on file  . Highest education level: Not on file  Occupational History  . Occupation:  Retired  Scientific laboratory technician  . Financial resource strain: Not hard at all  . Food insecurity    Worry: Never true    Inability: Never true  . Transportation needs    Medical: No    Non-medical: No  Tobacco Use  . Smoking status: Former Smoker    Quit date: 03/20/1981    Years since quitting: 38.2  . Smokeless tobacco: Never Used  Substance and Sexual Activity  . Alcohol use: No  . Drug use: No  . Sexual activity: Yes  Lifestyle  . Physical activity    Days per week: 4 days    Minutes per session: 50 min  . Stress: Not at all  Relationships  . Social connections    Talks on phone: More than three times a week    Gets together: More than three times a week    Attends religious service: 1 to  4 times per year    Active member of club or organization: Yes    Attends meetings of clubs or organizations: 1 to 4 times per year    Relationship status: Married  Other Topics Concern  . Not on file  Social History Narrative   Married, 4 children; retired - Museum/gallery curator.      Designated party release on file. [employer]. 01/15/10.     Outpatient Encounter Medications as of 06/27/2019  Medication Sig  . allopurinol (ZYLOPRIM) 100 MG tablet Take 1 tablet (100 mg total) by mouth daily.  Marland Kitchen aspirin 81 MG EC tablet Take 81 mg by mouth daily.    Marland Kitchen atorvastatin (LIPITOR) 80 MG tablet TAKE 1 TABLET BY MOUTH DAILY AT 6PM.  . benzonatate (TESSALON PERLES) 100 MG capsule Take 2 capsules (200 mg total) by mouth 3 (three) times daily as needed for cough.  . bismuth-metronidazole-tetracycline (PYLERA) 140-125-125 MG per capsule Take 3 capsules by mouth 4 (four) times daily -  before meals and at bedtime.  . carvedilol (COREG) 12.5 MG tablet Take 1 tablet (12.5 mg total) by mouth 2 (two) times daily with a meal.  . colchicine 0.6 MG tablet Take 1 tablet (0.6 mg total) by mouth daily. (Patient taking differently: Take 0.6 mg by mouth as needed. )  . HYDROcodone-acetaminophen (NORCO/VICODIN) 5-325 MG tablet Take 1-2 tablets by mouth every 4 (four) hours as needed.  . Multiple Vitamins-Minerals (CENTRUM PO) Take 1 tablet by mouth daily.    . nitroGLYCERIN (NITROSTAT) 0.4 MG SL tablet Place 0.4 mg under the tongue every 5 (five) minutes as needed for chest pain (Call 911 at 3rd dose within 15 minutes).  Marland Kitchen spironolactone (ALDACTONE) 25 MG tablet Take 1 tablet (25 mg total) by mouth daily.  Marland Kitchen triamcinolone (NASACORT) 55 MCG/ACT AERO nasal inhaler Place 2 sprays into the nose daily.  . cetirizine (ZYRTEC) 10 MG tablet Take 1 tablet (10 mg total) by mouth daily.   No facility-administered encounter medications on file as of 06/27/2019.     Activities of Daily Living In your present state of health, do  you have any difficulty performing the following activities: 06/27/2019  Hearing? N  Vision? N  Difficulty concentrating or making decisions? N  Walking or climbing stairs? N  Dressing or bathing? N  Doing errands, shopping? N  Preparing Food and eating ? N  Using the Toilet? N  In the past six months, have you accidently leaked urine? N  Do you have problems with loss of bowel control? N  Managing your Medications? N  Managing  your Finances? N  Housekeeping or managing your Housekeeping? N  Some recent data might be hidden    Patient Care Team: Biagio Borg, MD as PCP - General Angelena Form Annita Brod, MD as PCP - Cardiology (Cardiology) Calvert Cantor, MD as Consulting Physician (Ophthalmology)   Assessment:   This is a routine wellness examination for [place]. Physical assessment deferred to PCP.  Exercise Activities and Dietary recommendations Current Exercise Habits: Home exercise routine, Type of exercise: walking, Time (Minutes): 40, Frequency (Times/Week): 5, Weekly Exercise (Minutes/Week): 200, Intensity: Mild, Exercise limited by: None identified  Diet (meal preparation, eat out, water intake, caffeinated beverages, dairy products, fruits and vegetables): in general, a "healthy" diet  , well balanced. eats a variety of fruits and vegetables daily, limits salt, fat/cholesterol, sugar,carbohydrates,caffeine, drinks 6-8 glasses of water daily.   Goals    . Patient Stated     Continue to be as active as possible. Enjoy life and family.        Fall Risk Fall Risk  06/27/2019 04/15/2019 04/22/2018 10/15/2017 04/14/2017  Falls in the past year? 0 0 No No No  Number falls in past yr: 0 - - - -  Injury with Fall? 0 - - - -  Risk for fall due to : Impaired balance/gait - - - -   Is the patient's home free of loose throw rugs in walkways, pet beds, electrical cords, etc?   yes      Grab bars in the bathroom? yes      Handrails on the stairs?   yes      Adequate lighting?   yes   Depression Screen PHQ 2/9 Scores 06/27/2019 04/15/2019 04/22/2018 10/15/2017  PHQ - 2 Score 0 0 0 0  PHQ- 9 Score - - - 0    Cognitive Function MMSE - Mini Mental State Exam 04/22/2018  Orientation to time 5  Orientation to Place 5  Registration 3  Attention/ Calculation 0  Attention/Calculation-comments states he cannot do this  Recall 2  Language- name 2 objects 2  Language- repeat 1  Language- follow 3 step command 3  Language- read & follow direction 1  Write a sentence 1  Copy design 1  Total score 24       Ad8 score reviewed for issues:  Issues making decisions: no  Less interest in hobbies / activities: no  Repeats questions, stories (family complaining): no  Trouble using ordinary gadgets (microwave, computer, phone):no  Forgets the month or year: no  Mismanaging finances: no  Remembering appts: no  Daily problems with thinking and/or memory: no Ad8 score is= 0  Immunization History  Administered Date(s) Administered  . Fluad Quad(high Dose 65+) 04/15/2019  . Influenza Split 05/05/2012  . Influenza Whole 08/15/2009, 06/11/2010  . Influenza, High Dose Seasonal PF 06/07/2013, 04/12/2015, 07/01/2016, 04/14/2017, 04/16/2018  . Influenza,inj,Quad PF,6+ Mos 04/11/2014  . Pneumococcal Conjugate-13 10/12/2013  . Pneumococcal Polysaccharide-23 08/19/1995, 08/15/2009  . Td 08/19/1995, 08/15/2009   Screening Tests Health Maintenance  Topic Date Due  . TETANUS/TDAP  08/16/2019  . INFLUENZA VACCINE  Completed  . PNA vac Low Risk Adult  Completed        Plan:    Reviewed health maintenance screenings with patient today and relevant education, vaccines, and/or referrals were provided.   Continue to eat heart healthy diet (full of fruits, vegetables, whole grains, lean protein, water--limit salt, fat, and sugar intake) and increase physical activity as tolerated.  Continue doing brain stimulating activities (puzzles, reading,  adult coloring books, staying  active) to keep memory sharp.   I have personally reviewed and noted the following in the patient's chart:   . Medical and social history . Use of alcohol, tobacco or illicit drugs  . Current medications and supplements . Functional ability and status . Nutritional status . Physical activity . Advanced directives . List of other physicians . Screenings to include cognitive, depression, and falls . Referrals and appointments  In addition, I have reviewed and discussed with patient certain preventive protocols, quality metrics, and best practice recommendations. A written personalized care plan for preventive services as well as general preventive health recommendations were provided to patient.     Michiel Cowboy, RN  06/27/2019  Medical screening examination/treatment/procedure(s) were performed by non-physician practitioner and as supervising physician I was immediately available for consultation/collaboration. I agree with above. Cathlean Cower, MD

## 2019-06-27 ENCOUNTER — Ambulatory Visit (INDEPENDENT_AMBULATORY_CARE_PROVIDER_SITE_OTHER): Payer: Medicare HMO | Admitting: *Deleted

## 2019-06-27 DIAGNOSIS — Z0000 Encounter for general adult medical examination without abnormal findings: Secondary | ICD-10-CM | POA: Diagnosis not present

## 2019-07-08 DIAGNOSIS — H40023 Open angle with borderline findings, high risk, bilateral: Secondary | ICD-10-CM | POA: Diagnosis not present

## 2019-07-08 DIAGNOSIS — H43813 Vitreous degeneration, bilateral: Secondary | ICD-10-CM | POA: Diagnosis not present

## 2019-07-08 DIAGNOSIS — H04123 Dry eye syndrome of bilateral lacrimal glands: Secondary | ICD-10-CM | POA: Diagnosis not present

## 2019-08-26 DIAGNOSIS — Z0100 Encounter for examination of eyes and vision without abnormal findings: Secondary | ICD-10-CM | POA: Diagnosis not present

## 2019-10-20 ENCOUNTER — Other Ambulatory Visit: Payer: Self-pay

## 2019-10-20 ENCOUNTER — Encounter: Payer: Self-pay | Admitting: Internal Medicine

## 2019-10-20 ENCOUNTER — Ambulatory Visit (INDEPENDENT_AMBULATORY_CARE_PROVIDER_SITE_OTHER): Payer: Medicare HMO | Admitting: Internal Medicine

## 2019-10-20 VITALS — BP 144/76 | HR 51 | Temp 97.9°F | Ht 71.0 in | Wt 167.6 lb

## 2019-10-20 DIAGNOSIS — R7302 Impaired glucose tolerance (oral): Secondary | ICD-10-CM

## 2019-10-20 DIAGNOSIS — N183 Chronic kidney disease, stage 3 unspecified: Secondary | ICD-10-CM

## 2019-10-20 DIAGNOSIS — Z0000 Encounter for general adult medical examination without abnormal findings: Secondary | ICD-10-CM

## 2019-10-20 LAB — URINALYSIS, ROUTINE W REFLEX MICROSCOPIC
Bilirubin Urine: NEGATIVE
Hgb urine dipstick: NEGATIVE
Ketones, ur: NEGATIVE
Leukocytes,Ua: NEGATIVE
Nitrite: NEGATIVE
RBC / HPF: NONE SEEN (ref 0–?)
Specific Gravity, Urine: 1.02 (ref 1.000–1.030)
Total Protein, Urine: NEGATIVE
Urine Glucose: NEGATIVE
Urobilinogen, UA: 0.2 (ref 0.0–1.0)
pH: 6 (ref 5.0–8.0)

## 2019-10-20 LAB — BASIC METABOLIC PANEL
BUN: 22 mg/dL (ref 6–23)
CO2: 31 mEq/L (ref 19–32)
Calcium: 10.4 mg/dL (ref 8.4–10.5)
Chloride: 104 mEq/L (ref 96–112)
Creatinine, Ser: 1.71 mg/dL — ABNORMAL HIGH (ref 0.40–1.50)
GFR: 46.96 mL/min — ABNORMAL LOW (ref >60.00)
Glucose, Bld: 92 mg/dL (ref 70–99)
Potassium: 5.5 mEq/L — ABNORMAL HIGH (ref 3.5–5.1)
Sodium: 139 mEq/L (ref 135–145)

## 2019-10-20 LAB — LIPID PANEL
Cholesterol: 120 mg/dL (ref 0–200)
HDL: 41.4 mg/dL (ref >39.00)
LDL Cholesterol: 65 mg/dL (ref 0–99)
NonHDL: 79.02
Total CHOL/HDL Ratio: 3
Triglycerides: 68 mg/dL (ref 0.0–149.0)
VLDL: 13.6 mg/dL (ref 0.0–40.0)

## 2019-10-20 LAB — CBC WITH DIFFERENTIAL/PLATELET
Basophils Absolute: 0.1 10*3/uL (ref 0.0–0.1)
Basophils Relative: 1.5 % (ref 0.0–3.0)
Eosinophils Absolute: 0.2 10*3/uL (ref 0.0–0.7)
Eosinophils Relative: 4.8 % (ref 0.0–5.0)
HCT: 37.9 % — ABNORMAL LOW (ref 39.0–52.0)
Hemoglobin: 12.5 g/dL — ABNORMAL LOW (ref 13.0–17.0)
Lymphocytes Relative: 24.8 % (ref 12.0–46.0)
Lymphs Abs: 1 10*3/uL (ref 0.7–4.0)
MCHC: 32.9 g/dL (ref 30.0–36.0)
MCV: 89.3 fl (ref 78.0–100.0)
Monocytes Absolute: 0.5 10*3/uL (ref 0.1–1.0)
Monocytes Relative: 12.6 % — ABNORMAL HIGH (ref 3.0–12.0)
Neutro Abs: 2.2 10*3/uL (ref 1.4–7.7)
Neutrophils Relative %: 56.3 % (ref 43.0–77.0)
Platelets: 179 10*3/uL (ref 150.0–400.0)
RBC: 4.25 Mil/uL (ref 4.22–5.81)
RDW: 14.6 % (ref 11.5–15.5)
WBC: 3.9 10*3/uL — ABNORMAL LOW (ref 4.0–10.5)

## 2019-10-20 LAB — PSA: PSA: 5.17 ng/mL — ABNORMAL HIGH (ref 0.10–4.00)

## 2019-10-20 LAB — HEPATIC FUNCTION PANEL
ALT: 19 U/L (ref 0–53)
AST: 25 U/L (ref 0–37)
Albumin: 4.2 g/dL (ref 3.5–5.2)
Alkaline Phosphatase: 57 U/L (ref 39–117)
Bilirubin, Direct: 0.2 mg/dL (ref 0.0–0.3)
Total Bilirubin: 1 mg/dL (ref 0.2–1.2)
Total Protein: 7.6 g/dL (ref 6.0–8.3)

## 2019-10-20 LAB — TSH: TSH: 3.77 u[IU]/mL (ref 0.35–4.50)

## 2019-10-20 LAB — HEMOGLOBIN A1C: Hgb A1c MFr Bld: 5.5 % (ref 4.6–6.5)

## 2019-10-20 NOTE — Patient Instructions (Signed)

## 2019-10-20 NOTE — Progress Notes (Signed)
Subjective:    Patient ID: Gary Levy, male    DOB: 1941/01/30, 79 y.o.   MRN: [id number]  HPI  Here for wellness and f/u;  Overall doing ok;  Pt denies Chest pain, worsening SOB, DOE, wheezing, orthopnea, PND, worsening LE edema, palpitations, dizziness or syncope.  Pt denies neurological change such as new headache, facial or extremity weakness.  Pt denies polydipsia, polyuria, or low sugar symptoms. Pt states overall good compliance with treatment and medications, good tolerability, and has been trying to follow appropriate diet.  Pt denies worsening depressive symptoms, suicidal ideation or panic. No fever, night sweats, wt loss, loss of appetite, or other constitutional symptoms.  Pt states good ability with ADL's, has low fall risk, home safety reviewed and adequate, no other significant changes in hearing or vision, and only occasionally active with exercise. S/p covid vaccine x 2.  Denies urinary symptoms such as dysuria, frequency, urgency, flank pain, hematuria or n/v, fever, chills. Past Medical History:  Diagnosis Date  . Alcohol abuse    hx  . Anemia, iron deficiency   . AR (allergic rhinitis)   . BPH (benign prostatic hypertrophy)   . CAD (coronary artery disease) of artery bypass graft   . CHF (congestive heart failure) ([place])   . CKD (chronic kidney disease) 09/30/2011  . COPD (chronic obstructive pulmonary disease) ([place])   . Crohn's disease ([place])   . Dysuria   . ED (erectile dysfunction) of organic origin   . Fatigue   . Glaucoma   . Glucose intolerance (impaired glucose tolerance)   . HLD (hyperlipidemia)   . HTN (hypertension)   . Impaired glucose tolerance 09/27/2011  . MI (myocardial infarction) ([place])    hx  . Moderate or severe vision impairment, both eyes, impairment level not further specified   . Postherpetic neuralgia   . PSA elevation   . Routine general medical examination at a health care facility   . Shingles   . Skin cancer    hx; non-melanoma  .  Special screening for malignant neoplasm of prostate   . Urinary frequency    Past Surgical History:  Procedure Laterality Date  . CORONARY ANGIOPLASTY WITH STENT PLACEMENT    . CORONARY ARTERY BYPASS GRAFT     11+ years ago; has an EF of about 45%, last Myoview showed no ischemia 12/30/06  . [facility]  2011   Dr. Rush Farmer   . right cataract Bilateral 07/2010   with lens implant     reports that he quit smoking about 38 years ago. He has never used smokeless tobacco. He reports that he does not drink alcohol or use drugs. family history includes Heart disease in his mother. No Known Allergies Current Outpatient Medications on File Prior to Visit  Medication Sig Dispense Refill  . allopurinol (ZYLOPRIM) 100 MG tablet Take 1 tablet (100 mg total) by mouth daily. 90 tablet 3  . aspirin 81 MG EC tablet Take 81 mg by mouth daily.      Marland Kitchen atorvastatin (LIPITOR) 80 MG tablet TAKE 1 TABLET BY MOUTH DAILY AT 6PM. 90 tablet 3  . benzonatate (TESSALON PERLES) 100 MG capsule Take 2 capsules (200 mg total) by mouth 3 (three) times daily as needed for cough. 60 capsule 0  . bismuth-metronidazole-tetracycline (PYLERA) 140-125-125 MG per capsule Take 3 capsules by mouth 4 (four) times daily -  before meals and at bedtime. 120 capsule 0  . carvedilol (COREG) 12.5 MG tablet Take 1 tablet (12.5 mg total)  by mouth 2 (two) times daily with a meal. 180 tablet 3  . colchicine 0.6 MG tablet Take 1 tablet (0.6 mg total) by mouth daily. (Patient taking differently: Take 0.6 mg by mouth as needed. ) 90 tablet 3  . HYDROcodone-acetaminophen (NORCO/VICODIN) 5-325 MG tablet Take 1-2 tablets by mouth every 4 (four) hours as needed. 10 tablet 0  . Multiple Vitamins-Minerals (CENTRUM PO) Take 1 tablet by mouth daily.      . nitroGLYCERIN (NITROSTAT) 0.4 MG SL tablet Place 0.4 mg under the tongue every 5 (five) minutes as needed for chest pain (Call 911 at 3rd dose within 15 minutes).    Marland Kitchen spironolactone (ALDACTONE) 25 MG tablet  Take 1 tablet (25 mg total) by mouth daily. 90 tablet 3  . triamcinolone (NASACORT) 55 MCG/ACT AERO nasal inhaler Place 2 sprays into the nose daily. 1 Inhaler 12  . cetirizine (ZYRTEC) 10 MG tablet Take 1 tablet (10 mg total) by mouth daily. 30 tablet 11   No current facility-administered medications on file prior to visit.   Review of Systems All otherwise neg per pt     Objective:   Physical Exam BP (!) 144/76   Pulse (!) 51   Temp 97.9 F (36.6 C)   Ht 5\' 11"  (1.803 m)   Wt 167 lb 9.6 oz (76 kg)   SpO2 99%   BMI 23.38 kg/m  VS noted,  Constitutional: Pt appears in NAD HENT: Head: NCAT.  Right Ear: External ear normal.  Left Ear: External ear normal.  Eyes: . Pupils are equal, round, and reactive to light. Conjunctivae and EOM are normal Nose: without d/c or deformity Neck: Neck supple. Gross normal ROM Cardiovascular: Normal rate and regular rhythm.   Pulmonary/Chest: Effort normal and breath sounds without rales or wheezing.  Abd:  Soft, NT, ND, + BS, no organomegaly Neurological: Pt is alert. At baseline orientation, motor grossly intact Skin: Skin is warm. No rashes, other new lesions, no LE edema Psychiatric: Pt behavior is normal without agitation  All otherwise neg per pt Lab Results  Component Value Date   WBC 3.9 (L) 10/20/2019   HGB 12.5 (L) 10/20/2019   HCT 37.9 (L) 10/20/2019   PLT 179.0 10/20/2019   GLUCOSE 92 10/20/2019   CHOL 120 10/20/2019   TRIG 68.0 10/20/2019   HDL 41.40 10/20/2019   LDLCALC 65 10/20/2019   ALT 19 10/20/2019   AST 25 10/20/2019   NA 139 10/20/2019   K 5.5 (H) 10/20/2019   CL 104 10/20/2019   CREATININE 1.71 (H) 10/20/2019   BUN 22 10/20/2019   CO2 31 10/20/2019   TSH 3.77 10/20/2019   PSA 5.17 (H) 10/20/2019   HGBA1C 5.5 10/20/2019   MICROALBUR 0.4 03/24/2012      Assessment & Plan:

## 2019-10-22 ENCOUNTER — Encounter: Payer: Self-pay | Admitting: Internal Medicine

## 2019-10-22 NOTE — Assessment & Plan Note (Signed)
stable overall by history and exam, recent data reviewed with pt, and pt to continue medical treatment as before,  to f/u any worsening symptoms or concerns  

## 2019-10-22 NOTE — Assessment & Plan Note (Signed)

## 2019-12-02 ENCOUNTER — Telehealth: Payer: Self-pay

## 2019-12-02 DIAGNOSIS — R7302 Impaired glucose tolerance (oral): Secondary | ICD-10-CM

## 2019-12-02 MED ORDER — BD SWAB SINGLE USE REGULAR PADS: MEDICATED_PAD | 3 refills | Status: AC

## 2019-12-02 MED ORDER — TRUE METRIX LEVEL 1 LOW VI SOLN: 3 refills | Status: AC

## 2019-12-02 NOTE — Telephone Encounter (Signed)
Done erx to [facility]

## 2019-12-06 NOTE — Telephone Encounter (Signed)
New message:   Gary Levy is calling and states a request for medical supplies was sent over and they received the approval for everything except for the meter/strips and lancets. Please advise.l

## 2019-12-06 NOTE — Telephone Encounter (Signed)
All I see is that these were not reqeusted  It would be really really really really helpful to know which meter/strips and lancets he is requesting (and how often he checks his sugars0 that is also covered with his insurance, as there are many many many many many many many different kinds and I cannot be expected to know which one

## 2019-12-06 NOTE — Telephone Encounter (Signed)
Please see message. °

## 2019-12-12 ENCOUNTER — Other Ambulatory Visit: Payer: Self-pay

## 2019-12-13 ENCOUNTER — Other Ambulatory Visit: Payer: Self-pay

## 2019-12-13 MED ORDER — BLOOD GLUCOSE MONITOR KIT: PACK | 0 refills | Status: AC

## 2019-12-13 NOTE — Addendum Note (Signed)
Addended by: [name] R on: 12/13/2019 03:37 PM   Modules accepted: Orders

## 2019-12-13 NOTE — Telephone Encounter (Signed)
Message was resent to Dr. Jenny Reichmann CMA

## 2019-12-16 ENCOUNTER — Ambulatory Visit: Payer: Medicare HMO | Admitting: Cardiovascular Disease

## 2019-12-16 ENCOUNTER — Other Ambulatory Visit: Payer: Self-pay

## 2019-12-16 ENCOUNTER — Encounter: Payer: Self-pay | Admitting: Cardiovascular Disease

## 2019-12-16 VITALS — BP 124/62 | HR 59 | Ht 71.0 in | Wt 169.0 lb

## 2019-12-16 DIAGNOSIS — I5022 Chronic systolic (congestive) heart failure: Secondary | ICD-10-CM | POA: Diagnosis not present

## 2019-12-16 DIAGNOSIS — I255 Ischemic cardiomyopathy: Secondary | ICD-10-CM | POA: Diagnosis not present

## 2019-12-16 DIAGNOSIS — J449 Chronic obstructive pulmonary disease, unspecified: Secondary | ICD-10-CM | POA: Diagnosis not present

## 2019-12-16 DIAGNOSIS — E785 Hyperlipidemia, unspecified: Secondary | ICD-10-CM | POA: Diagnosis not present

## 2019-12-16 DIAGNOSIS — I10 Essential (primary) hypertension: Secondary | ICD-10-CM

## 2019-12-16 DIAGNOSIS — I25810 Atherosclerosis of coronary artery bypass graft(s) without angina pectoris: Secondary | ICD-10-CM | POA: Diagnosis not present

## 2019-12-16 NOTE — Patient Instructions (Signed)
Medication Instructions:  No changes *If you need a refill on your cardiac medications before your next appointment, please call your pharmacy*   Lab Work: None ordered  Testing/Procedures: None ordered  Follow-Up: At [facility], you and your health needs are our priority.  As part of our continuing mission to provide you with exceptional heart care, we have created designated Provider Care Teams.  These Care Teams include your primary Cardiologist (physician) and Advanced Practice Providers (APPs -  Physician Assistants and Nurse Practitioners) who all work together to provide you with the care you need, when you need it.  Your next appointment:   12 month(s)  The format for your next appointment:   Either In Person or Virtual  Provider:   You may see Lauree Chandler, MD or one of the following Advanced Practice Providers on your designated Care Team:    Melina Copa, PA-C  Ermalinda Barrios, PA-C    Other Instructions

## 2019-12-16 NOTE — Progress Notes (Signed)
Chief Complaint  Patient presents with  . Follow-up    CAD   History of Present Illness: 79 yo male with history of CAD s/p 4V CABG in 1995, ischemic cardiomyopathy, HTN, HLD, COPD, CKD here today for cardiac follow up. He has CAD dating back to the 1990s with prior PCI and CABG. The last cath report I can find is scanned into EPIC from 1999 and shows failure of the LIMA graft to the LAD and the SVG to the RCA. There was high grade disease in the SVG to the OM and the SVG to the Diagonal. It appears that the LAD was treated with rotablator atherectomy and stenting in 1999. The SVG to the OM and the SVG to the Diagonal were treated with angioplasty. I saw him 07/28/13 and he had c/o exertional chest pains, resolved with rest. Stress myoview 08/17/13 Intermediate risk study with a fixed large, severe inferolateral, basal to mid inferior, and mid to apical anterior perfusion defect. This suggested prior infarction with no significant ischemia. Echo April 2019 with LVEF=40-45%. Mild MR.   Primary Care Physician: Biagio Borg, MD  Past Medical History:  Diagnosis Date  . Alcohol abuse    hx  . Anemia, iron deficiency   . AR (allergic rhinitis)   . BPH (benign prostatic hypertrophy)   . CAD (coronary artery disease) of artery bypass graft   . CHF (congestive heart failure) ([place])   . CKD (chronic kidney disease) 09/30/2011  . COPD (chronic obstructive pulmonary disease) ([place])   . Crohn's disease ([place])   . Dysuria   . ED (erectile dysfunction) of organic origin   . Fatigue   . Glaucoma   . Glucose intolerance (impaired glucose tolerance)   . HLD (hyperlipidemia)   . HTN (hypertension)   . Impaired glucose tolerance 09/27/2011  . MI (myocardial infarction) ([place])    hx  . Moderate or severe vision impairment, both eyes, impairment level not further specified   . Postherpetic neuralgia   . PSA elevation   . Routine general medical examination at a health care facility   . Shingles   . Skin  cancer    hx; non-melanoma  . Special screening for malignant neoplasm of prostate   . Urinary frequency     Past Surgical History:  Procedure Laterality Date  . CORONARY ANGIOPLASTY WITH STENT PLACEMENT    . CORONARY ARTERY BYPASS GRAFT     11+ years ago; has an EF of about 45%, last Myoview showed no ischemia 12/30/06  . [facility]  2011   Dr. Rush Farmer   . right cataract Bilateral 07/2010   with lens implant     Current Outpatient Medications  Medication Sig Dispense Refill  . Alcohol Swabs (B-D SINGLE USE SWABS REGULAR) PADS Use as directed twice daily E11.9 200 each 3  . allopurinol (ZYLOPRIM) 100 MG tablet Take 1 tablet (100 mg total) by mouth daily. 90 tablet 3  . aspirin 81 MG EC tablet Take 81 mg by mouth daily.      Marland Kitchen atorvastatin (LIPITOR) 80 MG tablet TAKE 1 TABLET BY MOUTH DAILY AT 6PM. 90 tablet 3  . bismuth-metronidazole-tetracycline (PYLERA) 140-125-125 MG per capsule Take 3 capsules by mouth 4 (four) times daily -  before meals and at bedtime. 120 capsule 0  . Blood Glucose Calibration (TRUE METRIX LEVEL 1) Low SOLN Use as directed once daily E11.9 3 each 3  . blood glucose meter kit and supplies KIT Use daily to check blood  sugar levels. 1 each 0  . carvedilol (COREG) 12.5 MG tablet Take 1 tablet (12.5 mg total) by mouth 2 (two) times daily with a meal. 180 tablet 3  . cetirizine (ZYRTEC) 10 MG tablet Take 1 tablet (10 mg total) by mouth daily. 30 tablet 11  . colchicine 0.6 MG tablet Take 1 tablet (0.6 mg total) by mouth daily. (Patient taking differently: Take 0.6 mg by mouth as needed. ) 90 tablet 3  . HYDROcodone-acetaminophen (NORCO/VICODIN) 5-325 MG tablet Take 1-2 tablets by mouth every 4 (four) hours as needed. 10 tablet 0  . Multiple Vitamins-Minerals (CENTRUM PO) Take 1 tablet by mouth daily.      . nitroGLYCERIN (NITROSTAT) 0.4 MG SL tablet Place 0.4 mg under the tongue every 5 (five) minutes as needed for chest pain (Call 911 at 3rd dose within 15 minutes).    Marland Kitchen  spironolactone (ALDACTONE) 25 MG tablet Take 1 tablet (25 mg total) by mouth daily. 90 tablet 3  . triamcinolone (NASACORT) 55 MCG/ACT AERO nasal inhaler Place 2 sprays into the nose daily. 1 Inhaler 12   No current facility-administered medications for this visit.    No Known Allergies  Social History   Socioeconomic History  . Marital status: Married    Spouse name: Not on file  . Number of children: 6  . Years of education: Not on file  . Highest education level: Not on file  Occupational History  . Occupation: Retired  Tobacco Use  . Smoking status: Former Smoker    Quit date: 03/20/1981    Years since quitting: 38.7  . Smokeless tobacco: Never Used  Substance and Sexual Activity  . Alcohol use: No  . Drug use: No  . Sexual activity: Yes  Other Topics Concern  . Not on file  Social History Narrative   Married, 4 children; retired - Museum/gallery curator.      Designated party release on file. [employer]. 01/15/10.    Social Determinants of Health   Financial Resource Strain:   . Difficulty of Paying Living Expenses:   Food Insecurity:   . Worried About Charity fundraiser in the Last Year:   . Arboriculturist in the Last Year:   Transportation Needs:   . Film/video editor (Medical):   Marland Kitchen Lack of Transportation (Non-Medical):   Physical Activity:   . Days of Exercise per Week:   . Minutes of Exercise per Session:   Stress:   . Feeling of Stress :   Social Connections: Unknown  . Frequency of Communication with Friends and Family: Not on file  . Frequency of Social Gatherings with Friends and Family: Not on file  . Attends Religious Services: 1 to 4 times per year  . Active Member of Clubs or Organizations: Yes  . Attends Archivist Meetings: 1 to 4 times per year  . Marital Status: Not on file  Intimate Partner Violence:   . Fear of Current or Ex-Partner:   . Emotionally Abused:   Marland Kitchen Physically Abused:   . Sexually Abused:     Family History   Problem Relation Age of Onset  . Heart disease Mother     Review of Systems:  As stated in the HPI and otherwise negative.   BP 124/62   Pulse (!) 59   Ht _0  (1.803 m)   Wt 169 lb (76.7 kg)   SpO2 98%   BMI 23.57 kg/m   Physical Examination:  General: Well  developed, well nourished, NAD  HEENT: OP clear, mucus membranes moist  SKIN: warm, dry. No rashes. Neuro: No focal deficits  Musculoskeletal: Muscle strength 5/5 all ext  Psychiatric: Mood and affect normal  Neck: No JVD, no carotid bruits, no thyromegaly, no lymphadenopathy.  Lungs:Clear bilaterally, no wheezes, rhonci, crackles Cardiovascular: Regular rate and rhythm. No murmurs, gallops or rubs. Abdomen:Soft. Bowel sounds present. Non-tender.  Extremities: No lower extremity edema. Pulses are 2 + in the bilateral DP/PT.  Echo April 2019: Left ventricle: Septal , apical and distal anterior wall  hypokinesis. The cavity size was mildly dilated. Wall thickness  was increased in a pattern of mild LVH. Systolic function was  mildly to moderately reduced. The estimated ejection fraction was  in the range of 40% to 45%. Wall motion was normal; there were no  regional wall motion abnormalities. Left ventricular diastolic  function parameters were normal.  - Aortic valve: Calcified non coronary cusp.  - Mitral valve: There was mild regurgitation.  - Atrial septum: No defect or patent foramen ovale was identified.  - Pulmonary arteries: PA peak pressure: 33 mm Hg (S).   EKG:  EKG is ordered today. The ekg ordered today demonstrates Sinus brady  Recent Labs: 10/20/2019: ALT 19; BUN 22; Creatinine, Ser 1.71; Hemoglobin 12.5; Platelets 179.0; Potassium 5.5; Sodium 139; TSH 3.77   Lipid Panel    Component Value Date/Time   CHOL 120 10/20/2019 0925   TRIG 68.0 10/20/2019 0925   HDL 41.40 10/20/2019 0925   CHOLHDL 3 10/20/2019 0925   VLDL 13.6 10/20/2019 0925   LDLCALC 65 10/20/2019 0925     Wt Readings  from Last 3 Encounters:  12/16/19 169 lb (76.7 kg)  10/20/19 167 lb 9.6 oz (76 kg)  04/15/19 162 lb (73.5 kg)     Other studies Reviewed: Additional studies/ records that were reviewed today include: . Review of the above records demonstrates:    Assessment and Plan:   1. CAD s/p CABG without angina: He has no chest pain. Contnue ASA, statin and beta blocker.     2. HTN: BP is well controlled. Continue current therapyn   3. Hyperlipidemia: LDL at goal. Continue statin  4. Ischemic cardiomyopathy: Last LVEF 45% by echo in 2019. He is not on an Ace -inh or ARB due to renal insufficiency. Continue Coreg and aldactone.    5. Chronic systolic CHF: Weight is stable. Volume status is ok.   Current medicines are reviewed at length with the patient today.  The patient does not have concerns regarding medicines.  The following changes have been made:  no change  Labs/ tests ordered today include:   Orders Placed This Encounter  Procedures  . EKG 12-Lead    Disposition:   FU with me in 12  months  Signed, Lauree Chandler, MD 12/16/2019 10:03 AM    [facility] [address], [place], [place]  [address] Phone: [phone]; Fax: [phone]

## 2020-02-18 ENCOUNTER — Other Ambulatory Visit: Payer: Self-pay | Admitting: Internal Medicine

## 2020-02-19 NOTE — Telephone Encounter (Signed)
Please refill as per office routine med refill policy (all routine meds refilled for 3 mo or monthly per pt preference up to one year from last visit, then month to month grace period for 3 mo, then further med refills will have to be denied)  

## 2020-04-17 ENCOUNTER — Other Ambulatory Visit: Payer: Self-pay | Admitting: Internal Medicine

## 2020-04-17 NOTE — Telephone Encounter (Signed)
Please refill as per office routine med refill policy (all routine meds refilled for 3 mo or monthly per pt preference up to one year from last visit, then month to month grace period for 3 mo, then further med refills will have to be denied)  

## 2020-04-18 ENCOUNTER — Other Ambulatory Visit: Payer: Self-pay | Admitting: Internal Medicine

## 2020-04-18 NOTE — Telephone Encounter (Signed)
Please refill as per office routine med refill policy (all routine meds refilled for 3 mo or monthly per pt preference up to one year from last visit, then month to month grace period for 3 mo, then further med refills will have to be denied)  

## 2020-04-26 ENCOUNTER — Ambulatory Visit (INDEPENDENT_AMBULATORY_CARE_PROVIDER_SITE_OTHER): Payer: Medicare HMO | Admitting: Internal Medicine

## 2020-04-26 ENCOUNTER — Other Ambulatory Visit: Payer: Self-pay

## 2020-04-26 ENCOUNTER — Encounter: Payer: Self-pay | Admitting: Internal Medicine

## 2020-04-26 VITALS — BP 140/72 | HR 57 | Temp 97.7°F | Ht 71.0 in | Wt 168.0 lb

## 2020-04-26 DIAGNOSIS — R972 Elevated prostate specific antigen [PSA]: Secondary | ICD-10-CM | POA: Diagnosis not present

## 2020-04-26 DIAGNOSIS — I10 Essential (primary) hypertension: Secondary | ICD-10-CM

## 2020-04-26 DIAGNOSIS — N183 Chronic kidney disease, stage 3 unspecified: Secondary | ICD-10-CM | POA: Diagnosis not present

## 2020-04-26 DIAGNOSIS — R7302 Impaired glucose tolerance (oral): Secondary | ICD-10-CM

## 2020-04-26 DIAGNOSIS — M0600 Rheumatoid arthritis without rheumatoid factor, unspecified site: Secondary | ICD-10-CM | POA: Diagnosis not present

## 2020-04-26 LAB — COMPLETE METABOLIC PANEL WITH GFR
AG Ratio: 1.2 (calc) (ref 1.0–2.5)
ALT: 21 U/L (ref 9–46)
AST: 27 U/L (ref 10–35)
Albumin: 4.2 g/dL (ref 3.6–5.1)
Alkaline phosphatase (APISO): 60 U/L (ref 35–144)
BUN/Creatinine Ratio: 11 (calc) (ref 6–22)
BUN: 19 mg/dL (ref 7–25)
CO2: 27 mmol/L (ref 20–32)
Calcium: 10.3 mg/dL (ref 8.6–10.3)
Chloride: 105 mmol/L (ref 98–110)
Creat: 1.66 mg/dL — ABNORMAL HIGH (ref 0.70–1.18)
GFR, Est African American: 45 mL/min/{1.73_m2} — ABNORMAL LOW (ref >=60)
GFR, Est Non African American: 39 mL/min/{1.73_m2} — ABNORMAL LOW (ref >=60)
Globulin: 3.5 g/dL (calc) (ref 1.9–3.7)
Glucose, Bld: 93 mg/dL (ref 65–99)
Potassium: 5.8 mmol/L — ABNORMAL HIGH (ref 3.5–5.3)
Sodium: 137 mmol/L (ref 135–146)
Total Bilirubin: 1 mg/dL (ref 0.2–1.2)
Total Protein: 7.7 g/dL (ref 6.1–8.1)

## 2020-04-26 NOTE — Progress Notes (Signed)
Subjective:    Patient ID: Gary Levy, male    DOB: Jan 25, 1941, 79 y.o.   MRN: [id number]  HPI  Here to f/u; overall doing ok,  Pt denies chest pain, increasing sob or doe, wheezing, orthopnea, PND, increased LE swelling, palpitations, dizziness or syncope.  Pt denies new neurological symptoms such as new headache, or facial or extremity weakness or numbness.  Pt denies polydipsia, polyuria, or low sugar episode.  Pt states overall good compliance with meds, mostly trying to follow appropriate diet, with wt overall stable,  but little exercise however.  Denies urinary symptoms such as dysuria, frequency, urgency, flank pain, hematuria or n/v, fever, chills.  No new complaints Past Medical History:  Diagnosis Date  . Alcohol abuse    hx  . Anemia, iron deficiency   . AR (allergic rhinitis)   . BPH (benign prostatic hypertrophy)   . CAD (coronary artery disease) of artery bypass graft   . CHF (congestive heart failure) ([place])   . CKD (chronic kidney disease) 09/30/2011  . COPD (chronic obstructive pulmonary disease) ([place])   . Crohn's disease ([place])   . Dysuria   . ED (erectile dysfunction) of organic origin   . Fatigue   . Glaucoma   . Glucose intolerance (impaired glucose tolerance)   . HLD (hyperlipidemia)   . HTN (hypertension)   . Impaired glucose tolerance 09/27/2011  . MI (myocardial infarction) ([place])    hx  . Moderate or severe vision impairment, both eyes, impairment level not further specified   . Postherpetic neuralgia   . PSA elevation   . Routine general medical examination at a health care facility   . Shingles   . Skin cancer    hx; non-melanoma  . Special screening for malignant neoplasm of prostate   . Urinary frequency    Past Surgical History:  Procedure Laterality Date  . CORONARY ANGIOPLASTY WITH STENT PLACEMENT    . CORONARY ARTERY BYPASS GRAFT     11+ years ago; has an EF of about 45%, last Myoview showed no ischemia 12/30/06  . [facility]  2011   Dr. Rush Farmer    . right cataract Bilateral 07/2010   with lens implant     reports that he quit smoking about 39 years ago. He has never used smokeless tobacco. He reports that he does not drink alcohol and does not use drugs. family history includes Heart disease in his mother. No Known Allergies Current Outpatient Medications on File Prior to Visit  Medication Sig Dispense Refill  . Alcohol Swabs (B-D SINGLE USE SWABS REGULAR) PADS Use as directed twice daily E11.9 200 each 3  . allopurinol (ZYLOPRIM) 100 MG tablet TAKE 1 TABLET EVERY DAY 90 tablet 3  . aspirin 81 MG EC tablet Take 81 mg by mouth daily.      Marland Kitchen atorvastatin (LIPITOR) 80 MG tablet TAKE 1 TABLET EVERY DAY AT 6:00 PM 90 tablet 3  . bismuth-metronidazole-tetracycline (PYLERA) 140-125-125 MG per capsule Take 3 capsules by mouth 4 (four) times daily -  before meals and at bedtime. 120 capsule 0  . Blood Glucose Calibration (TRUE METRIX LEVEL 1) Low SOLN Use as directed once daily E11.9 3 each 3  . blood glucose meter kit and supplies KIT Use daily to check blood sugar levels. 1 each 0  . carvedilol (COREG) 12.5 MG tablet TAKE 1 TABLET TWICE DAILY WITH MEALS 180 tablet 3  . colchicine 0.6 MG tablet TAKE 1 TABLET EVERY DAY 30 tablet 1  .  HYDROcodone-acetaminophen (NORCO/VICODIN) 5-325 MG tablet Take 1-2 tablets by mouth every 4 (four) hours as needed. 10 tablet 0  . Multiple Vitamins-Minerals (CENTRUM PO) Take 1 tablet by mouth daily.      . nitroGLYCERIN (NITROSTAT) 0.4 MG SL tablet Place 0.4 mg under the tongue every 5 (five) minutes as needed for chest pain (Call 911 at 3rd dose within 15 minutes).    Marland Kitchen spironolactone (ALDACTONE) 25 MG tablet TAKE 1 TABLET EVERY DAY 90 tablet 3  . triamcinolone (NASACORT) 55 MCG/ACT AERO nasal inhaler Place 2 sprays into the nose daily. 1 Inhaler 12  . cetirizine (ZYRTEC) 10 MG tablet Take 1 tablet (10 mg total) by mouth daily. 30 tablet 11   No current facility-administered medications on file prior to visit.    Review of Systems All otherwise neg per pt    Objective:   Physical Exam BP 140/72 (BP Location: Left Arm, Patient Position: Sitting, Cuff Size: Large)   Pulse (!) 57   Temp 97.7 F (36.5 C) (Oral)   Ht 5' 11"  (1.803 m)   Wt 168 lb (76.2 kg)   SpO2 97%   BMI 23.43 kg/m  VS noted,  Constitutional: Pt appears in NAD HENT: Head: NCAT.  Right Ear: External ear normal.  Left Ear: External ear normal.  Eyes: . Pupils are equal, round, and reactive to light. Conjunctivae and EOM are normal Nose: without d/c or deformity Neck: Neck supple. Gross normal ROM Cardiovascular: Normal rate and regular rhythm.   Pulmonary/Chest: Effort normal and breath sounds without rales or wheezing.  Abd:  Soft, NT, ND, + BS, no organomegaly Neurological: Pt is alert. At baseline orientation, motor grossly intact Skin: Skin is warm. No rashes, other new lesions, no LE edema Psychiatric: Pt behavior is normal without agitation  All otherwise neg per pt Lab Results  Component Value Date   WBC 3.9 (L) 10/20/2019   HGB 12.5 (L) 10/20/2019   HCT 37.9 (L) 10/20/2019   PLT 179.0 10/20/2019   GLUCOSE 93 04/26/2020   CHOL 120 10/20/2019   TRIG 68.0 10/20/2019   HDL 41.40 10/20/2019   LDLCALC 65 10/20/2019   ALT 21 04/26/2020   AST 27 04/26/2020   NA 137 04/26/2020   K 5.8 (H) 04/26/2020   CL 105 04/26/2020   CREATININE 1.66 (H) 04/26/2020   BUN 19 04/26/2020   CO2 27 04/26/2020   TSH 3.77 10/20/2019   PSA 4.8 (H) 04/26/2020   HGBA1C 5.8 (H) 04/26/2020   MICROALBUR 0.4 03/24/2012      Assessment & Plan:

## 2020-04-26 NOTE — Patient Instructions (Addendum)
Please continue all other medications as before, and refills have been done if requested.  Please have the pharmacy call with any other refills you may need.  Please continue your efforts at being more active, low cholesterol diet, and weight control.  You are otherwise up to date with prevention measures today.  Please keep your appointments with your specialists as you may have planned  Please go to the LAB at the blood drawing area for the tests to be done  You will be contacted by phone if any changes need to be made immediately.  Otherwise, you will receive a letter about your results with an explanation, but please check with MyChart first.  Please remember to sign up for MyChart if you have not done so, as this will be important to you in the future with finding out test results, communicating by private email, and scheduling acute appointments online when needed.  Please make an Appointment to return for your 1 year visit, or sooner if needed 6

## 2020-04-27 ENCOUNTER — Encounter: Payer: Self-pay | Admitting: Internal Medicine

## 2020-04-27 LAB — HEMOGLOBIN A1C
Hgb A1c MFr Bld: 5.8 % of total Hgb — ABNORMAL HIGH (ref <5.7)
Mean Plasma Glucose: 120 (calc)
eAG (mmol/L): 6.6 (calc)

## 2020-04-27 LAB — PSA: PSA: 4.8 ng/mL — ABNORMAL HIGH (ref <=4.0)

## 2020-04-28 ENCOUNTER — Encounter: Payer: Self-pay | Admitting: Internal Medicine

## 2020-04-28 NOTE — Assessment & Plan Note (Signed)
stable overall by history and exam, recent data reviewed with pt, and pt to continue medical treatment as before,  to f/u any worsening symptoms or concerns  

## 2020-04-28 NOTE — Assessment & Plan Note (Addendum)
stable overall by history and exam, recent data reviewed with pt, and pt to continue medical treatment as before,  to f/u any worsening symptoms or concerns, for f/u psa  I spent 31 minutes in preparing to see the patient by review of recent labs, imaging and procedures, obtaining and reviewing separately obtained history, communicating with the patient and family or caregiver, ordering medications, tests or procedures, and documenting clinical information in the EHR including the differential Dx, treatment, and any further evaluation and other management of increased psa, hyperglycemia, htn, ckd

## 2020-05-29 ENCOUNTER — Other Ambulatory Visit: Payer: Self-pay | Admitting: Internal Medicine

## 2020-05-30 NOTE — Telephone Encounter (Signed)
Please refill as per office routine med refill policy (all routine meds refilled for 3 mo or monthly per pt preference up to one year from last visit, then month to month grace period for 3 mo, then further med refills will have to be denied)  

## 2020-06-02 ENCOUNTER — Ambulatory Visit (INDEPENDENT_AMBULATORY_CARE_PROVIDER_SITE_OTHER): Payer: Medicare HMO

## 2020-06-02 DIAGNOSIS — Z23 Encounter for immunization: Secondary | ICD-10-CM | POA: Diagnosis not present

## 2020-06-06 ENCOUNTER — Other Ambulatory Visit: Payer: Self-pay

## 2020-07-10 DIAGNOSIS — H40023 Open angle with borderline findings, high risk, bilateral: Secondary | ICD-10-CM | POA: Diagnosis not present

## 2020-07-10 DIAGNOSIS — H43813 Vitreous degeneration, bilateral: Secondary | ICD-10-CM | POA: Diagnosis not present

## 2020-07-10 DIAGNOSIS — H04123 Dry eye syndrome of bilateral lacrimal glands: Secondary | ICD-10-CM | POA: Diagnosis not present

## 2020-12-17 ENCOUNTER — Ambulatory Visit: Payer: Medicare HMO | Admitting: Cardiovascular Disease

## 2020-12-17 ENCOUNTER — Encounter: Payer: Self-pay | Admitting: Cardiovascular Disease

## 2020-12-17 ENCOUNTER — Other Ambulatory Visit: Payer: Self-pay

## 2020-12-17 VITALS — BP 138/70 | HR 52 | Ht 71.0 in | Wt 164.0 lb

## 2020-12-17 DIAGNOSIS — E785 Hyperlipidemia, unspecified: Secondary | ICD-10-CM | POA: Diagnosis not present

## 2020-12-17 DIAGNOSIS — I10 Essential (primary) hypertension: Secondary | ICD-10-CM | POA: Diagnosis not present

## 2020-12-17 DIAGNOSIS — I255 Ischemic cardiomyopathy: Secondary | ICD-10-CM | POA: Diagnosis not present

## 2020-12-17 DIAGNOSIS — I25810 Atherosclerosis of coronary artery bypass graft(s) without angina pectoris: Secondary | ICD-10-CM | POA: Diagnosis not present

## 2020-12-17 DIAGNOSIS — I5022 Chronic systolic (congestive) heart failure: Secondary | ICD-10-CM | POA: Diagnosis not present

## 2020-12-17 DIAGNOSIS — N183 Chronic kidney disease, stage 3 unspecified: Secondary | ICD-10-CM | POA: Diagnosis not present

## 2020-12-17 DIAGNOSIS — J449 Chronic obstructive pulmonary disease, unspecified: Secondary | ICD-10-CM | POA: Diagnosis not present

## 2020-12-17 DIAGNOSIS — I110 Hypertensive heart disease with heart failure: Secondary | ICD-10-CM | POA: Diagnosis not present

## 2020-12-17 NOTE — Progress Notes (Signed)
Chief Complaint  Patient presents with  . Follow-up    CAD   History of Present Illness: 80 yo male with history of CAD s/p 4V CABG in 1995, ischemic cardiomyopathy, HTN, HLD, COPD, CKD here today for cardiac follow up. He has CAD dating back to the 1990s with prior PCI and CABG. The last cath report I can find is scanned into EPIC from 1999 and shows failure of the LIMA graft to the LAD and the SVG to the RCA. There was high grade disease in the SVG to the OM and the SVG to the Diagonal. It appears that the LAD was treated with rotablator atherectomy and stenting in 1999. The SVG to the OM and the SVG to the Diagonal were treated with angioplasty. I saw him 07/28/13 and he had c/o exertional chest pains, resolved with rest. Stress myoview 08/17/13 Intermediate risk study with a fixed large, severe inferolateral, basal to mid inferior, and mid to apical anterior perfusion defect. This suggested prior infarction with no significant ischemia. Echo April 2019 with LVEF=40-45%. Mild MR.   He is here today for follow up. The patient denies any chest pain, palpitations, lower extremity edema, orthopnea, PND, dizziness, near syncope or syncope. He has dyspnea with moderate exertion. No change over past year.   Primary Care Physician: Biagio Borg, MD  Past Medical History:  Diagnosis Date  . Alcohol abuse    hx  . Anemia, iron deficiency   . AR (allergic rhinitis)   . BPH (benign prostatic hypertrophy)   . CAD (coronary artery disease) of artery bypass graft   . CHF (congestive heart failure) ([place])   . CKD (chronic kidney disease) 09/30/2011  . COPD (chronic obstructive pulmonary disease) ([place])   . Crohn's disease ([place])   . Dysuria   . ED (erectile dysfunction) of organic origin   . Fatigue   . Glaucoma   . Glucose intolerance (impaired glucose tolerance)   . HLD (hyperlipidemia)   . HTN (hypertension)   . Impaired glucose tolerance 09/27/2011  . MI (myocardial infarction) ([place])    hx  .  Moderate or severe vision impairment, both eyes, impairment level not further specified   . Postherpetic neuralgia   . PSA elevation   . Routine general medical examination at a health care facility   . Shingles   . Skin cancer    hx; non-melanoma  . Special screening for malignant neoplasm of prostate   . Urinary frequency     Past Surgical History:  Procedure Laterality Date  . CORONARY ANGIOPLASTY WITH STENT PLACEMENT    . CORONARY ARTERY BYPASS GRAFT     11+ years ago; has an EF of about 45%, last Myoview showed no ischemia 12/30/06  . [facility]  2011   Dr. Rush Farmer   . right cataract Bilateral 07/2010   with lens implant     Current Outpatient Medications  Medication Sig Dispense Refill  . Alcohol Swabs (B-D SINGLE USE SWABS REGULAR) PADS Use as directed twice daily E11.9 200 each 3  . allopurinol (ZYLOPRIM) 100 MG tablet TAKE 1 TABLET EVERY DAY 90 tablet 3  . aspirin 81 MG EC tablet Take 81 mg by mouth daily.    Marland Kitchen atorvastatin (LIPITOR) 80 MG tablet TAKE 1 TABLET EVERY DAY AT 6:00 PM 90 tablet 3  . bismuth-metronidazole-tetracycline (PYLERA) 140-125-125 MG per capsule Take 3 capsules by mouth 4 (four) times daily -  before meals and at bedtime. 120 capsule 0  . Blood  Glucose Calibration (TRUE METRIX LEVEL 1) Low SOLN Use as directed once daily E11.9 3 each 3  . blood glucose meter kit and supplies KIT Use daily to check blood sugar levels. 1 each 0  . carvedilol (COREG) 12.5 MG tablet TAKE 1 TABLET TWICE DAILY WITH MEALS 180 tablet 3  . colchicine 0.6 MG tablet TAKE 1 TABLET EVERY DAY 60 tablet 3  . HYDROcodone-acetaminophen (NORCO/VICODIN) 5-325 MG tablet Take 1-2 tablets by mouth every 4 (four) hours as needed. 10 tablet 0  . Multiple Vitamins-Minerals (CENTRUM PO) Take 1 tablet by mouth daily.    . nitroGLYCERIN (NITROSTAT) 0.4 MG SL tablet Place 0.4 mg under the tongue every 5 (five) minutes as needed for chest pain (Call 911 at 3rd dose within 15 minutes).    Marland Kitchen spironolactone  (ALDACTONE) 25 MG tablet TAKE 1 TABLET EVERY DAY 90 tablet 3  . triamcinolone (NASACORT) 55 MCG/ACT AERO nasal inhaler Place 2 sprays into the nose daily. 1 Inhaler 12  . cetirizine (ZYRTEC) 10 MG tablet Take 1 tablet (10 mg total) by mouth daily. 30 tablet 11   No current facility-administered medications for this visit.    No Known Allergies  Social History   Socioeconomic History  . Marital status: Married    Spouse name: Not on file  . Number of children: 6  . Years of education: Not on file  . Highest education level: Not on file  Occupational History  . Occupation: Retired  Tobacco Use  . Smoking status: Former Smoker    Quit date: 03/20/1981    Years since quitting: 39.7  . Smokeless tobacco: Never Used  Vaping Use  . Vaping Use: Never used  Substance and Sexual Activity  . Alcohol use: No  . Drug use: No  . Sexual activity: Yes  Other Topics Concern  . Not on file  Social History Narrative   Married, 4 children; retired - Museum/gallery curator.      Designated party release on file. [employer]. 01/15/10.    Social Determinants of Health   Financial Resource Strain: Not on file  Food Insecurity: Not on file  Transportation Needs: Not on file  Physical Activity: Not on file  Stress: Not on file  Social Connections: Not on file  Intimate Partner Violence: Not on file    Family History  Problem Relation Age of Onset  . Heart disease Mother     Review of Systems:  As stated in the HPI and otherwise negative.   BP 138/70   Pulse (!) 52   Ht 5' 11"  (1.803 m)   Wt 164 lb (74.4 kg)   SpO2 97%   BMI 22.87 kg/m   Physical Examination:  General: Well developed, well nourished, NAD  HEENT: OP clear, mucus membranes moist  SKIN: warm, dry. No rashes. Neuro: No focal deficits  Musculoskeletal: Muscle strength 5/5 all ext  Psychiatric: Mood and affect normal  Neck: No JVD, no carotid bruits, no thyromegaly, no lymphadenopathy.  Lungs:Clear bilaterally, no  wheezes, rhonci, crackles Cardiovascular: Regular rate and rhythm. No murmurs, gallops or rubs. Abdomen:Soft. Bowel sounds present. Non-tender.  Extremities: No lower extremity edema. Pulses are 2 + in the bilateral DP/PT.  Echo April 2019: Left ventricle: Septal , apical and distal anterior wall  hypokinesis. The cavity size was mildly dilated. Wall thickness  was increased in a pattern of mild LVH. Systolic function was  mildly to moderately reduced. The estimated ejection fraction was  in the range of 40%  to 45%. Wall motion was normal; there were no  regional wall motion abnormalities. Left ventricular diastolic  function parameters were normal.  - Aortic valve: Calcified non coronary cusp.  - Mitral valve: There was mild regurgitation.  - Atrial septum: No defect or patent foramen ovale was identified.  - Pulmonary arteries: PA peak pressure: 33 mm Hg (S).   EKG:  EKG is ordered today. The ekg ordered today demonstrates Sinus bradycardia, rate 52 bpm  Recent Labs: 04/26/2020: ALT 21; BUN 19; Creat 1.66; Potassium 5.8; Sodium 137   Lipid Panel    Component Value Date/Time   CHOL 120 10/20/2019 0925   TRIG 68.0 10/20/2019 0925   HDL 41.40 10/20/2019 0925   CHOLHDL 3 10/20/2019 0925   VLDL 13.6 10/20/2019 0925   LDLCALC 65 10/20/2019 0925     Wt Readings from Last 3 Encounters:  12/17/20 164 lb (74.4 kg)  04/26/20 168 lb (76.2 kg)  12/16/19 169 lb (76.7 kg)     Other studies Reviewed: Additional studies/ records that were reviewed today include: . Review of the above records demonstrates:    Assessment and Plan:   1. CAD s/p CABG without angina: No chest pain. Will continue ASA, beta blocker and statin.      2. HTN: BP is controlled. No changes  3. Hyperlipidemia: LDL at goal in March 2021. Repeat lipids and LFTs now. Continue statin.   4. Ischemic cardiomyopathy: Last LVEF 45% by echo in 2019. He is not on an Ace -inh or ARB due to renal  insufficiency. Will continue Coreg and aldactone. Repeat echo now.   5. Chronic systolic CHF: His weight is stable. No volume overload on exam.   Current medicines are reviewed at length with the patient today.  The patient does not have concerns regarding medicines.  The following changes have been made:  no change  Labs/ tests ordered today include:   Orders Placed This Encounter  Procedures  . Lipid panel  . Hepatic function panel  . EKG 12-Lead  . ECHOCARDIOGRAM COMPLETE    Disposition:   F/U with me in 12  months  Signed, Lauree Chandler, MD 12/17/2020 3:38 PM    [facility] [place], [place], [place]  [address] Phone: [phone]; Fax: [phone]

## 2020-12-17 NOTE — Patient Instructions (Signed)
Medication Instructions Your physician recommends that you continue on your current medications as directed. Please refer to the Current Medication list given to you today.  *If you need a refill on your cardiac medications before your next appointment, please call your pharmacy*   Lab Work: Lipid and liver the same day as your echo.  You will need to be fasting.  If you have labs (blood work) drawn today and your tests are completely normal, you will receive your results only by: Marland Kitchen MyChart Message (if you have MyChart) OR . A paper copy in the mail If you have any lab test that is abnormal or we need to change your treatment, we will call you to review the results.   Testing/Procedures: Your physician has requested that you have an echocardiogram. Echocardiography is a painless test that uses sound waves to create images of your heart. It provides your doctor with information about the size and shape of your heart and how well your heart's chambers and valves are working. This procedure takes approximately one hour. There are no restrictions for this procedure.     Follow-Up: At [facility], you and your health needs are our priority.  As part of our continuing mission to provide you with exceptional heart care, we have created designated Provider Care Teams.  These Care Teams include your primary Cardiologist (physician) and Advanced Practice Providers (APPs -  Physician Assistants and Nurse Practitioners) who all work together to provide you with the care you need, when you need it.  We recommend signing up for the patient portal called "MyChart".  Sign up information is provided on this After Visit Summary.  MyChart is used to connect with patients for Virtual Visits (Telemedicine).  Patients are able to view lab/test results, encounter notes, upcoming appointments, etc.  Non-urgent messages can be sent to your provider as well.   To learn more about what you can do with MyChart, go  to [email or web address].    Your next appointment:   1 year(s)  The format for your next appointment:   In Person  Provider:   You may see Lauree Chandler, MD or one of the following Advanced Practice Providers on your designated Care Team:    Melina Copa, PA-C  Ermalinda Barrios, PA-C    Other Instructions

## 2021-01-16 ENCOUNTER — Other Ambulatory Visit: Payer: Medicare HMO

## 2021-01-16 ENCOUNTER — Ambulatory Visit (HOSPITAL_COMMUNITY): Payer: Medicare HMO | Attending: Internal Medicine

## 2021-01-16 ENCOUNTER — Other Ambulatory Visit: Payer: Self-pay

## 2021-01-16 DIAGNOSIS — E785 Hyperlipidemia, unspecified: Secondary | ICD-10-CM | POA: Diagnosis not present

## 2021-01-16 DIAGNOSIS — I255 Ischemic cardiomyopathy: Secondary | ICD-10-CM | POA: Diagnosis not present

## 2021-01-16 LAB — HEPATIC FUNCTION PANEL
ALT: 15 IU/L (ref 0–44)
AST: 21 IU/L (ref 0–40)
Albumin: 4.4 g/dL (ref 3.7–4.7)
Alkaline Phosphatase: 74 IU/L (ref 44–121)
Bilirubin Total: 0.9 mg/dL (ref 0.0–1.2)
Bilirubin, Direct: 0.23 mg/dL (ref 0.00–0.40)
Total Protein: 7.8 g/dL (ref 6.0–8.5)

## 2021-01-16 LAB — LIPID PANEL
Chol/HDL Ratio: 3.3 ratio (ref 0.0–5.0)
Cholesterol, Total: 139 mg/dL (ref 100–199)
HDL: 42 mg/dL (ref >39)
LDL Chol Calc (NIH): 85 mg/dL (ref 0–99)
Triglycerides: 58 mg/dL (ref 0–149)
VLDL Cholesterol Cal: 12 mg/dL (ref 5–40)

## 2021-01-16 LAB — ECHOCARDIOGRAM COMPLETE
Area-P 1/2: 3.13 cm2
S' Lateral: 3.4 cm

## 2021-01-16 MED ORDER — PERFLUTREN LIPID MICROSPHERE
1.0000 mL | INTRAVENOUS | Status: AC | PRN
  Administered 2021-01-16: 1 mL via INTRAVENOUS

## 2021-01-17 ENCOUNTER — Other Ambulatory Visit: Payer: Self-pay | Admitting: *Deleted

## 2021-01-17 DIAGNOSIS — E785 Hyperlipidemia, unspecified: Secondary | ICD-10-CM

## 2021-01-17 NOTE — Telephone Encounter (Signed)
Left message of recommendation to add Zetia and repeat lipids in 12 weeks.  Adv to call back to confirm he received message and confirm pharmacy.

## 2021-01-17 NOTE — Telephone Encounter (Signed)
-----   Message from Burnell Blanks, MD sent at 01/16/2021  7:16 PM EDT ----- LDL near goal but not at goal of 70. I would suggest adding Zetia 10 mg daily to the statin and repeat lipids in 12 weeks. cdm

## 2021-01-18 MED ORDER — EZETIMIBE 10 MG PO TABS: 10.0000 mg | ORAL_TABLET | Freq: Every day | ORAL | 3 refills | Status: DC

## 2021-01-18 NOTE — Telephone Encounter (Signed)
    Pt's wife returning call, she verified pharmacy to send all prescription to [facility] mail order pharmacy

## 2021-02-11 ENCOUNTER — Other Ambulatory Visit: Payer: Self-pay | Admitting: Internal Medicine

## 2021-02-11 NOTE — Telephone Encounter (Signed)
Please refill as per office routine med refill policy (all routine meds refilled for 3 mo or monthly per pt preference up to one year from last visit, then month to month grace period for 3 mo, then further med refills will have to be denied)  

## 2021-02-27 ENCOUNTER — Other Ambulatory Visit: Payer: Self-pay | Admitting: Internal Medicine

## 2021-02-27 NOTE — Telephone Encounter (Signed)
Please refill as per office routine med refill policy (all routine meds refilled for 3 mo or monthly per pt preference up to one year from last visit, then month to month grace period for 3 mo, then further med refills will have to be denied)  

## 2021-04-29 ENCOUNTER — Ambulatory Visit (INDEPENDENT_AMBULATORY_CARE_PROVIDER_SITE_OTHER): Payer: Medicare HMO | Admitting: Internal Medicine

## 2021-04-29 ENCOUNTER — Encounter: Payer: Medicare HMO | Admitting: Internal Medicine

## 2021-04-29 ENCOUNTER — Other Ambulatory Visit: Payer: Self-pay

## 2021-04-29 ENCOUNTER — Encounter: Payer: Self-pay | Admitting: Internal Medicine

## 2021-04-29 VITALS — BP 136/76 | HR 54 | Temp 98.1°F | Ht 71.0 in | Wt 165.0 lb

## 2021-04-29 DIAGNOSIS — E7800 Pure hypercholesterolemia, unspecified: Secondary | ICD-10-CM

## 2021-04-29 DIAGNOSIS — R972 Elevated prostate specific antigen [PSA]: Secondary | ICD-10-CM | POA: Diagnosis not present

## 2021-04-29 DIAGNOSIS — E559 Vitamin D deficiency, unspecified: Secondary | ICD-10-CM

## 2021-04-29 DIAGNOSIS — Z0001 Encounter for general adult medical examination with abnormal findings: Secondary | ICD-10-CM | POA: Diagnosis not present

## 2021-04-29 DIAGNOSIS — Z23 Encounter for immunization: Secondary | ICD-10-CM

## 2021-04-29 DIAGNOSIS — H6123 Impacted cerumen, bilateral: Secondary | ICD-10-CM | POA: Diagnosis not present

## 2021-04-29 DIAGNOSIS — I10 Essential (primary) hypertension: Secondary | ICD-10-CM

## 2021-04-29 DIAGNOSIS — E538 Deficiency of other specified B group vitamins: Secondary | ICD-10-CM

## 2021-04-29 DIAGNOSIS — N1831 Chronic kidney disease, stage 3a: Secondary | ICD-10-CM

## 2021-04-29 DIAGNOSIS — R7302 Impaired glucose tolerance (oral): Secondary | ICD-10-CM | POA: Diagnosis not present

## 2021-04-29 DIAGNOSIS — K5090 Crohn's disease, unspecified, without complications: Secondary | ICD-10-CM | POA: Diagnosis not present

## 2021-04-29 LAB — CBC WITH DIFFERENTIAL/PLATELET
Basophils Absolute: 0.1 10*3/uL (ref 0.0–0.1)
Basophils Relative: 1.7 % (ref 0.0–3.0)
Eosinophils Absolute: 0.2 10*3/uL (ref 0.0–0.7)
Eosinophils Relative: 4.4 % (ref 0.0–5.0)
HCT: 36.8 % — ABNORMAL LOW (ref 39.0–52.0)
Hemoglobin: 12.1 g/dL — ABNORMAL LOW (ref 13.0–17.0)
Lymphocytes Relative: 25.7 % (ref 12.0–46.0)
Lymphs Abs: 1 10*3/uL (ref 0.7–4.0)
MCHC: 32.8 g/dL (ref 30.0–36.0)
MCV: 88.1 fl (ref 78.0–100.0)
Monocytes Absolute: 0.5 10*3/uL (ref 0.1–1.0)
Monocytes Relative: 13.3 % — ABNORMAL HIGH (ref 3.0–12.0)
Neutro Abs: 2.1 10*3/uL (ref 1.4–7.7)
Neutrophils Relative %: 54.9 % (ref 43.0–77.0)
Platelets: 155 10*3/uL (ref 150.0–400.0)
RBC: 4.17 Mil/uL — ABNORMAL LOW (ref 4.22–5.81)
RDW: 15 % (ref 11.5–15.5)
WBC: 3.9 10*3/uL — ABNORMAL LOW (ref 4.0–10.5)

## 2021-04-29 LAB — BASIC METABOLIC PANEL
BUN: 22 mg/dL (ref 6–23)
CO2: 27 mEq/L (ref 19–32)
Calcium: 9.5 mg/dL (ref 8.4–10.5)
Chloride: 106 mEq/L (ref 96–112)
Creatinine, Ser: 1.43 mg/dL (ref 0.40–1.50)
GFR: 46.26 mL/min — ABNORMAL LOW (ref >60.00)
Glucose, Bld: 91 mg/dL (ref 70–99)
Potassium: 4.1 mEq/L (ref 3.5–5.1)
Sodium: 139 mEq/L (ref 135–145)

## 2021-04-29 LAB — HEPATIC FUNCTION PANEL
ALT: 21 U/L (ref 0–53)
AST: 25 U/L (ref 0–37)
Albumin: 4.1 g/dL (ref 3.5–5.2)
Alkaline Phosphatase: 62 U/L (ref 39–117)
Bilirubin, Direct: 0.2 mg/dL (ref 0.0–0.3)
Total Bilirubin: 1.3 mg/dL — ABNORMAL HIGH (ref 0.2–1.2)
Total Protein: 7.4 g/dL (ref 6.0–8.3)

## 2021-04-29 LAB — LIPID PANEL
Cholesterol: 112 mg/dL (ref 0–200)
HDL: 40.8 mg/dL (ref >39.00)
LDL Cholesterol: 62 mg/dL (ref 0–99)
NonHDL: 71.42
Total CHOL/HDL Ratio: 3
Triglycerides: 49 mg/dL (ref 0.0–149.0)
VLDL: 9.8 mg/dL (ref 0.0–40.0)

## 2021-04-29 LAB — PSA: PSA: 4.93 ng/mL — ABNORMAL HIGH (ref 0.10–4.00)

## 2021-04-29 LAB — URINALYSIS, ROUTINE W REFLEX MICROSCOPIC
Bilirubin Urine: NEGATIVE
Hgb urine dipstick: NEGATIVE
Ketones, ur: NEGATIVE
Leukocytes,Ua: NEGATIVE
Nitrite: NEGATIVE
RBC / HPF: NONE SEEN (ref 0–?)
Specific Gravity, Urine: 1.015 (ref 1.000–1.030)
Total Protein, Urine: NEGATIVE
Urine Glucose: NEGATIVE
Urobilinogen, UA: 0.2 (ref 0.0–1.0)
pH: 5.5 (ref 5.0–8.0)

## 2021-04-29 LAB — HEMOGLOBIN A1C: Hgb A1c MFr Bld: 6.1 % (ref 4.6–6.5)

## 2021-04-29 LAB — VITAMIN B12: Vitamin B-12: 457 pg/mL (ref 211–911)

## 2021-04-29 LAB — VITAMIN D 25 HYDROXY (VIT D DEFICIENCY, FRACTURES): VITD: 29.98 ng/mL — ABNORMAL LOW (ref 30.00–100.00)

## 2021-04-29 LAB — TSH: TSH: 4.05 u[IU]/mL (ref 0.35–5.50)

## 2021-04-29 NOTE — Patient Instructions (Addendum)
You had the flu shot today  Please consider having the Novavax covid vaccine next month instead of further covid boosters  Your ears were cleared of wax on both sides today  Please continue all other medications as before, and refills have been done if requested.  Please have the pharmacy call with any other refills you may need.  Please continue your efforts at being more active, low cholesterol diet, and weight control.  You are otherwise up to date with prevention measures today.  Please keep your appointments with your specialists as you may have planned  Please go to the LAB at the blood drawing area for the tests to be done  You will be contacted by phone if any changes need to be made immediately.  Otherwise, you will receive a letter about your results with an explanation, but please check with MyChart first.  Please remember to sign up for MyChart if you have not done so, as this will be important to you in the future with finding out test results, communicating by private email, and scheduling acute appointments online when needed.  Please make an Appointment to return in 6 months, or sooner if needed, to keep a better check on the kidney function

## 2021-04-29 NOTE — Progress Notes (Signed)
Patient ID: Gary Levy, male   DOB: February 01, 1941, 80 y.o.   MRN: [id number]         Chief Complaint:: wellness exam and bilateral hearing loss, elevated psa, ckd, hyperglycemia, hld, htn       HPI:  Gary Levy is a 80 y.o. male here for wellness exam; declines covid booster, shingrix and tdap for now, o/w up to date with preventive referrals and immunizations                        Also Pt denies chest pain, increased sob or doe, wheezing, orthopnea, PND, increased LE swelling, palpitations, dizziness or syncope.   Pt denies polydipsia, polyuria, or new focal neuro s/s.   Pt denies fever, wt loss, night sweats, loss of appetite, or other constitutional symptoms  Denies urinary symptoms such as dysuria, frequency, urgency, flank pain, hematuria or n/v, fever, chills.  Does have 1 wk bilateral hearing reduction loss thinks maybe wax again, without fever, pain, dizziness, HA or tinnitus. No other new complaints  not taking Vit D Wt Readings from Last 3 Encounters:  04/29/21 165 lb (74.8 kg)  12/17/20 164 lb (74.4 kg)  04/26/20 168 lb (76.2 kg)   BP Readings from Last 3 Encounters:  04/29/21 136/76  12/17/20 138/70  04/26/20 140/72   Immunization History  Administered Date(s) Administered   Fluad Quad(high Dose 65+) 04/15/2019, 06/02/2020, 04/29/2021   Influenza Split 05/05/2012   Influenza Whole 08/15/2009, 06/11/2010   Influenza, High Dose Seasonal PF 06/07/2013, 04/12/2015, 07/01/2016, 04/14/2017, 04/16/2018   Influenza,inj,Quad PF,6+ Mos 04/11/2014   PFIZER(Purple Top)SARS-COV-2 Vaccination 09/23/2019, 10/14/2019, 05/28/2020   Pneumococcal Conjugate-13 10/12/2013   Pneumococcal Polysaccharide-23 08/19/1995, 08/15/2009   Td 08/19/1995, 08/15/2009   There are no preventive care reminders to display for this patient.     Past Medical History:  Diagnosis Date   Alcohol abuse    hx   Anemia, iron deficiency    AR (allergic rhinitis)    BPH (benign prostatic hypertrophy)     CAD (coronary artery disease) of artery bypass graft    CHF (congestive heart failure) ([facility])    CKD (chronic kidney disease) 09/30/2011   COPD (chronic obstructive pulmonary disease) ([facility])    Crohn's disease ([place])    Dysuria    ED (erectile dysfunction) of organic origin    Fatigue    Glaucoma    Glucose intolerance (impaired glucose tolerance)    HLD (hyperlipidemia)    HTN (hypertension)    Impaired glucose tolerance 09/27/2011   MI (myocardial infarction) ([facility])    hx   Moderate or severe vision impairment, both eyes, impairment level not further specified    Postherpetic neuralgia    PSA elevation    Routine general medical examination at a health care facility    Shingles    Skin cancer    hx; non-melanoma   Special screening for malignant neoplasm of prostate    Urinary frequency    Past Surgical History:  Procedure Laterality Date   CORONARY ANGIOPLASTY WITH STENT PLACEMENT     CORONARY ARTERY BYPASS GRAFT     11+ years ago; has an EF of about 45%, last Myoview showed no ischemia 12/30/06   [facility]  2011   Dr. Rush Farmer    right cataract Bilateral 07/2010   with lens implant     reports that he quit smoking about 40 years ago. His smoking use included cigarettes. He has never used smokeless tobacco. He reports  that he does not drink alcohol and does not use drugs. family history includes Heart disease in his mother. No Known Allergies Current Outpatient Medications on File Prior to Visit  Medication Sig Dispense Refill   Alcohol Swabs (B-D SINGLE USE SWABS REGULAR) PADS Use as directed twice daily E11.9 200 each 3   allopurinol (ZYLOPRIM) 100 MG tablet TAKE 1 TABLET EVERY DAY 90 tablet 3   aspirin 81 MG EC tablet Take 81 mg by mouth daily.     atorvastatin (LIPITOR) 80 MG tablet TAKE 1 TABLET EVERY DAY AT 6:00 PM 90 tablet 3   bismuth-metronidazole-tetracycline (PYLERA) 140-125-125 MG per capsule Take 3 capsules by mouth 4 (four) times daily -  before meals and at bedtime.  120 capsule 0   Blood Glucose Calibration (TRUE METRIX LEVEL 1) Low SOLN Use as directed once daily E11.9 3 each 3   blood glucose meter kit and supplies KIT Use daily to check blood sugar levels. 1 each 0   carvedilol (COREG) 12.5 MG tablet TAKE 1 TABLET TWICE DAILY WITH MEALS 180 tablet 3   colchicine 0.6 MG tablet TAKE 1 TABLET EVERY DAY 60 tablet 3   ezetimibe (ZETIA) 10 MG tablet Take 1 tablet (10 mg total) by mouth daily. 90 tablet 3   HYDROcodone-acetaminophen (NORCO/VICODIN) 5-325 MG tablet Take 1-2 tablets by mouth every 4 (four) hours as needed. 10 tablet 0   Multiple Vitamins-Minerals (CENTRUM PO) Take 1 tablet by mouth daily.     nitroGLYCERIN (NITROSTAT) 0.4 MG SL tablet Place 0.4 mg under the tongue every 5 (five) minutes as needed for chest pain (Call 911 at 3rd dose within 15 minutes).     spironolactone (ALDACTONE) 25 MG tablet TAKE 1 TABLET EVERY DAY 90 tablet 0   triamcinolone (NASACORT) 55 MCG/ACT AERO nasal inhaler Place 2 sprays into the nose daily. 1 Inhaler 12   cetirizine (ZYRTEC) 10 MG tablet Take 1 tablet (10 mg total) by mouth daily. 30 tablet 11   No current facility-administered medications on file prior to visit.        ROS:  All others reviewed and negative.  Objective        PE:  BP 136/76 (BP Location: Right Arm, Patient Position: Sitting, Cuff Size: Normal)   Pulse (!) 54   Temp 98.1 F (36.7 C) (Oral)   Ht _0  (1.803 m)   Wt 165 lb (74.8 kg)   SpO2 98%   BMI 23.01 kg/m                 Constitutional: Pt appears in NAD               HENT: Head: NCAT.                Right Ear: External ear normal.                 Left Ear: External ear normal.                Eyes: . Pupils are equal, round, and reactive to light. Conjunctivae and EOM are normal               Nose: without d/c or deformity               Neck: Neck supple. Gross normal ROM               Cardiovascular: Normal rate and regular rhythm.  Pulmonary/Chest: Effort  normal and breath sounds without rales or wheezing.                Abd:  Soft, NT, ND, + BS, no organomegaly               Neurological: Pt is alert. At baseline orientation, motor grossly intact               Skin: Skin is warm. No rashes, no other new lesions, LE edema - none               Psychiatric: Pt behavior is normal without agitation   Micro: none  Cardiac tracings I have personally interpreted today:  none  Pertinent Radiological findings (summarize): none   Lab Results  Component Value Date   WBC 3.9 (L) 04/29/2021   HGB 12.1 (L) 04/29/2021   HCT 36.8 (L) 04/29/2021   PLT 155.0 04/29/2021   GLUCOSE 91 04/29/2021   CHOL 112 04/29/2021   TRIG 49.0 04/29/2021   HDL 40.80 04/29/2021   LDLCALC 62 04/29/2021   ALT 21 04/29/2021   AST 25 04/29/2021   NA 139 04/29/2021   K 4.1 04/29/2021   CL 106 04/29/2021   CREATININE 1.43 04/29/2021   BUN 22 04/29/2021   CO2 27 04/29/2021   TSH 4.05 04/29/2021   PSA 4.93 (H) 04/29/2021   HGBA1C 6.1 04/29/2021   MICROALBUR 0.4 03/24/2012   Assessment/Plan:  Gary Levy is a 80 y.o. Black or African American [2] male with  has a past medical history of Alcohol abuse, Anemia, iron deficiency, AR (allergic rhinitis), BPH (benign prostatic hypertrophy), CAD (coronary artery disease) of artery bypass graft, CHF (congestive heart failure) ([place]), CKD (chronic kidney disease) (09/30/2011), COPD (chronic obstructive pulmonary disease) ([place]), Crohn's disease ([place]), Dysuria, ED (erectile dysfunction) of organic origin, Fatigue, Glaucoma, Glucose intolerance (impaired glucose tolerance), HLD (hyperlipidemia), HTN (hypertension), Impaired glucose tolerance (09/27/2011), MI (myocardial infarction) ([place]), Moderate or severe vision impairment, both eyes, impairment level not further specified, Postherpetic neuralgia, PSA elevation, Routine general medical examination at a health care facility, Shingles, Skin cancer, Special screening for malignant neoplasm  of prostate, and Urinary frequency.  Encounter for well adult exam with abnormal findings Age and sex appropriate education and counseling updated with regular exercise and diet Referrals for preventative services - none needed Immunizations addressed - decleines covid booster, shingrix, tdap Smoking counseling  - none needed Evidence for depression or other mood disorder - none significant Most recent labs reviewed. I have personally reviewed and have noted: 1) the patient's medical and social history 2) The patient's current medications and supplements 3) The patient's height, weight, and BMI have been recorded in the chart   PSA, INCREASED Asmpt, for f/u psa with labs  Impaired glucose tolerance Lab Results  Component Value Date   HGBA1C 6.1 04/29/2021   Stable, pt to continue current medical treatment  - diet   Hyperlipidemia Lab Results  Component Value Date   LDLCALC 62 04/29/2021   Stable, pt to continue current statin lipitor   Essential hypertension BP Readings from Last 3 Encounters:  04/29/21 136/76  12/17/20 138/70  04/26/20 140/72   Stable, pt to continue medical treatment coreg, aldactone   CKD (chronic kidney disease) Lab Results  Component Value Date   CREATININE 1.43 04/29/2021   Stable overall, cont to avoid nephrotoxins   Bilateral hearing loss due to cerumen impaction Resolved with irrigation  Ceruminosis is noted.  Wax is removed by syringing  and manual debridement. Instructions for home care to prevent wax buildup are given.   Vitamin D deficiency Last vitamin D Lab Results  Component Value Date   VD25OH 29.98 (L) 04/29/2021   Low, to start oral replacement  Followup: No follow-ups on file.  Cathlean Cower, MD 05/05/2021 4:37 PM [facility]'

## 2021-05-05 DIAGNOSIS — E559 Vitamin D deficiency, unspecified: Secondary | ICD-10-CM | POA: Insufficient documentation

## 2021-05-05 NOTE — Assessment & Plan Note (Signed)
Resolved with irrigation  Ceruminosis is noted.  Wax is removed by syringing and manual debridement. Instructions for home care to prevent wax buildup are given.  

## 2021-05-05 NOTE — Assessment & Plan Note (Signed)
Asmpt, for f/u psa with labs

## 2021-05-05 NOTE — Assessment & Plan Note (Signed)
BP Readings from Last 3 Encounters:  04/29/21 136/76  12/17/20 138/70  04/26/20 140/72   Stable, pt to continue medical treatment coreg, aldactone

## 2021-05-05 NOTE — Assessment & Plan Note (Signed)
Lab Results  Component Value Date   CREATININE 1.43 04/29/2021   Stable overall, cont to avoid nephrotoxins

## 2021-05-05 NOTE — Assessment & Plan Note (Signed)
Last vitamin D Lab Results  Component Value Date   VD25OH 29.98 (L) 04/29/2021   Low, to start oral replacement

## 2021-05-05 NOTE — Assessment & Plan Note (Signed)
Lab Results  Component Value Date   HGBA1C 6.1 04/29/2021   Stable, pt to continue current medical treatment  - diet

## 2021-05-05 NOTE — Assessment & Plan Note (Signed)
Age and sex appropriate education and counseling updated with regular exercise and diet Referrals for preventative services - none needed Immunizations addressed - decleines covid booster, shingrix, tdap Smoking counseling  - none needed Evidence for depression or other mood disorder - none significant Most recent labs reviewed. I have personally reviewed and have noted: 1) the patient's medical and social history 2) The patient's current medications and supplements 3) The patient's height, weight, and BMI have been recorded in the chart

## 2021-05-05 NOTE — Assessment & Plan Note (Signed)
Lab Results  Component Value Date   LDLCALC 62 04/29/2021   Stable, pt to continue current statin lipitor

## 2021-06-07 ENCOUNTER — Other Ambulatory Visit: Payer: Self-pay

## 2021-06-07 ENCOUNTER — Ambulatory Visit (INDEPENDENT_AMBULATORY_CARE_PROVIDER_SITE_OTHER): Payer: Medicare HMO | Admitting: Internal Medicine

## 2021-06-07 ENCOUNTER — Encounter: Payer: Self-pay | Admitting: Internal Medicine

## 2021-06-07 VITALS — BP 136/60 | HR 59 | Ht 71.0 in | Wt 166.4 lb

## 2021-06-07 DIAGNOSIS — L02214 Cutaneous abscess of groin: Secondary | ICD-10-CM | POA: Diagnosis not present

## 2021-06-07 DIAGNOSIS — J449 Chronic obstructive pulmonary disease, unspecified: Secondary | ICD-10-CM | POA: Diagnosis not present

## 2021-06-07 DIAGNOSIS — R7302 Impaired glucose tolerance (oral): Secondary | ICD-10-CM

## 2021-06-07 DIAGNOSIS — I10 Essential (primary) hypertension: Secondary | ICD-10-CM

## 2021-06-07 DIAGNOSIS — K5090 Crohn's disease, unspecified, without complications: Secondary | ICD-10-CM | POA: Diagnosis not present

## 2021-06-07 MED ORDER — TRAMADOL HCL 50 MG PO TABS: 50.0000 mg | ORAL_TABLET | Freq: Four times a day (QID) | ORAL | 0 refills | Status: AC | PRN

## 2021-06-07 MED ORDER — SULFAMETHOXAZOLE-TRIMETHOPRIM 800-160 MG PO TABS: 1.0000 | ORAL_TABLET | Freq: Two times a day (BID) | ORAL | 0 refills | Status: DC

## 2021-06-07 NOTE — Assessment & Plan Note (Signed)
Stable overall, cont current med tx - declines need for inahaler prn

## 2021-06-07 NOTE — Assessment & Plan Note (Signed)
I suspect possible infect cyst or other abscess/cellulitis of the right inguinal area with some slight extension to the right lower quad; ok for septra ds, pain control, and urgent general surgury referral

## 2021-06-07 NOTE — Patient Instructions (Signed)
Please take all new medication as prescribed - the antibiotic, and pain medication as needed  You will be contacted regarding the referral for: General Surgury  Please continue all other medications as before, and refills have been done if requested.  Please have the pharmacy call with any other refills you may need.  Please keep your appointments with your specialists as you may have planned

## 2021-06-07 NOTE — Assessment & Plan Note (Signed)
Lab Results  Component Value Date   HGBA1C 6.1 04/29/2021   Stable, pt to continue current medical treatment  - diet

## 2021-06-07 NOTE — Progress Notes (Signed)
Patient ID: Gary Levy, male   DOB: 1941/05/31, 80 y.o.   MRN: [id number]        Chief Complaint: follow up right groin pain, swelling, drainage       HPI:  Gary Levy is a 80 y.o. male here overall ok except for 5 days onset right groin pain, swelling and intermittent grayish material d/c at the crease of the right groin, but seems to be spreading to the right lower abdomen and overall enlarging.  He manipulated it and got a kind of large amount of grayish material per pt 3 days ago  No fever, chills, and overall o/w doing ok.  Pt denies chest pain, increased sob or doe, wheezing, orthopnea, PND, increased LE swelling, palpitations, dizziness or syncope.   Pt denies polydipsia, polyuria.      Wt Readings from Last 3 Encounters:  06/07/21 166 lb 6.4 oz (75.5 kg)  04/29/21 165 lb (74.8 kg)  12/17/20 164 lb (74.4 kg)   BP Readings from Last 3 Encounters:  06/07/21 136/60  04/29/21 136/76  12/17/20 138/70         Past Medical History:  Diagnosis Date   Alcohol abuse    hx   Anemia, iron deficiency    AR (allergic rhinitis)    BPH (benign prostatic hypertrophy)    CAD (coronary artery disease) of artery bypass graft    CHF (congestive heart failure) (HCC)    CKD (chronic kidney disease) 09/30/2011   COPD (chronic obstructive pulmonary disease) (HCC)    Crohn's disease ([place])    Dysuria    ED (erectile dysfunction) of organic origin    Fatigue    Glaucoma    Glucose intolerance (impaired glucose tolerance)    HLD (hyperlipidemia)    HTN (hypertension)    Impaired glucose tolerance 09/27/2011   MI (myocardial infarction) (HCC)    hx   Moderate or severe vision impairment, both eyes, impairment level not further specified    Postherpetic neuralgia    PSA elevation    Routine general medical examination at a health care facility    Shingles    Skin cancer    hx; non-melanoma   Special screening for malignant neoplasm of prostate    Urinary frequency    Past Surgical  History:  Procedure Laterality Date   CORONARY ANGIOPLASTY WITH STENT PLACEMENT     CORONARY ARTERY BYPASS GRAFT     11+ years ago; has an EF of about 45%, last Myoview showed no ischemia 12/30/06   [facility]  2011   Dr. Rush Farmer    right cataract Bilateral 07/2010   with lens implant     reports that he quit smoking about 40 years ago. His smoking use included cigarettes. He has never used smokeless tobacco. He reports that he does not drink alcohol and does not use drugs. family history includes Heart disease in his mother. No Known Allergies Current Outpatient Medications on File Prior to Visit  Medication Sig Dispense Refill   Alcohol Swabs (B-D SINGLE USE SWABS REGULAR) PADS Use as directed twice daily E11.9 200 each 3   allopurinol (ZYLOPRIM) 100 MG tablet TAKE 1 TABLET EVERY DAY 90 tablet 3   aspirin 81 MG EC tablet Take 81 mg by mouth daily.     atorvastatin (LIPITOR) 80 MG tablet TAKE 1 TABLET EVERY DAY AT 6:00 PM 90 tablet 3   bismuth-metronidazole-tetracycline (PYLERA) 140-125-125 MG per capsule Take 3 capsules by mouth 4 (four) times daily -  before meals  and at bedtime. 120 capsule 0   Blood Glucose Calibration (TRUE METRIX LEVEL 1) Low SOLN Use as directed once daily E11.9 3 each 3   blood glucose meter kit and supplies KIT Use daily to check blood sugar levels. 1 each 0   carvedilol (COREG) 12.5 MG tablet TAKE 1 TABLET TWICE DAILY WITH MEALS 180 tablet 3   colchicine 0.6 MG tablet TAKE 1 TABLET EVERY DAY 60 tablet 3   ezetimibe (ZETIA) 10 MG tablet Take 1 tablet (10 mg total) by mouth daily. 90 tablet 3   HYDROcodone-acetaminophen (NORCO/VICODIN) 5-325 MG tablet Take 1-2 tablets by mouth every 4 (four) hours as needed. 10 tablet 0   Multiple Vitamins-Minerals (CENTRUM PO) Take 1 tablet by mouth daily.     nitroGLYCERIN (NITROSTAT) 0.4 MG SL tablet Place 0.4 mg under the tongue every 5 (five) minutes as needed for chest pain (Call 911 at 3rd dose within 15 minutes).      spironolactone (ALDACTONE) 25 MG tablet TAKE 1 TABLET EVERY DAY 90 tablet 0   triamcinolone (NASACORT) 55 MCG/ACT AERO nasal inhaler Place 2 sprays into the nose daily. 1 Inhaler 12   cetirizine (ZYRTEC) 10 MG tablet Take 1 tablet (10 mg total) by mouth daily. 30 tablet 11   No current facility-administered medications on file prior to visit.        ROS:  All others reviewed and negative.  Objective        PE:  BP 136/60 (BP Location: Right Arm, Patient Position: Sitting, Cuff Size: Large)   Pulse (!) 59   Ht 5' 11"  (1.803 m)   Wt 166 lb 6.4 oz (75.5 kg)   SpO2 97%   BMI 23.21 kg/m                 Constitutional: Pt appears in NAD               HENT: Head: NCAT.                Right Ear: External ear normal.                 Left Ear: External ear normal.                Eyes: . Pupils are equal, round, and reactive to light. Conjunctivae and EOM are normal               Nose: without d/c or deformity               Neck: Neck supple. Gross normal ROM               Cardiovascular: Normal rate and regular rhythm.                 Pulmonary/Chest: Effort normal and breath sounds without rales or wheezing.                Abd:  Soft, NT, ND, + BS, no organomegaly               Neurological: Pt is alert. At baseline orientation, motor grossly intact               Skin: LE edema - none; right inguinal ligament area with 2.5 cm oval area red, tender, swelling with fluctuance               Psychiatric: Pt behavior is normal without agitation   Micro: none  Cardiac tracings I have  personally interpreted today:  none  Pertinent Radiological findings (summarize): none   Lab Results  Component Value Date   WBC 3.9 (L) 04/29/2021   HGB 12.1 (L) 04/29/2021   HCT 36.8 (L) 04/29/2021   PLT 155.0 04/29/2021   GLUCOSE 91 04/29/2021   CHOL 112 04/29/2021   TRIG 49.0 04/29/2021   HDL 40.80 04/29/2021   LDLCALC 62 04/29/2021   ALT 21 04/29/2021   AST 25 04/29/2021   NA 139 04/29/2021   K  4.1 04/29/2021   CL 106 04/29/2021   CREATININE 1.43 04/29/2021   BUN 22 04/29/2021   CO2 27 04/29/2021   TSH 4.05 04/29/2021   PSA 4.93 (H) 04/29/2021   HGBA1C 6.1 04/29/2021   MICROALBUR 0.4 03/24/2012   Assessment/Plan:  Gary Levy is a 80 y.o. Black or African American [2] male with  has a past medical history of Alcohol abuse, Anemia, iron deficiency, AR (allergic rhinitis), BPH (benign prostatic hypertrophy), CAD (coronary artery disease) of artery bypass graft, CHF (congestive heart failure) ([place]), CKD (chronic kidney disease) (09/30/2011), COPD (chronic obstructive pulmonary disease) ([place]), Crohn's disease ([place]), Dysuria, ED (erectile dysfunction) of organic origin, Fatigue, Glaucoma, Glucose intolerance (impaired glucose tolerance), HLD (hyperlipidemia), HTN (hypertension), Impaired glucose tolerance (09/27/2011), MI (myocardial infarction) ([place]), Moderate or severe vision impairment, both eyes, impairment level not further specified, Postherpetic neuralgia, PSA elevation, Routine general medical examination at a health care facility, Shingles, Skin cancer, Special screening for malignant neoplasm of prostate, and Urinary frequency.  Abscess of groin, right I suspect possible infect cyst or other abscess/cellulitis of the right inguinal area with some slight extension to the right lower quad; ok for septra ds, pain control, and urgent general surgury referral  COPD (chronic obstructive pulmonary disease) (HCC) Stable overall, cont current med tx - declines need for inahaler prn  Essential hypertension BP Readings from Last 3 Encounters:  06/07/21 136/60  04/29/21 136/76  12/17/20 138/70   Stable, pt to continue medical treatment coreg   Impaired glucose tolerance Lab Results  Component Value Date   HGBA1C 6.1 04/29/2021   Stable, pt to continue current medical treatment  - diet  Followup: Return if symptoms worsen or fail to improve.  Cathlean Cower, MD 06/07/2021 1:15  PM [facility]

## 2021-06-07 NOTE — Assessment & Plan Note (Signed)
BP Readings from Last 3 Encounters:  06/07/21 136/60  04/29/21 136/76  12/17/20 138/70   Stable, pt to continue medical treatment coreg

## 2021-06-11 DIAGNOSIS — L02214 Cutaneous abscess of groin: Secondary | ICD-10-CM | POA: Diagnosis not present

## 2021-06-27 DIAGNOSIS — L02214 Cutaneous abscess of groin: Secondary | ICD-10-CM | POA: Diagnosis not present

## 2021-07-29 DIAGNOSIS — H43813 Vitreous degeneration, bilateral: Secondary | ICD-10-CM | POA: Diagnosis not present

## 2021-07-29 DIAGNOSIS — H40013 Open angle with borderline findings, low risk, bilateral: Secondary | ICD-10-CM | POA: Diagnosis not present

## 2021-07-29 DIAGNOSIS — H04123 Dry eye syndrome of bilateral lacrimal glands: Secondary | ICD-10-CM | POA: Diagnosis not present

## 2021-10-21 ENCOUNTER — Other Ambulatory Visit: Payer: Self-pay | Admitting: Internal Medicine

## 2021-10-21 NOTE — Telephone Encounter (Signed)
Please refill as per office routine med refill policy (all routine meds to be refilled for 3 mo or monthly (per pt preference) up to one year from last visit, then month to month grace period for 3 mo, then further med refills will have to be denied) ? ?

## 2021-10-28 ENCOUNTER — Encounter: Payer: Self-pay | Admitting: Internal Medicine

## 2021-10-28 ENCOUNTER — Other Ambulatory Visit: Payer: Self-pay

## 2021-10-28 ENCOUNTER — Ambulatory Visit (INDEPENDENT_AMBULATORY_CARE_PROVIDER_SITE_OTHER): Payer: Medicare HMO | Admitting: Internal Medicine

## 2021-10-28 VITALS — BP 152/80 | HR 50 | Temp 97.6°F | Ht 71.0 in | Wt 168.0 lb

## 2021-10-28 DIAGNOSIS — E559 Vitamin D deficiency, unspecified: Secondary | ICD-10-CM

## 2021-10-28 DIAGNOSIS — Z0001 Encounter for general adult medical examination with abnormal findings: Secondary | ICD-10-CM | POA: Diagnosis not present

## 2021-10-28 DIAGNOSIS — J309 Allergic rhinitis, unspecified: Secondary | ICD-10-CM

## 2021-10-28 DIAGNOSIS — R7302 Impaired glucose tolerance (oral): Secondary | ICD-10-CM

## 2021-10-28 DIAGNOSIS — J449 Chronic obstructive pulmonary disease, unspecified: Secondary | ICD-10-CM | POA: Diagnosis not present

## 2021-10-28 DIAGNOSIS — E538 Deficiency of other specified B group vitamins: Secondary | ICD-10-CM

## 2021-10-28 DIAGNOSIS — I509 Heart failure, unspecified: Secondary | ICD-10-CM | POA: Diagnosis not present

## 2021-10-28 DIAGNOSIS — E7800 Pure hypercholesterolemia, unspecified: Secondary | ICD-10-CM | POA: Diagnosis not present

## 2021-10-28 DIAGNOSIS — K5090 Crohn's disease, unspecified, without complications: Secondary | ICD-10-CM | POA: Diagnosis not present

## 2021-10-28 LAB — CBC WITH DIFFERENTIAL/PLATELET
Basophils Absolute: 0.1 10*3/uL (ref 0.0–0.1)
Basophils Relative: 1.7 % (ref 0.0–3.0)
Eosinophils Absolute: 0.3 10*3/uL (ref 0.0–0.7)
Eosinophils Relative: 5 % (ref 0.0–5.0)
HCT: 35.4 % — ABNORMAL LOW (ref 39.0–52.0)
Hemoglobin: 11.9 g/dL — ABNORMAL LOW (ref 13.0–17.0)
Lymphocytes Relative: 23.9 % (ref 12.0–46.0)
Lymphs Abs: 1.3 10*3/uL (ref 0.7–4.0)
MCHC: 33.4 g/dL (ref 30.0–36.0)
MCV: 88.1 fl (ref 78.0–100.0)
Monocytes Absolute: 0.6 10*3/uL (ref 0.1–1.0)
Monocytes Relative: 11.7 % (ref 3.0–12.0)
Neutro Abs: 3.1 10*3/uL (ref 1.4–7.7)
Neutrophils Relative %: 57.7 % (ref 43.0–77.0)
Platelets: 166 10*3/uL (ref 150.0–400.0)
RBC: 4.02 Mil/uL — ABNORMAL LOW (ref 4.22–5.81)
RDW: 14.9 % (ref 11.5–15.5)
WBC: 5.3 10*3/uL (ref 4.0–10.5)

## 2021-10-28 LAB — HEPATIC FUNCTION PANEL
ALT: 19 U/L (ref 0–53)
AST: 24 U/L (ref 0–37)
Albumin: 4 g/dL (ref 3.5–5.2)
Alkaline Phosphatase: 69 U/L (ref 39–117)
Bilirubin, Direct: 0.2 mg/dL (ref 0.0–0.3)
Total Bilirubin: 1 mg/dL (ref 0.2–1.2)
Total Protein: 7.1 g/dL (ref 6.0–8.3)

## 2021-10-28 LAB — URINALYSIS, ROUTINE W REFLEX MICROSCOPIC
Bilirubin Urine: NEGATIVE
Hgb urine dipstick: NEGATIVE
Ketones, ur: NEGATIVE
Leukocytes,Ua: NEGATIVE
Nitrite: NEGATIVE
RBC / HPF: NONE SEEN (ref 0–?)
Specific Gravity, Urine: 1.015 (ref 1.000–1.030)
Total Protein, Urine: NEGATIVE
Urine Glucose: NEGATIVE
Urobilinogen, UA: 0.2 (ref 0.0–1.0)
pH: 6 (ref 5.0–8.0)

## 2021-10-28 LAB — BASIC METABOLIC PANEL
BUN: 17 mg/dL (ref 6–23)
CO2: 29 mEq/L (ref 19–32)
Calcium: 9.9 mg/dL (ref 8.4–10.5)
Chloride: 105 mEq/L (ref 96–112)
Creatinine, Ser: 1.46 mg/dL (ref 0.40–1.50)
GFR: 44.97 mL/min — ABNORMAL LOW (ref >60.00)
Glucose, Bld: 90 mg/dL (ref 70–99)
Potassium: 5.4 mEq/L — ABNORMAL HIGH (ref 3.5–5.1)
Sodium: 139 mEq/L (ref 135–145)

## 2021-10-28 LAB — LIPID PANEL
Cholesterol: 112 mg/dL (ref 0–200)
HDL: 42.3 mg/dL (ref >39.00)
LDL Cholesterol: 60 mg/dL (ref 0–99)
NonHDL: 69.88
Total CHOL/HDL Ratio: 3
Triglycerides: 49 mg/dL (ref 0.0–149.0)
VLDL: 9.8 mg/dL (ref 0.0–40.0)

## 2021-10-28 LAB — TSH: TSH: 3.07 u[IU]/mL (ref 0.35–5.50)

## 2021-10-28 LAB — HEMOGLOBIN A1C: Hgb A1c MFr Bld: 6.1 % (ref 4.6–6.5)

## 2021-10-28 LAB — VITAMIN B12: Vitamin B-12: 447 pg/mL (ref 211–911)

## 2021-10-28 LAB — VITAMIN D 25 HYDROXY (VIT D DEFICIENCY, FRACTURES): VITD: 15.17 ng/mL — ABNORMAL LOW (ref 30.00–100.00)

## 2021-10-28 MED ORDER — PREDNISONE 10 MG PO TABS: ORAL_TABLET | ORAL | 0 refills | Status: DC

## 2021-10-28 NOTE — Assessment & Plan Note (Signed)
Mild to mod seasonal flare, for prednisone course,  to f/u any worsening symptoms or concerns ?

## 2021-10-28 NOTE — Assessment & Plan Note (Signed)
Lab Results  ?Component Value Date  ? [place] 62 04/29/2021  ? ?Stable, pt to continue current statin lipitor 80 ? ?

## 2021-10-28 NOTE — Assessment & Plan Note (Signed)
Last vitamin D ?Lab Results  ?Component Value Date  ? VD25OH 29.98 (L) 04/29/2021  ? ?Low, to start oral replacement ? ?

## 2021-10-28 NOTE — Progress Notes (Signed)
Patient ID: Gary Levy, male   DOB: 07/22/1941, 81 y.o.   MRN: [id number]         Chief Complaint:: wellness exam and allergic rhinitis, low vit d, hyperglycemia, hld       HPI:  Gary Levy is a 81 y.o. male here for wellness exam; declines covid booster, shingrix, tdap, o/w up to date                        Also Does have several wks ongoing nasal allergy symptoms with clearish congestion, itch and sneezing, without fever, pain, ST, cough, swelling or wheezing.  Not taking Vit D.  Pt denies chest pain, increased sob or doe, wheezing, orthopnea, PND, increased LE swelling, palpitations, dizziness or syncope.   Pt denies polydipsia, polyuria, or new focal neuro s/s.    Pt denies fever, wt loss, night sweats, loss of appetite, or other constitutional symptoms  No other new complaints   Wt Readings from Last 3 Encounters:  10/28/21 168 lb (76.2 kg)  06/07/21 166 lb 6.4 oz (75.5 kg)  04/29/21 165 lb (74.8 kg)   BP Readings from Last 3 Encounters:  10/28/21 (!) 152/80  06/07/21 136/60  04/29/21 136/76   Immunization History  Administered Date(s) Administered   Fluad Quad(high Dose 65+) 04/15/2019, 06/02/2020, 04/29/2021   Influenza Split 05/05/2012   Influenza Whole 08/15/2009, 06/11/2010   Influenza, High Dose Seasonal PF 06/07/2013, 04/12/2015, 07/01/2016, 04/14/2017, 04/16/2018   Influenza,inj,Quad PF,6+ Mos 04/11/2014   PFIZER(Purple Top)SARS-COV-2 Vaccination 09/23/2019, 10/14/2019, 05/28/2020   Pneumococcal Conjugate-13 10/12/2013   Pneumococcal Polysaccharide-23 08/19/1995, 08/15/2009   Td 08/19/1995, 08/15/2009   There are no preventive care reminders to display for this patient.     Past Medical History:  Diagnosis Date   Alcohol abuse    hx   Anemia, iron deficiency    AR (allergic rhinitis)    BPH (benign prostatic hypertrophy)    CAD (coronary artery disease) of artery bypass graft    CHF (congestive heart failure) (HCC)    CKD (chronic kidney disease)  09/30/2011   COPD (chronic obstructive pulmonary disease) (HCC)    Crohn's disease ([place])    Dysuria    ED (erectile dysfunction) of organic origin    Fatigue    Glaucoma    Glucose intolerance (impaired glucose tolerance)    HLD (hyperlipidemia)    HTN (hypertension)    Impaired glucose tolerance 09/27/2011   MI (myocardial infarction) (HCC)    hx   Moderate or severe vision impairment, both eyes, impairment level not further specified    Postherpetic neuralgia    PSA elevation    Routine general medical examination at a health care facility    Shingles    Skin cancer    hx; non-melanoma   Special screening for malignant neoplasm of prostate    Urinary frequency    Past Surgical History:  Procedure Laterality Date   CORONARY ANGIOPLASTY WITH STENT PLACEMENT     CORONARY ARTERY BYPASS GRAFT     11+ years ago; has an EF of about 45%, last Myoview showed no ischemia 12/30/06   [facility]  2011   Dr. Rush Farmer    right cataract Bilateral 07/2010   with lens implant     reports that he quit smoking about 40 years ago. His smoking use included cigarettes. He has never used smokeless tobacco. He reports that he does not drink alcohol and does not use drugs. family history includes Heart disease  in his mother. No Known Allergies Current Outpatient Medications on File Prior to Visit  Medication Sig Dispense Refill   Alcohol Swabs (B-D SINGLE USE SWABS REGULAR) PADS Use as directed twice daily E11.9 200 each 3   allopurinol (ZYLOPRIM) 100 MG tablet TAKE 1 TABLET EVERY DAY 90 tablet 3   aspirin 81 MG EC tablet Take 81 mg by mouth daily.     atorvastatin (LIPITOR) 80 MG tablet TAKE 1 TABLET EVERY DAY AT 6:00 PM 90 tablet 3   bismuth-metronidazole-tetracycline (PYLERA) 140-125-125 MG per capsule Take 3 capsules by mouth 4 (four) times daily -  before meals and at bedtime. 120 capsule 0   Blood Glucose Calibration (TRUE METRIX LEVEL 1) Low SOLN Use as directed once daily E11.9 3 each 3   blood  glucose meter kit and supplies KIT Use daily to check blood sugar levels. 1 each 0   carvedilol (COREG) 12.5 MG tablet TAKE 1 TABLET TWICE DAILY WITH MEALS 180 tablet 3   colchicine 0.6 MG tablet TAKE 1 TABLET EVERY DAY 60 tablet 3   ezetimibe (ZETIA) 10 MG tablet Take 1 tablet (10 mg total) by mouth daily. 90 tablet 3   HYDROcodone-acetaminophen (NORCO/VICODIN) 5-325 MG tablet Take 1-2 tablets by mouth every 4 (four) hours as needed. 10 tablet 0   Multiple Vitamins-Minerals (CENTRUM PO) Take 1 tablet by mouth daily.     nitroGLYCERIN (NITROSTAT) 0.4 MG SL tablet Place 0.4 mg under the tongue every 5 (five) minutes as needed for chest pain (Call 911 at 3rd dose within 15 minutes).     spironolactone (ALDACTONE) 25 MG tablet TAKE 1 TABLET EVERY DAY 30 tablet 0   traMADol (ULTRAM) 50 MG tablet Take 1 tablet (50 mg total) by mouth every 6 (six) hours as needed. 30 tablet 0   triamcinolone (NASACORT) 55 MCG/ACT AERO nasal inhaler Place 2 sprays into the nose daily. 1 Inhaler 12   cetirizine (ZYRTEC) 10 MG tablet Take 1 tablet (10 mg total) by mouth daily. 30 tablet 11   No current facility-administered medications on file prior to visit.        ROS:  All others reviewed and negative.  Objective        PE:  BP (!) 152/80 (BP Location: Right Arm, Patient Position: Sitting, Cuff Size: Large)    Pulse (!) 50    Temp 97.6 F (36.4 C) (Oral)    Ht _0  (1.803 m)    Wt 168 lb (76.2 kg)    SpO2 99%    BMI 23.43 kg/m                 Constitutional: Pt appears in NAD               HENT: Head: NCAT.                Right Ear: External ear normal.                 Left Ear: External ear normal. Bilat tm's with mild erythema.  Max sinus areas none tender.  Pharynx with mild erythema, no exudate               Eyes: . Pupils are equal, round, and reactive to light. Conjunctivae and EOM are normal               Nose: without d/c or deformity               Neck: Neck  supple. Gross normal ROM                Cardiovascular: Normal rate and regular rhythm.                 Pulmonary/Chest: Effort normal and breath sounds without rales or wheezing.                Abd:  Soft, NT, ND, + BS, no organomegaly               Neurological: Pt is alert. At baseline orientation, motor grossly intact               Skin: Skin is warm. No rashes, no other new lesions, LE edema - none               Psychiatric: Pt behavior is normal without agitation   Micro: none  Cardiac tracings I have personally interpreted today:  none  Pertinent Radiological findings (summarize): none   Lab Results  Component Value Date   WBC 3.9 (L) 04/29/2021   HGB 12.1 (L) 04/29/2021   HCT 36.8 (L) 04/29/2021   PLT 155.0 04/29/2021   GLUCOSE 91 04/29/2021   CHOL 112 04/29/2021   TRIG 49.0 04/29/2021   HDL 40.80 04/29/2021   LDLCALC 62 04/29/2021   ALT 21 04/29/2021   AST 25 04/29/2021   NA 139 04/29/2021   K 4.1 04/29/2021   CL 106 04/29/2021   CREATININE 1.43 04/29/2021   BUN 22 04/29/2021   CO2 27 04/29/2021   TSH 4.05 04/29/2021   PSA 4.93 (H) 04/29/2021   HGBA1C 6.1 04/29/2021   MICROALBUR 0.4 03/24/2012   Assessment/Plan:  Gary Levy is a 81 y.o. Black or African American [2] male with  has a past medical history of Alcohol abuse, Anemia, iron deficiency, AR (allergic rhinitis), BPH (benign prostatic hypertrophy), CAD (coronary artery disease) of artery bypass graft, CHF (congestive heart failure) ([place]), CKD (chronic kidney disease) (09/30/2011), COPD (chronic obstructive pulmonary disease) ([place]), Crohn's disease ([place]), Dysuria, ED (erectile dysfunction) of organic origin, Fatigue, Glaucoma, Glucose intolerance (impaired glucose tolerance), HLD (hyperlipidemia), HTN (hypertension), Impaired glucose tolerance (09/27/2011), MI (myocardial infarction) ([place]), Moderate or severe vision impairment, both eyes, impairment level not further specified, Postherpetic neuralgia, PSA elevation, Routine general medical examination  at a health care facility, Shingles, Skin cancer, Special screening for malignant neoplasm of prostate, and Urinary frequency.  Vitamin D deficiency Last vitamin D Lab Results  Component Value Date   VD25OH 29.98 (L) 04/29/2021   Low, to start oral replacement   Encounter for well adult exam with abnormal findings Age and sex appropriate education and counseling updated with regular exercise and diet Referrals for preventative services - none needed Immunizations addressed - declines covid booster, shingrix Smoking counseling  - none needed Evidence for depression or other mood disorder - none significant Most recent labs reviewed. I have personally reviewed and have noted: 1) the patient's medical and social history 2) The patient's current medications and supplements 3) The patient's height, weight, and BMI have been recorded in the chart   Impaired glucose tolerance Lab Results  Component Value Date   HGBA1C 6.1 04/29/2021   Stable, pt to continue current medical treatment diet   Hyperlipidemia Lab Results  Component Value Date   LDLCALC 62 04/29/2021   Stable, pt to continue current statin lipitor 80   Allergic rhinitis Mild to mod seasonal flare, for prednisone course,  to f/u any worsening symptoms or concerns  Followup: Return in about 6 months (around 04/30/2022).  Cathlean Cower, MD 10/28/2021 5:26 PM [facility]

## 2021-10-28 NOTE — Assessment & Plan Note (Signed)
Lab Results  ?Component Value Date  ? HGBA1C 6.1 04/29/2021  ? ?Stable, pt to continue current medical treatment diet ? ?

## 2021-10-28 NOTE — Patient Instructions (Addendum)
Please take all new medication as prescribed  - the prednisone ? ?Please continue all other medications as before, and refills have been done if requested. ? ?Please have the pharmacy call with any other refills you may need. ? ?Please continue your efforts at being more active, low cholesterol diet, and weight control. ? ?You are otherwise up to date with prevention measures today. ? ?Please keep your appointments with your specialists as you may have planned ? ?Please go to the LAB at the blood drawing area for the tests to be done ? ?You will be contacted by phone if any changes need to be made immediately.  Otherwise, you will receive a letter about your results with an explanation, but please check with MyChart first. ? ?Please remember to sign up for MyChart if you have not done so, as this will be important to you in the future with finding out test results, communicating by private email, and scheduling acute appointments online when needed. ? ?Please make an Appointment to return in 6 months, or sooner if needed ?

## 2021-10-28 NOTE — Assessment & Plan Note (Signed)

## 2021-12-25 ENCOUNTER — Other Ambulatory Visit: Payer: Self-pay | Admitting: Internal Medicine

## 2021-12-25 NOTE — Telephone Encounter (Signed)
Please refill as per office routine med refill policy (all routine meds to be refilled for 3 mo or monthly (per pt preference) up to one year from last visit, then month to month grace period for 3 mo, then further med refills will have to be denied) ? ?

## 2022-01-30 ENCOUNTER — Other Ambulatory Visit: Payer: Self-pay | Admitting: Cardiovascular Disease

## 2022-02-20 ENCOUNTER — Ambulatory Visit (INDEPENDENT_AMBULATORY_CARE_PROVIDER_SITE_OTHER): Payer: Medicare HMO

## 2022-02-20 DIAGNOSIS — Z0000 Encounter for general adult medical examination without abnormal findings: Secondary | ICD-10-CM

## 2022-02-20 NOTE — Progress Notes (Signed)
I connected with Gary Levy today by telephone and verified that I am speaking with the correct person using two identifiers. Location patient: home Location provider: work Persons participating in the virtual visit: patient, provider.   I discussed the limitations, risks, security and privacy concerns of performing an evaluation and management service by telephone and the availability of in person appointments. I also discussed with the patient that there may be a patient responsible charge related to this service. The patient expressed understanding and verbally consented to this telephonic visit.    Interactive audio and video telecommunications were attempted between this provider and patient, however failed, due to patient having technical difficulties OR patient did not have access to video capability.  We continued and completed visit with audio only.  Some vital signs may be absent or patient reported.   Time Spent with patient on telephone encounter: 30 minutes  Subjective:   Gary Levy is a 81 y.o. male who presents for Medicare Annual/Subsequent preventive examination.  Review of Systems     Cardiac Risk Factors include: advanced age (>67mn, >>38women);dyslipidemia;hypertension;male gender;family history of premature cardiovascular disease     Objective:    There were no vitals filed for this visit. There is no height or weight on file to calculate BMI.     02/20/2022    1:34 PM 06/27/2019    2:43 PM 10/01/2018    9:56 AM 04/22/2018    8:28 AM 12/31/2016    9:17 AM 10/14/2016   10:13 AM  Advanced Directives  Does Patient Have a Medical Advance Directive? No Yes No No No Yes  Type of ASocial research officer, governmentLiving will    Living will  [place] of [place]?  No - copy requested      Would patient like information on creating a medical advance directive? No - Patient declined  No - Patient declined No - Patient declined  No - Patient declined     Current Medications (verified) Outpatient Encounter Medications as of 02/20/2022  Medication Sig   Alcohol Swabs (B-D SINGLE USE SWABS REGULAR) PADS Use as directed twice daily E11.9   allopurinol (ZYLOPRIM) 100 MG tablet TAKE 1 TABLET EVERY DAY   aspirin 81 MG EC tablet Take 81 mg by mouth daily.   atorvastatin (LIPITOR) 80 MG tablet TAKE 1 TABLET EVERY DAY AT 6:00 PM   bismuth-metronidazole-tetracycline (PYLERA) 140-125-125 MG per capsule Take 3 capsules by mouth 4 (four) times daily -  before meals and at bedtime.   Blood Glucose Calibration (TRUE METRIX LEVEL 1) Low SOLN Use as directed once daily E11.9   blood glucose meter kit and supplies KIT Use daily to check blood sugar levels.   carvedilol (COREG) 12.5 MG tablet TAKE 1 TABLET TWICE DAILY WITH MEALS   cetirizine (ZYRTEC) 10 MG tablet Take 1 tablet (10 mg total) by mouth daily.   colchicine 0.6 MG tablet TAKE 1 TABLET EVERY DAY   ezetimibe (ZETIA) 10 MG tablet TAKE 1 TABLET EVERY DAY   HYDROcodone-acetaminophen (NORCO/VICODIN) 5-325 MG tablet Take 1-2 tablets by mouth every 4 (four) hours as needed.   Multiple Vitamins-Minerals (CENTRUM PO) Take 1 tablet by mouth daily.   nitroGLYCERIN (NITROSTAT) 0.4 MG SL tablet Place 0.4 mg under the tongue every 5 (five) minutes as needed for chest pain (Call 911 at 3rd dose within 15 minutes).   predniSONE (DELTASONE) 10 MG tablet 3 tabs by mouth per day for 3 days,2tabs per  day for 3 days,1tab per day for 3 days   spironolactone (ALDACTONE) 25 MG tablet TAKE 1 TABLET EVERY DAY   traMADol (ULTRAM) 50 MG tablet Take 1 tablet (50 mg total) by mouth every 6 (six) hours as needed.   triamcinolone (NASACORT) 55 MCG/ACT AERO nasal inhaler Place 2 sprays into the nose daily.   No facility-administered encounter medications on file as of 02/20/2022.    Allergies (verified) Patient has no known allergies.   History: Past Medical History:  Diagnosis Date   Alcohol abuse     hx   Anemia, iron deficiency    AR (allergic rhinitis)    BPH (benign prostatic hypertrophy)    CAD (coronary artery disease) of artery bypass graft    CHF (congestive heart failure) (HCC)    CKD (chronic kidney disease) 09/30/2011   COPD (chronic obstructive pulmonary disease) (HCC)    Crohn's disease (HCC)    Dysuria    ED (erectile dysfunction) of organic origin    Fatigue    Glaucoma    Glucose intolerance (impaired glucose tolerance)    HLD (hyperlipidemia)    HTN (hypertension)    Impaired glucose tolerance 09/27/2011   MI (myocardial infarction) (HCC)    hx   Moderate or severe vision impairment, both eyes, impairment level not further specified    Postherpetic neuralgia    PSA elevation    Routine general medical examination at a health care facility    Shingles    Skin cancer    hx; non-melanoma   Special screening for malignant neoplasm of prostate    Urinary frequency    Past Surgical History:  Procedure Laterality Date   CORONARY ANGIOPLASTY WITH STENT PLACEMENT     CORONARY ARTERY BYPASS GRAFT     11+ years ago; has an EF of about 45%, last Myoview showed no ischemia 12/30/06   [facility]  2011   Dr. Rush Farmer    right cataract Bilateral 07/2010   with lens implant    Family History  Problem Relation Age of Onset   Heart disease Mother    Social History   Socioeconomic History   Marital status: Married    Spouse name: Not on file   Number of children: 6   Years of education: Not on file   Highest education level: Not on file  Occupational History   Occupation: Retired  Tobacco Use   Smoking status: Former    Types: Cigarettes    Quit date: 03/20/1981    Years since quitting: 40.9   Smokeless tobacco: Never  Vaping Use   Vaping Use: Never used  Substance and Sexual Activity   Alcohol use: No   Drug use: No   Sexual activity: Yes  Other Topics Concern   Not on file  Social History Narrative   Married, 4 children; retired - Museum/gallery curator.       Designated party release on file. [employer]. 01/15/10.    Social Determinants of Health   Financial Resource Strain: Low Risk  (02/20/2022)   Overall Financial Resource Strain (CARDIA)    Difficulty of Paying Living Expenses: Not hard at all  Food Insecurity: No Food Insecurity (02/20/2022)   Hunger Vital Sign    Worried About Running Out of Food in the Last Year: Never true    Ran Out of Food in the Last Year: Never true  Transportation Needs: No Transportation Needs (02/20/2022)   PRAPARE - Hydrologist (Medical): No  Lack of Transportation (Non-Medical): No  Physical Activity: Sufficiently Active (02/20/2022)   Exercise Vital Sign    Days of Exercise per Week: 5 days    Minutes of Exercise per Session: 30 min  Stress: No Stress Concern Present (02/20/2022)   [place]    Feeling of Stress : Not at all  Social Connections: [place] (02/20/2022)   Social Connection and Isolation Panel [NHANES]    Frequency of Communication with Friends and Family: More than three times a week    Frequency of Social Gatherings with Friends and Family: More than three times a week    Attends Religious Services: 1 to 4 times per year    Active Member of [employer] or Organizations: Yes    Attends Archivist Meetings: 1 to 4 times per year    Marital Status: Married    Tobacco Counseling Counseling given: Not Answered   Clinical Intake:  Pre-visit preparation completed: Yes  Pain : No/denies pain     BMI - recorded: 23.44 Nutritional Status: BMI of 19-24  Normal Nutritional Risks: None Diabetes: No  How often do you need to have someone help you when you read instructions, pamphlets, or other written materials from your doctor or pharmacy?: 1 - Never What is the last grade level you completed in school?: 10th grade  Diabetic? no  Interpreter Needed?: No  Information entered by  :: Lisette Abu, LPN.   Activities of Daily Living    02/20/2022    1:37 PM 04/29/2021    9:25 AM  In your present state of health, do you have any difficulty performing the following activities:  Hearing? 0 0  Vision? 0 0  Difficulty concentrating or making decisions? 0 0  Walking or climbing stairs? 0 0  Dressing or bathing? 0 0  Doing errands, shopping? 0 0  Preparing Food and eating ? N   Using the Toilet? N   In the past six months, have you accidently leaked urine? N   Do you have problems with loss of bowel control? N   Managing your Medications? N   Managing your Finances? N   Housekeeping or managing your Housekeeping? N     Patient Care Team: Biagio Borg, MD as PCP - General Angelena Form Annita Brod, MD as PCP - Cardiology (Cardiology) Calvert Cantor, MD as Consulting Physician (Ophthalmology)  Indicate any recent Medical Services you may have received from other than [facility] providers in the past year (date may be approximate).     Assessment:   This is a routine wellness examination for [place].  Hearing/Vision screen Hearing Screening - Comments:: Patient denied any hearing difficulty.   No hearing aids.  Vision Screening - Comments:: Patient does wear readers.  [facility] exam done by: [facility]   Dietary issues and exercise activities discussed: Current Exercise Habits: Home exercise routine, Type of exercise: walking, Time (Minutes): 30, Frequency (Times/Week): 5, Weekly Exercise (Minutes/Week): 150, Intensity: Moderate, Exercise limited by: cardiac condition(s)   Goals Addressed   None   Depression Screen    02/20/2022    1:36 PM 10/28/2021    1:03 PM 04/29/2021    9:41 AM 04/29/2021    9:25 AM 04/26/2020    9:24 AM 10/20/2019    9:05 AM 06/27/2019    2:44 PM  PHQ 2/9 Scores  PHQ - 2 Score 0 0 0 0 0 0 0    Fall Risk  02/20/2022    1:35 PM 10/28/2021    1:03 PM 04/29/2021    9:41 AM 04/29/2021    9:25 AM 04/26/2020    9:24 AM  Fall Risk    Falls in the past year? 0 0 0 0 0  Number falls in past yr: 0 0 0 0   Injury with Fall? 0 0 0 0   Risk for fall due to : No Fall Risks      Follow up Falls evaluation completed        FALL RISK PREVENTION PERTAINING TO THE HOME:  Any stairs in or around the home? No  If so, are there any without handrails? No  Home free of loose throw rugs in walkways, pet beds, electrical cords, etc? Yes  Adequate lighting in your home to reduce risk of falls? Yes   ASSISTIVE DEVICES UTILIZED TO PREVENT FALLS:  Life alert? No  Use of a cane, walker or w/c? No  Grab bars in the bathroom? Yes  Shower chair or bench in shower? No  Elevated toilet seat or a handicapped toilet? Yes   TIMED UP AND GO:  Was the test performed? No .  Length of time to ambulate 10 feet: n/a sec.   Appearance of gait: Gait not evaluated during this visit.  Cognitive Function:    04/22/2018    8:31 AM  MMSE - Mini Mental State Exam  Orientation to time 5  Orientation to Place 5  Registration 3  Attention/ Calculation 0  Attention/Calculation-comments states he cannot do this  Recall 2  Language- name 2 objects 2  Language- repeat 1  Language- follow 3 step command 3  Language- read & follow direction 1  Write a sentence 1  Copy design 1  Total score 24        02/20/2022    1:41 PM  6CIT Screen  What Year? 0 points  What month? 0 points  What time? 0 points  Count back from 20 0 points  Months in reverse 0 points  Repeat phrase 0 points  Total Score 0 points    Immunizations Immunization History  Administered Date(s) Administered   Fluad Quad(high Dose 65+) 04/15/2019, 06/02/2020, 04/29/2021   Influenza Split 05/05/2012   Influenza Whole 08/15/2009, 06/11/2010   Influenza, High Dose Seasonal PF 06/07/2013, 04/12/2015, 07/01/2016, 04/14/2017, 04/16/2018   Influenza,inj,Quad PF,6+ Mos 04/11/2014   PFIZER(Purple Top)SARS-COV-2 Vaccination 09/23/2019, 10/14/2019, 05/28/2020   Pneumococcal  Conjugate-13 10/12/2013   Pneumococcal Polysaccharide-23 08/19/1995, 08/15/2009   Td 08/19/1995, 08/15/2009    TDAP status: Due, Education has been provided regarding the importance of this vaccine. Advised may receive this vaccine at local pharmacy or Health Dept. Aware to provide a copy of the vaccination record if obtained from local pharmacy or Health Dept. Verbalized acceptance and understanding.  Flu Vaccine status: Up to date  Pneumococcal vaccine status: Up to date  Covid-19 vaccine status: Completed vaccines  Qualifies for Shingles Vaccine? Yes   Zostavax completed No   Shingrix Completed?: No.    Education has been provided regarding the importance of this vaccine. Patient has been advised to call insurance company to determine out of pocket expense if they have not yet received this vaccine. Advised may also receive vaccine at local pharmacy or Health Dept. Verbalized acceptance and understanding.  Screening Tests Health Maintenance  Topic Date Due   Zoster Vaccines- Shingrix (1 of 2) Never done   COVID-19 Vaccine (4 - Pfizer series) 07/23/2020   TETANUS/TDAP  04/29/2022 (Originally 08/16/2019)   INFLUENZA VACCINE  03/18/2022   Pneumonia Vaccine 29+ Years old  Completed   HPV VACCINES  Aged Out    Health Maintenance  Health Maintenance Due  Topic Date Due   Zoster Vaccines- Shingrix (1 of 2) Never done   COVID-19 Vaccine (4 - Pfizer series) 07/23/2020    Colorectal cancer screening: No longer required.   Lung Cancer Screening: (Low Dose CT Chest recommended if Age 9-80 years, 30 pack-year currently smoking OR have quit w/in 15years.) does not qualify.   Lung Cancer Screening Referral: no  Additional Screening:  Hepatitis C Screening: does not qualify; Completed no  Vision Screening: Recommended annual ophthalmology exams for early detection of glaucoma and other disorders of the eye. Is the patient up to date with their annual eye exam?  Yes  Who is the  provider or what is the name of the office in which the patient attends annual eye exams? Gala Romney, MD. If pt is not established with a provider, would they like to be referred to a provider to establish care? No .   Dental Screening: Recommended annual dental exams for proper oral hygiene  Community Resource Referral / Chronic Care Management: CRR required this visit?  No   CCM required this visit?  No      Plan:     I have personally reviewed and noted the following in the patient's chart:   Medical and social history Use of alcohol, tobacco or illicit drugs  Current medications and supplements including opioid prescriptions. Patient is currently taking opioid prescriptions. Information provided to patient regarding non-opioid alternatives. Patient advised to discuss non-opioid treatment plan with their provider. Functional ability and status Nutritional status Physical activity Advanced directives List of other physicians Hospitalizations, surgeries, and ER visits in previous 12 months Vitals Screenings to include cognitive, depression, and falls Referrals and appointments  In addition, I have reviewed and discussed with patient certain preventive protocols, quality metrics, and best practice recommendations. A written personalized care plan for preventive services as well as general preventive health recommendations were provided to patient.     Sheral Flow, LPN   02/19/7184   Nurse Notes:  Patient is cogitatively intact. There were no vitals filed for this visit. There is no height or weight on file to calculate BMI.

## 2022-02-20 NOTE — Patient Instructions (Signed)
[name] , Thank you for taking time to come for your Medicare Wellness Visit. I appreciate your ongoing commitment to your health goals. Please review the following plan we discussed and let me know if I can assist you in the future.   Screening recommendations/referrals: Colonoscopy: Discontinued Recommended yearly ophthalmology/optometry visit for glaucoma screening and checkup Recommended yearly dental visit for hygiene and checkup  Vaccinations: Influenza vaccine: 04/29/2021 Pneumococcal vaccine: 08/15/2009, 10/12/2013 Tdap vaccine: 08/15/2009; due every 10 years (overdue) Shingles vaccine: never done   Covid-19: 09/23/2019, 10/14/2019, 05/28/2020  Advanced directives: No  Conditions/risks identified: Yes  Next appointment: Please schedule your next Medicare Wellness Visit with your Nurse Health Advisor in 1 year by calling [phone].  Preventive Care 48 Years and Older, Male Preventive care refers to lifestyle choices and visits with your health care provider that can promote health and wellness. What does preventive care include? A yearly physical exam. This is also called an annual well check. Dental exams once or twice a year. Routine eye exams. Ask your health care provider how often you should have your eyes checked. Personal lifestyle choices, including: Daily care of your teeth and gums. Regular physical activity. Eating a healthy diet. Avoiding tobacco and drug use. Limiting alcohol use. Practicing safe sex. Taking low doses of aspirin every day. Taking vitamin and mineral supplements as recommended by your health care provider. What happens during an annual well check? The services and screenings done by your health care provider during your annual well check will depend on your age, overall health, lifestyle risk factors, and family history of disease. Counseling  Your health care provider may ask you questions about your: Alcohol use. Tobacco use. Drug  use. Emotional well-being. Home and relationship well-being. Sexual activity. Eating habits. History of falls. Memory and ability to understand (cognition). Work and work Statistician. Screening  You may have the following tests or measurements: Height, weight, and BMI. Blood pressure. Lipid and cholesterol levels. These may be checked every 5 years, or more frequently if you are over 2 years old. Skin check. Lung cancer screening. You may have this screening every year starting at age 17 if you have a 30-pack-year history of smoking and currently smoke or have quit within the past 15 years. Fecal occult blood test (FOBT) of the stool. You may have this test every year starting at age 78. Flexible sigmoidoscopy or colonoscopy. You may have a sigmoidoscopy every 5 years or a colonoscopy every 10 years starting at age 55. Prostate cancer screening. Recommendations will vary depending on your family history and other risks. Hepatitis C blood test. Hepatitis B blood test. Sexually transmitted disease (STD) testing. Diabetes screening. This is done by checking your blood sugar (glucose) after you have not eaten for a while (fasting). You may have this done every 1-3 years. Abdominal aortic aneurysm (AAA) screening. You may need this if you are a current or former smoker. Osteoporosis. You may be screened starting at age 42 if you are at high risk. Talk with your health care provider about your test results, treatment options, and if necessary, the need for more tests. Vaccines  Your health care provider may recommend certain vaccines, such as: Influenza vaccine. This is recommended every year. Tetanus, diphtheria, and acellular pertussis (Tdap, Td) vaccine. You may need a Td booster every 10 years. Zoster vaccine. You may need this after age 92. Pneumococcal 13-valent conjugate (PCV13) vaccine. One dose is recommended after age 44. Pneumococcal polysaccharide (PPSV23) vaccine. One dose is  recommended after age 63. Talk to your health care provider about which screenings and vaccines you need and how often you need them. This information is not intended to replace advice given to you by your health care provider. Make sure you discuss any questions you have with your health care provider. Document Released: 08/31/2015 Document Revised: 04/23/2016 Document Reviewed: 06/05/2015 Elsevier Interactive Patient Education  2017 [place] Prevention in the Home Falls can cause injuries. They can happen to people of all ages. There are many things you can do to make your home safe and to help prevent falls. What can I do on the outside of my home? Regularly fix the edges of walkways and driveways and fix any cracks. Remove anything that might make you trip as you walk through a door, such as a raised step or threshold. Trim any bushes or trees on the path to your home. Use bright outdoor lighting. Clear any walking paths of anything that might make someone trip, such as rocks or tools. Regularly check to see if handrails are loose or broken. Make sure that both sides of any steps have handrails. Any raised decks and porches should have guardrails on the edges. Have any leaves, snow, or ice cleared regularly. Use sand or salt on walking paths during winter. Clean up any spills in your garage right away. This includes oil or grease spills. What can I do in the bathroom? Use night lights. Install grab bars by the toilet and in the tub and shower. Do not use towel bars as grab bars. Use non-skid mats or decals in the tub or shower. If you need to sit down in the shower, use a plastic, non-slip stool. Keep the floor dry. Clean up any water that spills on the floor as soon as it happens. Remove soap buildup in the tub or shower regularly. Attach bath mats securely with double-sided non-slip rug tape. Do not have throw rugs and other things on the floor that can make you  trip. What can I do in the bedroom? Use night lights. Make sure that you have a light by your bed that is easy to reach. Do not use any sheets or blankets that are too big for your bed. They should not hang down onto the floor. Have a firm chair that has side arms. You can use this for support while you get dressed. Do not have throw rugs and other things on the floor that can make you trip. What can I do in the kitchen? Clean up any spills right away. Avoid walking on wet floors. Keep items that you use a lot in easy-to-reach places. If you need to reach something above you, use a strong step stool that has a grab bar. Keep electrical cords out of the way. Do not use floor polish or wax that makes floors slippery. If you must use wax, use non-skid floor wax. Do not have throw rugs and other things on the floor that can make you trip. What can I do with my stairs? Do not leave any items on the stairs. Make sure that there are handrails on both sides of the stairs and use them. Fix handrails that are broken or loose. Make sure that handrails are as long as the stairways. Check any carpeting to make sure that it is firmly attached to the stairs. Fix any carpet that is loose or worn. Avoid having throw rugs at the top or bottom of the stairs. If you  do have throw rugs, attach them to the floor with carpet tape. Make sure that you have a light switch at the top of the stairs and the bottom of the stairs. If you do not have them, ask someone to add them for you. What else can I do to help prevent falls? Wear shoes that: Do not have high heels. Have rubber bottoms. Are comfortable and fit you well. Are closed at the toe. Do not wear sandals. If you use a stepladder: Make sure that it is fully opened. Do not climb a closed stepladder. Make sure that both sides of the stepladder are locked into place. Ask someone to hold it for you, if possible. Clearly mark and make sure that you can  see: Any grab bars or handrails. First and last steps. Where the edge of each step is. Use tools that help you move around (mobility aids) if they are needed. These include: Canes. Walkers. Scooters. Crutches. Turn on the lights when you go into a dark area. Replace any light bulbs as soon as they burn out. Set up your furniture so you have a clear path. Avoid moving your furniture around. If any of your floors are uneven, fix them. If there are any pets around you, be aware of where they are. Review your medicines with your doctor. Some medicines can make you feel dizzy. This can increase your chance of falling. Ask your doctor what other things that you can do to help prevent falls. This information is not intended to replace advice given to you by your health care provider. Make sure you discuss any questions you have with your health care provider. Document Released: 05/31/2009 Document Revised: 01/10/2016 Document Reviewed: 09/08/2014 Elsevier Interactive Patient Education  2017 [employer].

## 2022-02-21 ENCOUNTER — Encounter: Payer: Self-pay | Admitting: Cardiovascular Disease

## 2022-02-21 ENCOUNTER — Ambulatory Visit: Payer: Medicare HMO | Admitting: Cardiovascular Disease

## 2022-02-21 VITALS — BP 148/76 | HR 52 | Ht 71.0 in | Wt 161.4 lb

## 2022-02-21 DIAGNOSIS — I25810 Atherosclerosis of coronary artery bypass graft(s) without angina pectoris: Secondary | ICD-10-CM

## 2022-02-21 DIAGNOSIS — I10 Essential (primary) hypertension: Secondary | ICD-10-CM | POA: Diagnosis not present

## 2022-02-21 DIAGNOSIS — I255 Ischemic cardiomyopathy: Secondary | ICD-10-CM | POA: Diagnosis not present

## 2022-02-21 DIAGNOSIS — E785 Hyperlipidemia, unspecified: Secondary | ICD-10-CM | POA: Diagnosis not present

## 2022-02-21 NOTE — Patient Instructions (Signed)
Medication Instructions:  Your physician recommends that you continue on your current medications as directed. Please refer to the Current Medication list given to you today.  *If you need a refill on your cardiac medications before your next appointment, please call your pharmacy*   Lab Work: NONE If you have labs (blood work) drawn today and your tests are completely normal, you will receive your results only by: [place] (if you have MyChart) OR A paper copy in the mail If you have any lab test that is abnormal or we need to change your treatment, we will call you to review the results.   Testing/Procedures: NONE   Follow-Up: At [facility], you and your health needs are our priority.  As part of our continuing mission to provide you with exceptional heart care, we have created designated Provider Care Teams.  These Care Teams include your primary Cardiologist (physician) and Advanced Practice Providers (APPs -  Physician Assistants and Nurse Practitioners) who all work together to provide you with the care you need, when you need it.  We recommend signing up for the patient portal called "MyChart".  Sign up information is provided on this After Visit Summary.  MyChart is used to connect with patients for Virtual Visits (Telemedicine).  Patients are able to view lab/test results, encounter notes, upcoming appointments, etc.  Non-urgent messages can be sent to your provider as well.   To learn more about what you can do with MyChart, go to [email or web address].    Your next appointment:   1 year(s)  The format for your next appointment:   In Person  Provider:   Lauree Chandler, MD    Important Information About Sugar

## 2022-02-21 NOTE — Progress Notes (Signed)
Chief Complaint  Patient presents with   Follow-up    CAD    History of Present Illness: 81 yo male with history of CAD s/p 4V CABG in 1995, ischemic cardiomyopathy, HTN, HLD, COPD, CKD here today for cardiac follow up. He has CAD dating back to the 1990s with prior PCI and CABG. The last cath report I can find is scanned into EPIC from 1999 and shows failure of the LIMA graft to the LAD and the SVG to the RCA. There was high grade disease in the SVG to the OM and the SVG to the Diagonal. It appears that the LAD was treated with rotablator atherectomy and stenting in 1999. The SVG to the OM and the SVG to the Diagonal were treated with angioplasty. I saw him 07/28/13 and he had c/o exertional chest pains, resolved with rest. Stress myoview 08/17/13 Intermediate risk study with a fixed large, severe inferolateral, basal to mid inferior, and mid to apical anterior perfusion defect. This suggested prior infarction with no significant ischemia. Echo April 2019 with LVEF=40-45%. Mild MR. Echo June 2022 with LVEF=45%. Trivial MR.   He is here today for follow up. The patient denies any chest pain, palpitations, lower extremity edema, orthopnea, PND, dizziness, near syncope or syncope. He has chronic dyspnea with moderate exertion. No change over past few years.    Primary Care Physician: Biagio Borg, MD  Past Medical History:  Diagnosis Date   Alcohol abuse    hx   Anemia, iron deficiency    AR (allergic rhinitis)    BPH (benign prostatic hypertrophy)    CAD (coronary artery disease) of artery bypass graft    CHF (congestive heart failure) (HCC)    CKD (chronic kidney disease) 09/30/2011   COPD (chronic obstructive pulmonary disease) (HCC)    Crohn's disease ([place])    Dysuria    ED (erectile dysfunction) of organic origin    Fatigue    Glaucoma    Glucose intolerance (impaired glucose tolerance)    HLD (hyperlipidemia)    HTN (hypertension)    Impaired glucose tolerance 09/27/2011   MI  (myocardial infarction) (HCC)    hx   Moderate or severe vision impairment, both eyes, impairment level not further specified    Postherpetic neuralgia    PSA elevation    Routine general medical examination at a health care facility    Shingles    Skin cancer    hx; non-melanoma   Special screening for malignant neoplasm of prostate    Urinary frequency     Past Surgical History:  Procedure Laterality Date   CORONARY ANGIOPLASTY WITH STENT PLACEMENT     CORONARY ARTERY BYPASS GRAFT     11+ years ago; has an EF of about 45%, last Myoview showed no ischemia 12/30/06   [facility]  2011   Dr. Rush Farmer    right cataract Bilateral 07/2010   with lens implant     Current Outpatient Medications  Medication Sig Dispense Refill   Alcohol Swabs (B-D SINGLE USE SWABS REGULAR) PADS Use as directed twice daily E11.9 200 each 3   allopurinol (ZYLOPRIM) 100 MG tablet TAKE 1 TABLET EVERY DAY 90 tablet 3   aspirin 81 MG EC tablet Take 81 mg by mouth daily.     atorvastatin (LIPITOR) 80 MG tablet TAKE 1 TABLET EVERY DAY AT 6:00 PM 90 tablet 3   bismuth-metronidazole-tetracycline (PYLERA) 140-125-125 MG per capsule Take 3 capsules by mouth 4 (four) times daily -  before meals and at bedtime. 120 capsule 0   Blood Glucose Calibration (TRUE METRIX LEVEL 1) Low SOLN Use as directed once daily E11.9 3 each 3   blood glucose meter kit and supplies KIT Use daily to check blood sugar levels. 1 each 0   carvedilol (COREG) 12.5 MG tablet TAKE 1 TABLET TWICE DAILY WITH MEALS 180 tablet 3   colchicine 0.6 MG tablet TAKE 1 TABLET EVERY DAY 60 tablet 3   ezetimibe (ZETIA) 10 MG tablet TAKE 1 TABLET EVERY DAY 90 tablet 1   HYDROcodone-acetaminophen (NORCO/VICODIN) 5-325 MG tablet Take 1-2 tablets by mouth every 4 (four) hours as needed. 10 tablet 0   Multiple Vitamins-Minerals (CENTRUM PO) Take 1 tablet by mouth daily.     nitroGLYCERIN (NITROSTAT) 0.4 MG SL tablet Place 0.4 mg under the tongue every 5 (five) minutes  as needed for chest pain (Call 911 at 3rd dose within 15 minutes).     predniSONE (DELTASONE) 10 MG tablet 3 tabs by mouth per day for 3 days,2tabs per day for 3 days,1tab per day for 3 days 18 tablet 0   spironolactone (ALDACTONE) 25 MG tablet TAKE 1 TABLET EVERY DAY 60 tablet 0   traMADol (ULTRAM) 50 MG tablet Take 1 tablet (50 mg total) by mouth every 6 (six) hours as needed. 30 tablet 0   triamcinolone (NASACORT) 55 MCG/ACT AERO nasal inhaler Place 2 sprays into the nose daily. 1 Inhaler 12   cetirizine (ZYRTEC) 10 MG tablet Take 1 tablet (10 mg total) by mouth daily. 30 tablet 11   No current facility-administered medications for this visit.    No Known Allergies  Social History   Socioeconomic History   Marital status: Married    Spouse name: Not on file   Number of children: 6   Years of education: Not on file   Highest education level: Not on file  Occupational History   Occupation: Retired  Tobacco Use   Smoking status: Former    Types: Cigarettes    Quit date: 03/20/1981    Years since quitting: 40.9   Smokeless tobacco: Never  Vaping Use   Vaping Use: Never used  Substance and Sexual Activity   Alcohol use: No   Drug use: No   Sexual activity: Yes  Other Topics Concern   Not on file  Social History Narrative   Married, 4 children; retired - Museum/gallery curator.      Designated party release on file. [employer]. 01/15/10.    Social Determinants of Health   Financial Resource Strain: Low Risk  (02/20/2022)   Overall Financial Resource Strain (CARDIA)    Difficulty of Paying Living Expenses: Not hard at all  Food Insecurity: No Food Insecurity (02/20/2022)   Hunger Vital Sign    Worried About Running Out of Food in the Last Year: Never true    Ran Out of Food in the Last Year: Never true  Transportation Needs: No Transportation Needs (02/20/2022)   PRAPARE - Hydrologist (Medical): No    Lack of Transportation (Non-Medical): No  Physical  Activity: Sufficiently Active (02/20/2022)   Exercise Vital Sign    Days of Exercise per Week: 5 days    Minutes of Exercise per Session: 30 min  Stress: No Stress Concern Present (02/20/2022)   [place]    Feeling of Stress : Not at all  Social Connections: [place] (02/20/2022)   Social Connection  and Isolation Panel [NHANES]    Frequency of Communication with Friends and Family: More than three times a week    Frequency of Social Gatherings with Friends and Family: More than three times a week    Attends Religious Services: 1 to 4 times per year    Active Member of [employer] or Organizations: Yes    Attends Archivist Meetings: 1 to 4 times per year    Marital Status: Married  Human resources officer Violence: Not At Risk (02/20/2022)   Humiliation, Afraid, Rape, and Kick questionnaire    Fear of Current or Ex-Partner: No    Emotionally Abused: No    Physically Abused: No    Sexually Abused: No    Family History  Problem Relation Age of Onset   Heart disease Mother     Review of Systems:  As stated in the HPI and otherwise negative.   BP (!) 148/76   Pulse (!) 52   Ht 5' 11"  (1.803 m)   Wt 161 lb 6.4 oz (73.2 kg)   BMI 22.51 kg/m   Physical Examination:  General: Well developed, well nourished, NAD  HEENT: OP clear, mucus membranes moist  SKIN: warm, dry. No rashes. Neuro: No focal deficits  Musculoskeletal: Muscle strength 5/5 all ext  Psychiatric: Mood and affect normal  Neck: No JVD, no carotid bruits, no thyromegaly, no lymphadenopathy.  Lungs:Clear bilaterally, no wheezes, rhonci, crackles Cardiovascular: Regular rate and rhythm. No murmurs, gallops or rubs. Abdomen:Soft. Bowel sounds present. Non-tender.  Extremities: No lower extremity edema. Pulses are 2 + in the bilateral DP/PT.  Echo June 2022:  1. Left ventricular ejection fraction, by estimation, is 45%. The left  ventricle has  mildly decreased function. The left ventricle demonstrates  regional wall motion abnormalities (distal LAD territory). Left  ventricular diastolic parameters are  consistent with Grade II diastolic dysfunction (pseudonormalization).  Elevated left atrial pressure.   2. Right ventricular systolic function is normal. The right ventricular  size is normal. Mildly increased right ventricular wall thickness. There  is mildly elevated pulmonary artery systolic pressure. The estimated right  ventricular systolic pressure is  16.1 mmHg.   3. The mitral valve is grossly normal. Trivial mitral valve  regurgitation.   4. The aortic valve is tricuspid. There is moderate calcification of the  aortic valve (predominantely non-cusp). Aortic valve regurgitation is not  visualized. No aortic stenosis is present.   5. The inferior vena cava is normal in size with greater than 50%  respiratory variability, suggesting right atrial pressure of 3 mmHg.   EKG:  EKG is ordered today. The ekg ordered today demonstrates Sinus bradycardia, rate 52 bpm. Lateral T wave inversions  Recent Labs: 10/28/2021: ALT 19; BUN 17; Creatinine, Ser 1.46; Hemoglobin 11.9; Platelets 166.0; Potassium 5.4 No hemolysis seen; Sodium 139; TSH 3.07   Lipid Panel    Component Value Date/Time   CHOL 112 10/28/2021 1348   CHOL 139 01/16/2021 0758   TRIG 49.0 10/28/2021 1348   HDL 42.30 10/28/2021 1348   HDL 42 01/16/2021 0758   CHOLHDL 3 10/28/2021 1348   VLDL 9.8 10/28/2021 1348   LDLCALC 60 10/28/2021 1348   LDLCALC 85 01/16/2021 0758     Wt Readings from Last 3 Encounters:  02/21/22 161 lb 6.4 oz (73.2 kg)  10/28/21 168 lb (76.2 kg)  06/07/21 166 lb 6.4 oz (75.5 kg)     Other studies Reviewed: Additional studies/ records that were reviewed today include: . Review of the  above records demonstrates:    Assessment and Plan:   1. CAD s/p CABG without angina: He has no chest pain suggestive of angina. Continue ASA,  statin and beta blocker.       2. HTN: BP is well controlled at home. No changes  3. Hyperlipidemia: LDL at goal in March 2023. Continue statin.    4. Ischemic cardiomyopathy: Last LVEF 45% by echo in 2022. He is not on an Ace -inh or ARB due to renal insufficiency. Will continue Coreg and aldactone.  5. Chronic systolic CHF: His weight is stable. No volume overload.   Current medicines are reviewed at length with the patient today.  The patient does not have concerns regarding medicines.  The following changes have been made:  no change  Labs/ tests ordered today include:   Orders Placed This Encounter  Procedures   EKG 12-Lead    Disposition:   F/U with me in 12  months  Signed, Lauree Chandler, MD 02/21/2022 2:34 PM    [facility] [place], [place], [place]  [address] Phone: [phone]; Fax: [phone]

## 2022-03-12 ENCOUNTER — Other Ambulatory Visit: Payer: Self-pay | Admitting: Internal Medicine

## 2022-03-12 NOTE — Telephone Encounter (Signed)
Please refill as per office routine med refill policy (all routine meds to be refilled for 3 mo or monthly (per pt preference) up to one year from last visit, then month to month grace period for 3 mo, then further med refills will have to be denied) ? ?

## 2022-03-25 ENCOUNTER — Other Ambulatory Visit: Payer: Self-pay | Admitting: Internal Medicine

## 2022-03-25 NOTE — Telephone Encounter (Signed)
Please refill as per office routine med refill policy (all routine meds to be refilled for 3 mo or monthly (per pt preference) up to one year from last visit, then month to month grace period for 3 mo, then further med refills will have to be denied) ? ?

## 2022-04-17 ENCOUNTER — Other Ambulatory Visit: Payer: Self-pay | Admitting: Internal Medicine

## 2022-04-17 NOTE — Telephone Encounter (Signed)
Please refill as per office routine med refill policy (all routine meds to be refilled for 3 mo or monthly (per pt preference) up to one year from last visit, then month to month grace period for 3 mo, then further med refills will have to be denied) ? ?

## 2022-04-29 ENCOUNTER — Ambulatory Visit (INDEPENDENT_AMBULATORY_CARE_PROVIDER_SITE_OTHER): Payer: Medicare HMO | Admitting: Internal Medicine

## 2022-04-29 VITALS — BP 132/74 | HR 63 | Temp 98.0°F | Ht 71.0 in | Wt 160.0 lb

## 2022-04-29 DIAGNOSIS — E559 Vitamin D deficiency, unspecified: Secondary | ICD-10-CM | POA: Diagnosis not present

## 2022-04-29 DIAGNOSIS — K5090 Crohn's disease, unspecified, without complications: Secondary | ICD-10-CM | POA: Diagnosis not present

## 2022-04-29 DIAGNOSIS — E538 Deficiency of other specified B group vitamins: Secondary | ICD-10-CM

## 2022-04-29 DIAGNOSIS — R7302 Impaired glucose tolerance (oral): Secondary | ICD-10-CM | POA: Diagnosis not present

## 2022-04-29 DIAGNOSIS — Z23 Encounter for immunization: Secondary | ICD-10-CM | POA: Diagnosis not present

## 2022-04-29 DIAGNOSIS — J449 Chronic obstructive pulmonary disease, unspecified: Secondary | ICD-10-CM | POA: Diagnosis not present

## 2022-04-29 DIAGNOSIS — H6121 Impacted cerumen, right ear: Secondary | ICD-10-CM | POA: Diagnosis not present

## 2022-04-29 DIAGNOSIS — N1831 Chronic kidney disease, stage 3a: Secondary | ICD-10-CM

## 2022-04-29 DIAGNOSIS — H6120 Impacted cerumen, unspecified ear: Secondary | ICD-10-CM

## 2022-04-29 DIAGNOSIS — I10 Essential (primary) hypertension: Secondary | ICD-10-CM

## 2022-04-29 DIAGNOSIS — I130 Hypertensive heart and chronic kidney disease with heart failure and stage 1 through stage 4 chronic kidney disease, or unspecified chronic kidney disease: Secondary | ICD-10-CM | POA: Diagnosis not present

## 2022-04-29 DIAGNOSIS — I509 Heart failure, unspecified: Secondary | ICD-10-CM | POA: Diagnosis not present

## 2022-04-29 NOTE — Progress Notes (Signed)
Patient ID: Gary Levy, male   DOB: 1940/08/29, 81 y.o.   MRN: [id number]        Chief Complaint: follow up low vit d, right ear wax impaction,        HPI:  Gary Levy is a 81 y.o. male here with reduced hearing right ear for over 1 wk.  Pt denies chest pain, increased sob or doe, wheezing, orthopnea, PND, increased LE swelling, palpitations, dizziness or syncope.   Pt denies polydipsia, polyuria, or new focal neuro s/s.   Pt denies fever, wt loss, night sweats, loss of appetite, or other constitutional symptoms   Mow 4 lawns per wk - 1 with push, other with riding.  Dropped some wt with less appetite with aging.   Not taking Vit D Wt Readings from Last 3 Encounters:  04/29/22 160 lb (72.6 kg)  02/21/22 161 lb 6.4 oz (73.2 kg)  10/28/21 168 lb (76.2 kg)   BP Readings from Last 3 Encounters:  04/29/22 132/74  02/21/22 (!) 148/76  10/28/21 (!) 152/80         Past Medical History:  Diagnosis Date   Alcohol abuse    hx   Anemia, iron deficiency    AR (allergic rhinitis)    BPH (benign prostatic hypertrophy)    CAD (coronary artery disease) of artery bypass graft    CHF (congestive heart failure) (HCC)    CKD (chronic kidney disease) 09/30/2011   COPD (chronic obstructive pulmonary disease) (HCC)    Crohn's disease ([place])    Dysuria    ED (erectile dysfunction) of organic origin    Fatigue    Glaucoma    Glucose intolerance (impaired glucose tolerance)    HLD (hyperlipidemia)    HTN (hypertension)    Impaired glucose tolerance 09/27/2011   MI (myocardial infarction) (HCC)    hx   Moderate or severe vision impairment, both eyes, impairment level not further specified    Postherpetic neuralgia    PSA elevation    Routine general medical examination at a health care facility    Shingles    Skin cancer    hx; non-melanoma   Special screening for malignant neoplasm of prostate    Urinary frequency    Past Surgical History:  Procedure Laterality Date   CORONARY ANGIOPLASTY  WITH STENT PLACEMENT     CORONARY ARTERY BYPASS GRAFT     11+ years ago; has an EF of about 45%, last Myoview showed no ischemia 12/30/06   [facility]  2011   Dr. Rush Farmer    right cataract Bilateral 07/2010   with lens implant     reports that he quit smoking about 41 years ago. His smoking use included cigarettes. He has never used smokeless tobacco. He reports that he does not drink alcohol and does not use drugs. family history includes Heart disease in his mother. No Known Allergies Current Outpatient Medications on File Prior to Visit  Medication Sig Dispense Refill   Alcohol Swabs (B-D SINGLE USE SWABS REGULAR) PADS Use as directed twice daily E11.9 200 each 3   allopurinol (ZYLOPRIM) 100 MG tablet TAKE 1 TABLET EVERY DAY 90 tablet 3   aspirin 81 MG EC tablet Take 81 mg by mouth daily.     atorvastatin (LIPITOR) 80 MG tablet TAKE 1 TABLET EVERY DAY AT 6:00 PM 90 tablet 3   bismuth-metronidazole-tetracycline (PYLERA) 140-125-125 MG per capsule Take 3 capsules by mouth 4 (four) times daily -  before meals and at bedtime. 120 capsule  0   Blood Glucose Calibration (TRUE METRIX LEVEL 1) Low SOLN Use as directed once daily E11.9 3 each 3   blood glucose meter kit and supplies KIT Use daily to check blood sugar levels. 1 each 0   carvedilol (COREG) 12.5 MG tablet TAKE 1 TABLET TWICE DAILY WITH MEALS 180 tablet 3   colchicine 0.6 MG tablet TAKE 1 TABLET EVERY DAY 60 tablet 3   ezetimibe (ZETIA) 10 MG tablet TAKE 1 TABLET EVERY DAY 90 tablet 1   HYDROcodone-acetaminophen (NORCO/VICODIN) 5-325 MG tablet Take 1-2 tablets by mouth every 4 (four) hours as needed. 10 tablet 0   Multiple Vitamins-Minerals (CENTRUM PO) Take 1 tablet by mouth daily.     nitroGLYCERIN (NITROSTAT) 0.4 MG SL tablet Place 0.4 mg under the tongue every 5 (five) minutes as needed for chest pain (Call 911 at 3rd dose within 15 minutes).     predniSONE (DELTASONE) 10 MG tablet 3 tabs by mouth per day for 3 days,2tabs per day for 3  days,1tab per day for 3 days 18 tablet 0   spironolactone (ALDACTONE) 25 MG tablet TAKE 1 TABLET EVERY DAY 90 tablet 1   traMADol (ULTRAM) 50 MG tablet Take 1 tablet (50 mg total) by mouth every 6 (six) hours as needed. 30 tablet 0   triamcinolone (NASACORT) 55 MCG/ACT AERO nasal inhaler Place 2 sprays into the nose daily. 1 Inhaler 12   cetirizine (ZYRTEC) 10 MG tablet Take 1 tablet (10 mg total) by mouth daily. 30 tablet 11   No current facility-administered medications on file prior to visit.        ROS:  All others reviewed and negative.  Objective        PE:  BP 132/74 (BP Location: Left Arm, Patient Position: Sitting, Cuff Size: Large)   Pulse 63   Temp 98 F (36.7 C) (Other (Comment))   Ht 5' 11"  (1.803 m)   Wt 160 lb (72.6 kg)   SpO2 97%   BMI 22.32 kg/m                 Constitutional: Pt appears in NAD               HENT: Head: NCAT.                Right Ear: External ear normal.                 Left Ear: External ear normal.  Right ear canal wax impaction resolved with irrigation               Eyes: . Pupils are equal, round, and reactive to light. Conjunctivae and EOM are normal               Nose: without d/c or deformity               Neck: Neck supple. Gross normal ROM               Cardiovascular: Normal rate and regular rhythm.                 Pulmonary/Chest: Effort normal and breath sounds without rales or wheezing.                Abd:  Soft, NT, ND, + BS, no organomegaly               Neurological: Pt is alert. At baseline orientation, motor grossly intact  Skin: Skin is warm. No rashes, no other new lesions, LE edema - none               Psychiatric: Pt behavior is normal without agitation   Micro: none  Cardiac tracings I have personally interpreted today:  none  Pertinent Radiological findings (summarize): none   Lab Results  Component Value Date   WBC 5.3 10/28/2021   HGB 11.9 (L) 10/28/2021   HCT 35.4 (L) 10/28/2021   PLT 166.0  10/28/2021   GLUCOSE 90 10/28/2021   CHOL 112 10/28/2021   TRIG 49.0 10/28/2021   HDL 42.30 10/28/2021   LDLCALC 60 10/28/2021   ALT 19 10/28/2021   AST 24 10/28/2021   NA 139 10/28/2021   K 5.4 No hemolysis seen (H) 10/28/2021   CL 105 10/28/2021   CREATININE 1.46 10/28/2021   BUN 17 10/28/2021   CO2 29 10/28/2021   TSH 3.07 10/28/2021   PSA 4.93 (H) 04/29/2021   HGBA1C 6.1 10/28/2021   MICROALBUR 0.4 03/24/2012   Assessment/Plan:  Cliford Sequeira is a 81 y.o. Black or African American [2] male with  has a past medical history of Alcohol abuse, Anemia, iron deficiency, AR (allergic rhinitis), BPH (benign prostatic hypertrophy), CAD (coronary artery disease) of artery bypass graft, CHF (congestive heart failure) ([place]), CKD (chronic kidney disease) (09/30/2011), COPD (chronic obstructive pulmonary disease) ([place]), Crohn's disease ([place]), Dysuria, ED (erectile dysfunction) of organic origin, Fatigue, Glaucoma, Glucose intolerance (impaired glucose tolerance), HLD (hyperlipidemia), HTN (hypertension), Impaired glucose tolerance (09/27/2011), MI (myocardial infarction) ([place]), Moderate or severe vision impairment, both eyes, impairment level not further specified, Postherpetic neuralgia, PSA elevation, Routine general medical examination at a health care facility, Shingles, Skin cancer, Special screening for malignant neoplasm of prostate, and Urinary frequency.  Vitamin D deficiency Last vitamin D Lab Results  Component Value Date   VD25OH 15.17 (L) 10/28/2021   Low, reminded to start oral replacement   CKD (chronic kidney disease) Lab Results  Component Value Date   CREATININE 1.46 10/28/2021   Stable overall, cont to avoid nephrotoxins   Essential hypertension BP Readings from Last 3 Encounters:  04/29/22 132/74  02/21/22 (!) 148/76  10/28/21 (!) 152/80   Stable, pt to continue medical treatment coreg 12.5 mg bid   Impaired glucose tolerance Lab Results  Component Value Date    HGBA1C 6.1 10/28/2021   Stable, pt to continue current medical treatment  - diet, wt control, excercise   Wax in ear With wax impaction - for irrigation  Ceruminosis is noted right ear.  Wax is removed by syringing and manual debridement. Instructions for home care to prevent wax buildup are given.  Followup: Return in about 6 months (around 10/28/2022).  Cathlean Cower, MD 05/01/2022 10:16 PM [facility]

## 2022-04-29 NOTE — Assessment & Plan Note (Signed)
Last vitamin D Lab Results  Component Value Date   VD25OH 15.17 (L) 10/28/2021   Low, reminded to start oral replacement

## 2022-04-29 NOTE — Patient Instructions (Signed)
Your right ear was irrigated of wax today  You had the flu shot today  Please continue all other medications as before, and refills have been done if requested.  Please have the pharmacy call with any other refills you may need.  Please continue your efforts at being more active, low cholesterol diet, and weight control.  Please keep your appointments with your specialists as you may have planned  Please make an Appointment to return in 6 months, or sooner if needed, also with Lab Appointment for testing done 3-5 days before at the [place] (so this is for TWO appointments - please see the scheduling desk as you leave)

## 2022-05-01 ENCOUNTER — Encounter: Payer: Self-pay | Admitting: Internal Medicine

## 2022-05-01 DIAGNOSIS — H6120 Impacted cerumen, unspecified ear: Secondary | ICD-10-CM | POA: Insufficient documentation

## 2022-05-01 NOTE — Assessment & Plan Note (Addendum)
With wax impaction - for irrigation  Ceruminosis is noted right ear.  Wax is removed by syringing and manual debridement. Instructions for home care to prevent wax buildup are given.

## 2022-05-01 NOTE — Assessment & Plan Note (Signed)
Lab Results  Component Value Date   CREATININE 1.46 10/28/2021   Stable overall, cont to avoid nephrotoxins

## 2022-05-01 NOTE — Assessment & Plan Note (Signed)
Lab Results  Component Value Date   HGBA1C 6.1 10/28/2021   Stable, pt to continue current medical treatment  - diet, wt control, excercise

## 2022-05-01 NOTE — Assessment & Plan Note (Signed)
BP Readings from Last 3 Encounters:  04/29/22 132/74  02/21/22 (!) 148/76  10/28/21 (!) 152/80   Stable, pt to continue medical treatment coreg 12.5 mg bid

## 2022-05-19 ENCOUNTER — Ambulatory Visit: Payer: Medicare HMO | Admitting: Cardiovascular Disease

## 2022-05-23 ENCOUNTER — Ambulatory Visit: Payer: Medicare HMO | Admitting: Cardiovascular Disease

## 2022-08-20 DIAGNOSIS — D23122 Other benign neoplasm of skin of left lower eyelid, including canthus: Secondary | ICD-10-CM | POA: Diagnosis not present

## 2022-08-20 DIAGNOSIS — H40013 Open angle with borderline findings, low risk, bilateral: Secondary | ICD-10-CM | POA: Diagnosis not present

## 2022-08-20 DIAGNOSIS — H04123 Dry eye syndrome of bilateral lacrimal glands: Secondary | ICD-10-CM | POA: Diagnosis not present

## 2022-08-20 DIAGNOSIS — H43813 Vitreous degeneration, bilateral: Secondary | ICD-10-CM | POA: Diagnosis not present

## 2022-09-30 ENCOUNTER — Telehealth: Payer: Self-pay | Admitting: *Deleted

## 2022-09-30 NOTE — Progress Notes (Signed)
  Care Coordination   Note   09/30/2022 Name: Bliss Tsang MRN: [id number] DOB: 1940-10-29  Braysen Cloward is a 82 y.o. year old male who sees Biagio Borg, MD for primary care. I reached out to Dimas Aguas by phone today to offer care coordination services.  Mr. Higginbotham was given information about Care Coordination services today including:   The Care Coordination services include support from the care team which includes your Nurse Coordinator, Clinical Social Worker, or Pharmacist.  The Care Coordination team is here to help remove barriers to the health concerns and goals most important to you. Care Coordination services are voluntary, and the patient may decline or stop services at any time by request to their care team member.   Care Coordination Consent Status: Patient agreed to services and verbal consent obtained.   Follow up plan:  Telephone appointment with care coordination team member scheduled for:  10/07/2022  Encounter Outcome:  Pt. Scheduled  Julian Hy, [place] Direct Dial: [phone]

## 2022-10-07 ENCOUNTER — Ambulatory Visit: Payer: Self-pay

## 2022-10-07 NOTE — Patient Outreach (Signed)
  Care Coordination   Initial Visit Note   10/07/2022 Name: Gary Levy MRN: [id number] DOB: Jan 27, 1941  Gary Levy is a 82 y.o. year old male who sees Biagio Borg, MD for primary care. I spoke with  Dimas Aguas by phone today.  What matters to the patients health and wellness today?  Patient denies any care coordination, disease management or resource needs at this time.    Goals Addressed             This Visit's Progress    COMPLETED: Care Coordination Activities       Interventions Today    Flowsheet Row Most Recent Value  Chronic Disease   Chronic disease during today's visit Hypertension (HTN), Chronic Obstructive Pulmonary Disease (COPD), Congestive Heart Failure (CHF)  General Interventions   General Interventions Discussed/Reviewed General Interventions Discussed, Durable Medical Equipment (DME), Vaccines  [assessed if any concerns regarding COPD, HF, HTN management.]  Vaccines COVID-19, Shingles, Tetanus/Pertussis/Diphtheria  [reifnoreced has not had or is due. discussed importance of prevention of shingles. encouraged to discuss with provider.]  Durable Medical Equipment (DME) BP Cuff  Exercise Interventions   Exercise Discussed/Reviewed Exercise Discussed  [encouraged to continue to excercise-ptaient states walks and plays with grandchildren]  Education Interventions   Education Provided Provided Education  [discussed importance of daily weight with HF. discussed importance of excercise, diet and weight control in managing health conditions. discussed importance of routinely monitoring blood pressure and knowing target blood pressure range.]  Pharmacy Interventions   Pharmacy Dicussed/Reviewed Pharmacy Topics Discussed            SDOH assessments and interventions completed:  Yes  SDOH Interventions Today    Flowsheet Row Most Recent Value  SDOH Interventions   Food Insecurity Interventions Intervention Not Indicated  Housing Interventions  Intervention Not Indicated  Transportation Interventions Intervention Not Indicated  Utilities Interventions Intervention Not Indicated     Care Coordination Interventions:  Yes, provided   Follow up plan: No further intervention required.   Encounter Outcome:  Pt. Visit Completed   Thea Silversmith, RN, MSN, BSN, [place] Coordinator [phone]

## 2022-10-23 ENCOUNTER — Other Ambulatory Visit (INDEPENDENT_AMBULATORY_CARE_PROVIDER_SITE_OTHER): Payer: Medicare HMO

## 2022-10-23 DIAGNOSIS — R7302 Impaired glucose tolerance (oral): Secondary | ICD-10-CM | POA: Diagnosis not present

## 2022-10-23 DIAGNOSIS — E559 Vitamin D deficiency, unspecified: Secondary | ICD-10-CM

## 2022-10-23 DIAGNOSIS — I10 Essential (primary) hypertension: Secondary | ICD-10-CM

## 2022-10-23 DIAGNOSIS — E538 Deficiency of other specified B group vitamins: Secondary | ICD-10-CM

## 2022-10-23 LAB — URINALYSIS, ROUTINE W REFLEX MICROSCOPIC
Bilirubin Urine: NEGATIVE
Hgb urine dipstick: NEGATIVE
Ketones, ur: NEGATIVE
Leukocytes,Ua: NEGATIVE
Nitrite: NEGATIVE
RBC / HPF: NONE SEEN (ref 0–?)
Specific Gravity, Urine: 1.02 (ref 1.000–1.030)
Total Protein, Urine: NEGATIVE
Urine Glucose: NEGATIVE
Urobilinogen, UA: 0.2 (ref 0.0–1.0)
pH: 6 (ref 5.0–8.0)

## 2022-10-23 LAB — HEPATIC FUNCTION PANEL
ALT: 18 U/L (ref 0–53)
AST: 24 U/L (ref 0–37)
Albumin: 3.9 g/dL (ref 3.5–5.2)
Alkaline Phosphatase: 62 U/L (ref 39–117)
Bilirubin, Direct: 0.3 mg/dL (ref 0.0–0.3)
Total Bilirubin: 1.1 mg/dL (ref 0.2–1.2)
Total Protein: 7.5 g/dL (ref 6.0–8.3)

## 2022-10-23 LAB — CBC WITH DIFFERENTIAL/PLATELET
Basophils Absolute: 0.1 10*3/uL (ref 0.0–0.1)
Basophils Relative: 2 % (ref 0.0–3.0)
Eosinophils Absolute: 0.2 10*3/uL (ref 0.0–0.7)
Eosinophils Relative: 5.4 % — ABNORMAL HIGH (ref 0.0–5.0)
HCT: 38 % — ABNORMAL LOW (ref 39.0–52.0)
Hemoglobin: 12.5 g/dL — ABNORMAL LOW (ref 13.0–17.0)
Lymphocytes Relative: 30 % (ref 12.0–46.0)
Lymphs Abs: 1.1 10*3/uL (ref 0.7–4.0)
MCHC: 33 g/dL (ref 30.0–36.0)
MCV: 88.4 fl (ref 78.0–100.0)
Monocytes Absolute: 0.5 10*3/uL (ref 0.1–1.0)
Monocytes Relative: 13.1 % — ABNORMAL HIGH (ref 3.0–12.0)
Neutro Abs: 1.8 10*3/uL (ref 1.4–7.7)
Neutrophils Relative %: 49.5 % (ref 43.0–77.0)
Platelets: 177 10*3/uL (ref 150.0–400.0)
RBC: 4.29 Mil/uL (ref 4.22–5.81)
RDW: 15 % (ref 11.5–15.5)
WBC: 3.6 10*3/uL — ABNORMAL LOW (ref 4.0–10.5)

## 2022-10-23 LAB — BASIC METABOLIC PANEL
BUN: 23 mg/dL (ref 6–23)
CO2: 29 mEq/L (ref 19–32)
Calcium: 10.1 mg/dL (ref 8.4–10.5)
Chloride: 107 mEq/L (ref 96–112)
Creatinine, Ser: 1.56 mg/dL — ABNORMAL HIGH (ref 0.40–1.50)
GFR: 41.24 mL/min — ABNORMAL LOW (ref >60.00)
Glucose, Bld: 103 mg/dL — ABNORMAL HIGH (ref 70–99)
Potassium: 5.2 mEq/L — ABNORMAL HIGH (ref 3.5–5.1)
Sodium: 142 mEq/L (ref 135–145)

## 2022-10-23 LAB — VITAMIN D 25 HYDROXY (VIT D DEFICIENCY, FRACTURES): VITD: 13.13 ng/mL — ABNORMAL LOW (ref 30.00–100.00)

## 2022-10-23 LAB — LIPID PANEL
Cholesterol: 110 mg/dL (ref 0–200)
HDL: 44.2 mg/dL (ref >39.00)
LDL Cholesterol: 58 mg/dL (ref 0–99)
NonHDL: 65.32
Total CHOL/HDL Ratio: 2
Triglycerides: 36 mg/dL (ref 0.0–149.0)
VLDL: 7.2 mg/dL (ref 0.0–40.0)

## 2022-10-23 LAB — TSH: TSH: 4.51 u[IU]/mL (ref 0.35–5.50)

## 2022-10-23 LAB — MICROALBUMIN / CREATININE URINE RATIO
Creatinine,U: 98.6 mg/dL
Microalb Creat Ratio: 1.2 mg/g (ref 0.0–30.0)
Microalb, Ur: 1.2 mg/dL (ref 0.0–1.9)

## 2022-10-23 LAB — HEMOGLOBIN A1C: Hgb A1c MFr Bld: 5.9 % (ref 4.6–6.5)

## 2022-10-23 LAB — VITAMIN B12: Vitamin B-12: 355 pg/mL (ref 211–911)

## 2022-10-27 ENCOUNTER — Other Ambulatory Visit: Payer: Self-pay | Admitting: Cardiovascular Disease

## 2022-10-28 ENCOUNTER — Encounter: Payer: Self-pay | Admitting: Internal Medicine

## 2022-10-28 ENCOUNTER — Ambulatory Visit (INDEPENDENT_AMBULATORY_CARE_PROVIDER_SITE_OTHER): Payer: Medicare HMO | Admitting: Internal Medicine

## 2022-10-28 VITALS — BP 122/68 | HR 55 | Temp 97.8°F | Ht 71.0 in | Wt 159.0 lb

## 2022-10-28 DIAGNOSIS — R7302 Impaired glucose tolerance (oral): Secondary | ICD-10-CM | POA: Diagnosis not present

## 2022-10-28 DIAGNOSIS — E7800 Pure hypercholesterolemia, unspecified: Secondary | ICD-10-CM

## 2022-10-28 DIAGNOSIS — Z0001 Encounter for general adult medical examination with abnormal findings: Secondary | ICD-10-CM | POA: Diagnosis not present

## 2022-10-28 DIAGNOSIS — E559 Vitamin D deficiency, unspecified: Secondary | ICD-10-CM

## 2022-10-28 DIAGNOSIS — I10 Essential (primary) hypertension: Secondary | ICD-10-CM | POA: Diagnosis not present

## 2022-10-28 DIAGNOSIS — N1831 Chronic kidney disease, stage 3a: Secondary | ICD-10-CM | POA: Diagnosis not present

## 2022-10-28 NOTE — Progress Notes (Signed)
Patient ID: Gary Levy, male   DOB: 1940/12/28, 82 y.o.   MRN: [id number]         Chief Complaint:: wellness exam and low vit d, ckd, htn, hld, hyperglycemia       HPI:  Gary Levy is a 82 y.o. male here for wellness exam; declines tdap, covid booster, but for shingrix at pharmacy, o/w up to date                Also not taking Vit d.  Pt denies chest pain, increased sob or doe, wheezing, orthopnea, PND, increased LE swelling, palpitations, dizziness or syncope.   Pt denies polydipsia, polyuria, or new focal neuro s/s.    Pt denies fever, wt loss, night sweats, loss of appetite, or other constitutional symptoms     Wt Readings from Last 3 Encounters:  10/28/22 159 lb (72.1 kg)  04/29/22 160 lb (72.6 kg)  02/21/22 161 lb 6.4 oz (73.2 kg)   BP Readings from Last 3 Encounters:  10/28/22 122/68  04/29/22 132/74  02/21/22 (!) 148/76   Immunization History  Administered Date(s) Administered   Fluad Quad(high Dose 65+) 04/15/2019, 06/02/2020, 04/29/2021, 04/29/2022   Influenza Split 05/05/2012   Influenza Whole 08/15/2009, 06/11/2010   Influenza, High Dose Seasonal PF 06/07/2013, 04/12/2015, 07/01/2016, 04/14/2017, 04/16/2018   Influenza,inj,Quad PF,6+ Mos 04/11/2014   PFIZER(Purple Top)SARS-COV-2 Vaccination 09/23/2019, 10/14/2019, 05/28/2020   Pneumococcal Conjugate-13 10/12/2013   Pneumococcal Polysaccharide-23 08/19/1995, 08/15/2009   Td 08/19/1995, 08/15/2009   Health Maintenance Due  Topic Date Due   DTaP/Tdap/Td (3 - Tdap) 08/16/2019      Past Medical History:  Diagnosis Date   Alcohol abuse    hx   Anemia, iron deficiency    AR (allergic rhinitis)    BPH (benign prostatic hypertrophy)    CAD (coronary artery disease) of artery bypass graft    CHF (congestive heart failure) (HCC)    CKD (chronic kidney disease) 09/30/2011   COPD (chronic obstructive pulmonary disease) (HCC)    Crohn's disease ([place])    Dysuria    ED (erectile dysfunction) of organic origin     Fatigue    Glaucoma    Glucose intolerance (impaired glucose tolerance)    HLD (hyperlipidemia)    HTN (hypertension)    Impaired glucose tolerance 09/27/2011   MI (myocardial infarction) (HCC)    hx   Moderate or severe vision impairment, both eyes, impairment level not further specified    Postherpetic neuralgia    PSA elevation    Routine general medical examination at a health care facility    Shingles    Skin cancer    hx; non-melanoma   Special screening for malignant neoplasm of prostate    Urinary frequency    Past Surgical History:  Procedure Laterality Date   CORONARY ANGIOPLASTY WITH STENT PLACEMENT     CORONARY ARTERY BYPASS GRAFT     11+ years ago; has an EF of about 45%, last Myoview showed no ischemia 12/30/06   [facility]  2011   Dr. Rush Farmer    right cataract Bilateral 07/2010   with lens implant     reports that he quit smoking about 41 years ago. His smoking use included cigarettes. He has never used smokeless tobacco. He reports that he does not drink alcohol and does not use drugs. family history includes Heart disease in his mother. No Known Allergies Current Outpatient Medications on File Prior to Visit  Medication Sig Dispense Refill   Alcohol Swabs (B-D SINGLE  USE SWABS REGULAR) PADS Use as directed twice daily E11.9 200 each 3   allopurinol (ZYLOPRIM) 100 MG tablet TAKE 1 TABLET EVERY DAY 90 tablet 3   aspirin 81 MG EC tablet Take 81 mg by mouth daily.     atorvastatin (LIPITOR) 80 MG tablet TAKE 1 TABLET EVERY DAY AT 6:00 PM 90 tablet 3   bismuth-metronidazole-tetracycline (PYLERA) 140-125-125 MG per capsule Take 3 capsules by mouth 4 (four) times daily -  before meals and at bedtime. 120 capsule 0   Blood Glucose Calibration (TRUE METRIX LEVEL 1) Low SOLN Use as directed once daily E11.9 3 each 3   blood glucose meter kit and supplies KIT Use daily to check blood sugar levels. 1 each 0   carvedilol (COREG) 12.5 MG tablet TAKE 1 TABLET TWICE DAILY WITH MEALS  180 tablet 3   colchicine 0.6 MG tablet TAKE 1 TABLET EVERY DAY 60 tablet 3   ezetimibe (ZETIA) 10 MG tablet TAKE 1 TABLET EVERY DAY 90 tablet 1   HYDROcodone-acetaminophen (NORCO/VICODIN) 5-325 MG tablet Take 1-2 tablets by mouth every 4 (four) hours as needed. 10 tablet 0   Multiple Vitamins-Minerals (CENTRUM PO) Take 1 tablet by mouth daily.     nitroGLYCERIN (NITROSTAT) 0.4 MG SL tablet Place 0.4 mg under the tongue every 5 (five) minutes as needed for chest pain (Call 911 at 3rd dose within 15 minutes).     predniSONE (DELTASONE) 10 MG tablet 3 tabs by mouth per day for 3 days,2tabs per day for 3 days,1tab per day for 3 days 18 tablet 0   spironolactone (ALDACTONE) 25 MG tablet TAKE 1 TABLET EVERY DAY 90 tablet 1   traMADol (ULTRAM) 50 MG tablet Take 1 tablet (50 mg total) by mouth every 6 (six) hours as needed. 30 tablet 0   triamcinolone (NASACORT) 55 MCG/ACT AERO nasal inhaler Place 2 sprays into the nose daily. 1 Inhaler 12   cetirizine (ZYRTEC) 10 MG tablet Take 1 tablet (10 mg total) by mouth daily. 30 tablet 11   No current facility-administered medications on file prior to visit.        ROS:  All others reviewed and negative.  Objective        PE:  BP 122/68   Pulse (!) 55   Temp 97.8 F (36.6 C) (Oral)   Ht 5\' 11"  (1.803 m)   Wt 159 lb (72.1 kg)   SpO2 97%   BMI 22.18 kg/m                 Constitutional: Pt appears in NAD               HENT: Head: NCAT.                Right Ear: External ear normal.                 Left Ear: External ear normal.                Eyes: . Pupils are equal, round, and reactive to light. Conjunctivae and EOM are normal               Nose: without d/c or deformity               Neck: Neck supple. Gross normal ROM               Cardiovascular: Normal rate and regular rhythm.  Pulmonary/Chest: Effort normal and breath sounds without rales or wheezing.                Abd:  Soft, NT, ND, + BS, no organomegaly                Neurological: Pt is alert. At baseline orientation, motor grossly intact               Skin: Skin is warm. No rashes, no other new lesions, LE edema - trace right pedal               Psychiatric: Pt behavior is normal without agitation   Micro: none  Cardiac tracings I have personally interpreted today:  none  Pertinent Radiological findings (summarize): none   Lab Results  Component Value Date   WBC 3.6 (L) 10/23/2022   HGB 12.5 (L) 10/23/2022   HCT 38.0 (L) 10/23/2022   PLT 177.0 10/23/2022   GLUCOSE 103 (H) 10/23/2022   CHOL 110 10/23/2022   TRIG 36.0 10/23/2022   HDL 44.20 10/23/2022   LDLCALC 58 10/23/2022   ALT 18 10/23/2022   AST 24 10/23/2022   NA 142 10/23/2022   K 5.2 No hemolysis seen (H) 10/23/2022   CL 107 10/23/2022   CREATININE 1.56 (H) 10/23/2022   BUN 23 10/23/2022   CO2 29 10/23/2022   TSH 4.51 10/23/2022   PSA 4.93 (H) 04/29/2021   HGBA1C 5.9 10/23/2022   MICROALBUR 1.2 10/23/2022   Assessment/Plan:  Gary Levy is a 82 y.o. Black or African American [2] male with  has a past medical history of Alcohol abuse, Anemia, iron deficiency, AR (allergic rhinitis), BPH (benign prostatic hypertrophy), CAD (coronary artery disease) of artery bypass graft, CHF (congestive heart failure) ([place]), CKD (chronic kidney disease) (09/30/2011), COPD (chronic obstructive pulmonary disease) ([place]), Crohn's disease ([place]), Dysuria, ED (erectile dysfunction) of organic origin, Fatigue, Glaucoma, Glucose intolerance (impaired glucose tolerance), HLD (hyperlipidemia), HTN (hypertension), Impaired glucose tolerance (09/27/2011), MI (myocardial infarction) ([place]), Moderate or severe vision impairment, both eyes, impairment level not further specified, Postherpetic neuralgia, PSA elevation, Routine general medical examination at a health care facility, Shingles, Skin cancer, Special screening for malignant neoplasm of prostate, and Urinary frequency.  Encounter for well adult exam with  abnormal findings Age and sex appropriate education and counseling updated with regular exercise and diet Referrals for preventative services - none needed Immunizations addressed - declines tdap, covid booster, but for shingrix at pharmacy Smoking counseling  - none needed Evidence for depression or other mood disorder - none significant Most recent labs reviewed. I have personally reviewed and have noted: 1) the patient's medical and social history 2) The patient's current medications and supplements 3) The patient's height, weight, and BMI have been recorded in the chart   CKD (chronic kidney disease) Lab Results  Component Value Date   CREATININE 1.56 (H) 10/23/2022   Stable overall, cont to avoid nephrotoxins   Essential hypertension BP Readings from Last 3 Encounters:  10/28/22 122/68  04/29/22 132/74  02/21/22 (!) 148/76   Stable, pt to continue medical treatment coreg 12.5 bid   Hyperlipidemia Lab Results  Component Value Date   LDLCALC 58 10/23/2022   Stable, pt to continue current statin lipitor 80 mg   Impaired glucose tolerance Lab Results  Component Value Date   HGBA1C 5.9 10/23/2022   Stable, pt to continue current medical treatment - diet, wt control   Vitamin D deficiency Last vitamin D Lab Results  Component Value  Date   VD25OH 13.13 (L) 10/23/2022   Low, to start oral replacement  Followup: Return in about 6 months (around 04/30/2023).  Cathlean Cower, MD 10/31/2022 4:49 AM [facility]

## 2022-10-28 NOTE — Patient Instructions (Addendum)
Please have your Shingrix (shingles) shots done at your local pharmacy.  Please take OTC Vitamin D3 at 2000 units per day, indefinitely  Please continue all other medications as before, and refills have been done if requested.  Please have the pharmacy call with any other refills you may need.  Please continue your efforts at being more active, low cholesterol diet, and weight control.  You are otherwise up to date with prevention measures today.  Please keep your appointments with your specialists as you may have planned  Your lab work was stable this visit  Please make an Appointment to return in 6 months, or sooner if needed, also with Lab Appointment for testing done 3-5 days before at the [place] [facility] (so this is for TWO appointments - please see the scheduling desk as you leave)

## 2022-10-31 ENCOUNTER — Encounter: Payer: Self-pay | Admitting: Internal Medicine

## 2022-10-31 NOTE — Assessment & Plan Note (Signed)
Lab Results  Component Value Date   LDLCALC 58 10/23/2022   Stable, pt to continue current statin lipitor 80 mg

## 2022-10-31 NOTE — Assessment & Plan Note (Signed)
Lab Results  Component Value Date   HGBA1C 5.9 10/23/2022   Stable, pt to continue current medical treatment - diet, wt control

## 2022-10-31 NOTE — Assessment & Plan Note (Signed)
Last vitamin D Lab Results  Component Value Date   VD25OH 13.13 (L) 10/23/2022   Low, to start oral replacement

## 2022-10-31 NOTE — Assessment & Plan Note (Signed)
Lab Results  Component Value Date   CREATININE 1.56 (H) 10/23/2022   Stable overall, cont to avoid nephrotoxins

## 2022-10-31 NOTE — Assessment & Plan Note (Signed)
BP Readings from Last 3 Encounters:  10/28/22 122/68  04/29/22 132/74  02/21/22 (!) 148/76   Stable, pt to continue medical treatment coreg 12.5 bid

## 2022-10-31 NOTE — Assessment & Plan Note (Signed)
Age and sex appropriate education and counseling updated with regular exercise and diet Referrals for preventative services - none needed Immunizations addressed - declines tdap, covid booster, but for shingrix at pharmacy Smoking counseling  - none needed Evidence for depression or other mood disorder - none significant Most recent labs reviewed. I have personally reviewed and have noted: 1) the patient's medical and social history 2) The patient's current medications and supplements 3) The patient's height, weight, and BMI have been recorded in the chart

## 2022-11-07 ENCOUNTER — Other Ambulatory Visit (INDEPENDENT_AMBULATORY_CARE_PROVIDER_SITE_OTHER): Payer: Medicare HMO

## 2022-11-07 DIAGNOSIS — E7800 Pure hypercholesterolemia, unspecified: Secondary | ICD-10-CM

## 2022-11-07 DIAGNOSIS — E559 Vitamin D deficiency, unspecified: Secondary | ICD-10-CM | POA: Diagnosis not present

## 2022-11-07 DIAGNOSIS — R7302 Impaired glucose tolerance (oral): Secondary | ICD-10-CM | POA: Diagnosis not present

## 2022-11-07 LAB — LIPID PANEL
Cholesterol: 109 mg/dL (ref 0–200)
HDL: 45.4 mg/dL (ref >39.00)
LDL Cholesterol: 55 mg/dL (ref 0–99)
NonHDL: 63.49
Total CHOL/HDL Ratio: 2
Triglycerides: 44 mg/dL (ref 0.0–149.0)
VLDL: 8.8 mg/dL (ref 0.0–40.0)

## 2022-11-07 LAB — HEPATIC FUNCTION PANEL
ALT: 16 U/L (ref 0–53)
AST: 25 U/L (ref 0–37)
Albumin: 4.2 g/dL (ref 3.5–5.2)
Alkaline Phosphatase: 61 U/L (ref 39–117)
Bilirubin, Direct: 0.3 mg/dL (ref 0.0–0.3)
Total Bilirubin: 1.4 mg/dL — ABNORMAL HIGH (ref 0.2–1.2)
Total Protein: 7.7 g/dL (ref 6.0–8.3)

## 2022-11-07 LAB — HEMOGLOBIN A1C: Hgb A1c MFr Bld: 5.9 % (ref 4.6–6.5)

## 2022-11-07 LAB — BASIC METABOLIC PANEL
BUN: 23 mg/dL (ref 6–23)
CO2: 28 mEq/L (ref 19–32)
Calcium: 10.3 mg/dL (ref 8.4–10.5)
Chloride: 106 mEq/L (ref 96–112)
Creatinine, Ser: 1.61 mg/dL — ABNORMAL HIGH (ref 0.40–1.50)
GFR: 39.7 mL/min — ABNORMAL LOW (ref >60.00)
Glucose, Bld: 96 mg/dL (ref 70–99)
Potassium: 4.9 mEq/L (ref 3.5–5.1)
Sodium: 141 mEq/L (ref 135–145)

## 2022-11-07 LAB — VITAMIN D 25 HYDROXY (VIT D DEFICIENCY, FRACTURES): VITD: 16.72 ng/mL — ABNORMAL LOW (ref 30.00–100.00)

## 2022-11-11 ENCOUNTER — Ambulatory Visit (INDEPENDENT_AMBULATORY_CARE_PROVIDER_SITE_OTHER): Payer: Medicare HMO | Admitting: Internal Medicine

## 2022-11-11 VITALS — BP 126/72 | HR 80 | Temp 97.8°F | Ht 71.0 in | Wt 157.0 lb

## 2022-11-11 DIAGNOSIS — E7800 Pure hypercholesterolemia, unspecified: Secondary | ICD-10-CM

## 2022-11-11 DIAGNOSIS — N3281 Overactive bladder: Secondary | ICD-10-CM | POA: Diagnosis not present

## 2022-11-11 DIAGNOSIS — E559 Vitamin D deficiency, unspecified: Secondary | ICD-10-CM | POA: Diagnosis not present

## 2022-11-11 DIAGNOSIS — I10 Essential (primary) hypertension: Secondary | ICD-10-CM

## 2022-11-11 DIAGNOSIS — N1831 Chronic kidney disease, stage 3a: Secondary | ICD-10-CM | POA: Diagnosis not present

## 2022-11-11 MED ORDER — SOLIFENACIN SUCCINATE 5 MG PO TABS: 5.0000 mg | ORAL_TABLET | Freq: Every day | ORAL | 3 refills | Status: DC

## 2022-11-11 MED ORDER — COLCHICINE 0.6 MG PO TABS: 0.6000 mg | ORAL_TABLET | Freq: Every day | ORAL | 5 refills | Status: DC

## 2022-11-11 NOTE — Patient Instructions (Signed)
Please take all new medication as prescribed - the vesicare 5 mg per day (sent to [facility])  Please continue all other medications as before, and refills have been done if requested - colchicine  Please have the pharmacy call with any other refills you may need.  Please continue your efforts at being more active, low cholesterol diet, and weight control.  Please keep your appointments with your specialists as you may have planned  Please make an Appointment to return in 6 months, or sooner if needed

## 2022-11-11 NOTE — Progress Notes (Signed)
Chief Complaint: follow up HTN, HLD and low vit d, ckd3a       HPI:  Gary Levy is a 82 y.o. male here overall doing ok;  Pt denies chest pain, increased sob or doe, wheezing, orthopnea, PND, increased LE swelling, palpitations, dizziness or syncope.   Pt denies polydipsia, polyuria, or new focal neuro s/s.    Pt denies fever, wt loss, night sweats, loss of appetite, or other constitutional symptoms  Denies urinary symptoms such as dysuria, flank pain, hematuria or n/v, fever, chills but has several months of worsening urinary frequency and urgency, with nocturia 2-3 times as well.         Wt Readings from Last 3 Encounters:  11/11/22 157 lb (71.2 kg)  10/28/22 159 lb (72.1 kg)  04/29/22 160 lb (72.6 kg)   BP Readings from Last 3 Encounters:  11/11/22 126/72  10/28/22 122/68  04/29/22 132/74         Past Medical History:  Diagnosis Date   Alcohol abuse    hx   Anemia, iron deficiency    AR (allergic rhinitis)    BPH (benign prostatic hypertrophy)    CAD (coronary artery disease) of artery bypass graft    CHF (congestive heart failure) (HCC)    CKD (chronic kidney disease) 09/30/2011   COPD (chronic obstructive pulmonary disease) (HCC)    Crohn's disease ([place])    Dysuria    ED (erectile dysfunction) of organic origin    Fatigue    Glaucoma    Glucose intolerance (impaired glucose tolerance)    HLD (hyperlipidemia)    HTN (hypertension)    Impaired glucose tolerance 09/27/2011   MI (myocardial infarction) (HCC)    hx   Moderate or severe vision impairment, both eyes, impairment level not further specified    Postherpetic neuralgia    PSA elevation    Routine general medical examination at a health care facility    Shingles    Skin cancer    hx; non-melanoma   Special screening for malignant neoplasm of prostate    Urinary frequency    Past Surgical History:  Procedure Laterality Date   CORONARY ANGIOPLASTY WITH STENT PLACEMENT     CORONARY ARTERY BYPASS GRAFT      11+ years ago; has an EF of about 45%, last Myoview showed no ischemia 12/30/06   [facility]  2011   Dr. Rush Farmer    right cataract Bilateral 07/2010   with lens implant     reports that he quit smoking about 41 years ago. His smoking use included cigarettes. He has never used smokeless tobacco. He reports that he does not drink alcohol and does not use drugs. family history includes Heart disease in his mother. No Known Allergies Current Outpatient Medications on File Prior to Visit  Medication Sig Dispense Refill   Alcohol Swabs (B-D SINGLE USE SWABS REGULAR) PADS Use as directed twice daily E11.9 200 each 3   allopurinol (ZYLOPRIM) 100 MG tablet TAKE 1 TABLET EVERY DAY 90 tablet 3   aspirin 81 MG EC tablet Take 81 mg by mouth daily.     atorvastatin (LIPITOR) 80 MG tablet TAKE 1 TABLET EVERY DAY AT 6:00 PM 90 tablet 3   bismuth-metronidazole-tetracycline (PYLERA) 140-125-125 MG per capsule Take 3 capsules by mouth 4 (four) times daily -  before meals and at bedtime. 120 capsule 0   Blood Glucose Calibration (TRUE METRIX LEVEL 1) Low SOLN Use as directed once daily E11.9  3 each 3   blood glucose meter kit and supplies KIT Use daily to check blood sugar levels. 1 each 0   carvedilol (COREG) 12.5 MG tablet TAKE 1 TABLET TWICE DAILY WITH MEALS 180 tablet 3   ezetimibe (ZETIA) 10 MG tablet TAKE 1 TABLET EVERY DAY 90 tablet 1   HYDROcodone-acetaminophen (NORCO/VICODIN) 5-325 MG tablet Take 1-2 tablets by mouth every 4 (four) hours as needed. 10 tablet 0   Multiple Vitamins-Minerals (CENTRUM PO) Take 1 tablet by mouth daily.     nitroGLYCERIN (NITROSTAT) 0.4 MG SL tablet Place 0.4 mg under the tongue every 5 (five) minutes as needed for chest pain (Call 911 at 3rd dose within 15 minutes).     predniSONE (DELTASONE) 10 MG tablet 3 tabs by mouth per day for 3 days,2tabs per day for 3 days,1tab per day for 3 days 18 tablet 0   spironolactone (ALDACTONE) 25 MG tablet TAKE 1 TABLET EVERY DAY 90 tablet 1    traMADol (ULTRAM) 50 MG tablet Take 1 tablet (50 mg total) by mouth every 6 (six) hours as needed. 30 tablet 0   triamcinolone (NASACORT) 55 MCG/ACT AERO nasal inhaler Place 2 sprays into the nose daily. 1 Inhaler 12   cetirizine (ZYRTEC) 10 MG tablet Take 1 tablet (10 mg total) by mouth daily. 30 tablet 11   No current facility-administered medications on file prior to visit.        ROS:  All others reviewed and negative.  Objective        PE:  BP 126/72   Pulse 80   Temp 97.8 F (36.6 C) (Oral)   Ht 5\' 11"  (1.803 m)   Wt 157 lb (71.2 kg)   SpO2 93%   BMI 21.90 kg/m                 Constitutional: Pt appears in NAD               HENT: Head: NCAT.                Right Ear: External ear normal.                 Left Ear: External ear normal.                Eyes: . Pupils are equal, round, and reactive to light. Conjunctivae and EOM are normal               Nose: without d/c or deformity               Neck: Neck supple. Gross normal ROM               Cardiovascular: Normal rate and regular rhythm.                 Pulmonary/Chest: Effort normal and breath sounds without rales or wheezing.                Abd:  Soft, NT, ND, + BS, no organomegaly               Neurological: Pt is alert. At baseline orientation, motor grossly intact               Skin: Skin is warm. No rashes, no other new lesions, LE edema - none               Psychiatric: Pt behavior is normal without agitation   Micro: none  Cardiac tracings  I have personally interpreted today:  none  Pertinent Radiological findings (summarize): none   Lab Results  Component Value Date   WBC 3.6 (L) 10/23/2022   HGB 12.5 (L) 10/23/2022   HCT 38.0 (L) 10/23/2022   PLT 177.0 10/23/2022   GLUCOSE 96 11/07/2022   CHOL 109 11/07/2022   TRIG 44.0 11/07/2022   HDL 45.40 11/07/2022   LDLCALC 55 11/07/2022   ALT 16 11/07/2022   AST 25 11/07/2022   NA 141 11/07/2022   K 4.9 11/07/2022   CL 106 11/07/2022   CREATININE 1.61  (H) 11/07/2022   BUN 23 11/07/2022   CO2 28 11/07/2022   TSH 4.51 10/23/2022   PSA 4.93 (H) 04/29/2021   HGBA1C 5.9 11/07/2022   MICROALBUR 1.2 10/23/2022   Assessment/Plan:  Gary Levy is a 82 y.o. Black or African American [2] male with  has a past medical history of Alcohol abuse, Anemia, iron deficiency, AR (allergic rhinitis), BPH (benign prostatic hypertrophy), CAD (coronary artery disease) of artery bypass graft, CHF (congestive heart failure) ([place]), CKD (chronic kidney disease) (09/30/2011), COPD (chronic obstructive pulmonary disease) ([place]), Crohn's disease ([place]), Dysuria, ED (erectile dysfunction) of organic origin, Fatigue, Glaucoma, Glucose intolerance (impaired glucose tolerance), HLD (hyperlipidemia), HTN (hypertension), Impaired glucose tolerance (09/27/2011), MI (myocardial infarction) ([place]), Moderate or severe vision impairment, both eyes, impairment level not further specified, Postherpetic neuralgia, PSA elevation, Routine general medical examination at a health care facility, Shingles, Skin cancer, Special screening for malignant neoplasm of prostate, and Urinary frequency.  CKD (chronic kidney disease) Lab Results  Component Value Date   CREATININE 1.61 (H) 11/07/2022   Stable overall, cont to avoid nephrotoxins  Essential hypertension BP Readings from Last 3 Encounters:  11/11/22 126/72  10/28/22 122/68  04/29/22 132/74   Stable, pt to continue medical treatment coreg 12.5 bid   Hyperlipidemia Lab Results  Component Value Date   LDLCALC 55 11/07/2022   Stable, pt to continue current statin zetia 10 mg qd, lipitor 80 mg qd  Vitamin D deficiency Last vitamin D Lab Results  Component Value Date   VD25OH 16.72 (L) 11/07/2022   Low, to start oral replacement   OAB (overactive bladder) Worsening symptoms, for UA, also start vesicare 5 mg qd Followup: Return in about 6 months (around 05/14/2023).  Cathlean Cower, MD 11/14/2022 7:14 PM [facility]

## 2022-11-14 ENCOUNTER — Encounter: Payer: Self-pay | Admitting: Internal Medicine

## 2022-11-14 DIAGNOSIS — N3281 Overactive bladder: Secondary | ICD-10-CM | POA: Insufficient documentation

## 2022-11-14 NOTE — Assessment & Plan Note (Signed)
BP Readings from Last 3 Encounters:  11/11/22 126/72  10/28/22 122/68  04/29/22 132/74   Stable, pt to continue medical treatment coreg 12.5 bid

## 2022-11-14 NOTE — Assessment & Plan Note (Signed)
Lab Results  Component Value Date   CREATININE 1.61 (H) 11/07/2022   Stable overall, cont to avoid nephrotoxins

## 2022-11-14 NOTE — Assessment & Plan Note (Signed)
Lab Results  Component Value Date   LDLCALC 55 11/07/2022   Stable, pt to continue current statin zetia 10 mg qd, lipitor 80 mg qd

## 2022-11-14 NOTE — Assessment & Plan Note (Signed)
Last vitamin D Lab Results  Component Value Date   VD25OH 16.72 (L) 11/07/2022   Low, to start oral replacement

## 2022-11-14 NOTE — Assessment & Plan Note (Signed)
Worsening symptoms, for UA, also start vesicare 5 mg qd

## 2023-01-14 ENCOUNTER — Other Ambulatory Visit: Payer: Self-pay | Admitting: Internal Medicine

## 2023-03-17 ENCOUNTER — Ambulatory Visit: Payer: Medicare HMO

## 2023-03-17 VITALS — Ht 70.5 in | Wt 157.0 lb

## 2023-03-17 DIAGNOSIS — Z0000 Encounter for general adult medical examination without abnormal findings: Secondary | ICD-10-CM

## 2023-03-17 NOTE — Patient Instructions (Addendum)
Gary Levy , Thank you for taking time to come for your Medicare Wellness Visit. I appreciate your ongoing commitment to your health goals. Please review the following plan we discussed and let me know if I can assist you in the future.   Referrals/Orders/Follow-Ups/Clinician Recommendations: Remember to get your Tdap vaccine at your upcoming visit with Dr. Jonny Ruiz.  You also can get your shingles vaccine during that time and or get it from your pharmacy.  Keep up the good work.  This is a list of the screening recommended for you and due dates:  Health Maintenance  Topic Date Due   Zoster (Shingles) Vaccine (1 of 2) Never done   DTaP/Tdap/Td vaccine (3 - Tdap) 08/16/2019   Flu Shot  03/19/2023   Medicare Annual Wellness Visit  03/16/2024   Pneumonia Vaccine  Completed   HPV Vaccine  Aged Out   COVID-19 Vaccine  Discontinued    Advanced directives: (Copy Requested) Please bring a copy of your health care power of attorney and living will to the office to be added to your chart at your convenience.  Next Medicare Annual Wellness Visit scheduled for next year: Yes  Preventive Care 56 Years and Older, Male  Preventive care refers to lifestyle choices and visits with your health care provider that can promote health and wellness. What does preventive care include? A yearly physical exam. This is also called an annual well check. Dental exams once or twice a year. Routine eye exams. Ask your health care provider how often you should have your eyes checked. Personal lifestyle choices, including: Daily care of your teeth and gums. Regular physical activity. Eating a healthy diet. Avoiding tobacco and drug use. Limiting alcohol use. Practicing safe sex. Taking low doses of aspirin every day. Taking vitamin and mineral supplements as recommended by your health care provider. What happens during an annual well check? The services and screenings done by your health care provider during your  annual well check will depend on your age, overall health, lifestyle risk factors, and family history of disease. Counseling  Your health care provider may ask you questions about your: Alcohol use. Tobacco use. Drug use. Emotional well-being. Home and relationship well-being. Sexual activity. Eating habits. History of falls. Memory and ability to understand (cognition). Work and work Astronomer. Screening  You may have the following tests or measurements: Height, weight, and BMI. Blood pressure. Lipid and cholesterol levels. These may be checked every 5 years, or more frequently if you are over 68 years old. Skin check. Lung cancer screening. You may have this screening every year starting at age 28 if you have a 30-pack-year history of smoking and currently smoke or have quit within the past 15 years. Fecal occult blood test (FOBT) of the stool. You may have this test every year starting at age 74. Flexible sigmoidoscopy or colonoscopy. You may have a sigmoidoscopy every 5 years or a colonoscopy every 10 years starting at age 30. Prostate cancer screening. Recommendations will vary depending on your family history and other risks. Hepatitis C blood test. Hepatitis B blood test. Sexually transmitted disease (STD) testing. Diabetes screening. This is done by checking your blood sugar (glucose) after you have not eaten for a while (fasting). You may have this done every 1-3 years. Abdominal aortic aneurysm (AAA) screening. You may need this if you are a current or former smoker. Osteoporosis. You may be screened starting at age 45 if you are at high risk. Talk with your health care  provider about your test results, treatment options, and if necessary, the need for more tests. Vaccines  Your health care provider may recommend certain vaccines, such as: Influenza vaccine. This is recommended every year. Tetanus, diphtheria, and acellular pertussis (Tdap, Td) vaccine. You may need a Td  booster every 10 years. Zoster vaccine. You may need this after age 13. Pneumococcal 13-valent conjugate (PCV13) vaccine. One dose is recommended after age 83. Pneumococcal polysaccharide (PPSV23) vaccine. One dose is recommended after age 66. Talk to your health care provider about which screenings and vaccines you need and how often you need them. This information is not intended to replace advice given to you by your health care provider. Make sure you discuss any questions you have with your health care provider. Document Released: 08/31/2015 Document Revised: 04/23/2016 Document Reviewed: 06/05/2015 Elsevier Interactive Patient Education  2017 [employer].  Fall Prevention in the Home Falls can cause injuries. They can happen to people of all ages. There are many things you can do to make your home safe and to help prevent falls. What can I do on the outside of my home? Regularly fix the edges of walkways and driveways and fix any cracks. Remove anything that might make you trip as you walk through a door, such as a raised step or threshold. Trim any bushes or trees on the path to your home. Use bright outdoor lighting. Clear any walking paths of anything that might make someone trip, such as rocks or tools. Regularly check to see if handrails are loose or broken. Make sure that both sides of any steps have handrails. Any raised decks and porches should have guardrails on the edges. Have any leaves, snow, or ice cleared regularly. Use sand or salt on walking paths during winter. Clean up any spills in your garage right away. This includes oil or grease spills. What can I do in the bathroom? Use night lights. Install grab bars by the toilet and in the tub and shower. Do not use towel bars as grab bars. Use non-skid mats or decals in the tub or shower. If you need to sit down in the shower, use a plastic, non-slip stool. Keep the floor dry. Clean up any water that spills on the floor  as soon as it happens. Remove soap buildup in the tub or shower regularly. Attach bath mats securely with double-sided non-slip rug tape. Do not have throw rugs and other things on the floor that can make you trip. What can I do in the bedroom? Use night lights. Make sure that you have a light by your bed that is easy to reach. Do not use any sheets or blankets that are too big for your bed. They should not hang down onto the floor. Have a firm chair that has side arms. You can use this for support while you get dressed. Do not have throw rugs and other things on the floor that can make you trip. What can I do in the kitchen? Clean up any spills right away. Avoid walking on wet floors. Keep items that you use a lot in easy-to-reach places. If you need to reach something above you, use a strong step stool that has a grab bar. Keep electrical cords out of the way. Do not use floor polish or wax that makes floors slippery. If you must use wax, use non-skid floor wax. Do not have throw rugs and other things on the floor that can make you trip. What can I  do with my stairs? Do not leave any items on the stairs. Make sure that there are handrails on both sides of the stairs and use them. Fix handrails that are broken or loose. Make sure that handrails are as long as the stairways. Check any carpeting to make sure that it is firmly attached to the stairs. Fix any carpet that is loose or worn. Avoid having throw rugs at the top or bottom of the stairs. If you do have throw rugs, attach them to the floor with carpet tape. Make sure that you have a light switch at the top of the stairs and the bottom of the stairs. If you do not have them, ask someone to add them for you. What else can I do to help prevent falls? Wear shoes that: Do not have high heels. Have rubber bottoms. Are comfortable and fit you well. Are closed at the toe. Do not wear sandals. If you use a stepladder: Make sure that it is  fully opened. Do not climb a closed stepladder. Make sure that both sides of the stepladder are locked into place. Ask someone to hold it for you, if possible. Clearly mark and make sure that you can see: Any grab bars or handrails. First and last steps. Where the edge of each step is. Use tools that help you move around (mobility aids) if they are needed. These include: Canes. Walkers. Scooters. Crutches. Turn on the lights when you go into a dark area. Replace any light bulbs as soon as they burn out. Set up your furniture so you have a clear path. Avoid moving your furniture around. If any of your floors are uneven, fix them. If there are any pets around you, be aware of where they are. Review your medicines with your doctor. Some medicines can make you feel dizzy. This can increase your chance of falling. Ask your doctor what other things that you can do to help prevent falls. This information is not intended to replace advice given to you by your health care provider. Make sure you discuss any questions you have with your health care provider. Document Released: 05/31/2009 Document Revised: 01/10/2016 Document Reviewed: 09/08/2014 Elsevier Interactive Patient Education  2017 [employer] Managing Pain Without Opioids Opioids are strong medicines used to treat moderate to severe pain. For some people, especially those who have long-term (chronic) pain, opioids may not be the best choice for pain management due to: Side effects like nausea, constipation, and sleepiness. The risk of addiction (opioid use disorder). The longer you take opioids, the greater your risk of addiction. Pain that lasts for more than 3 months is called chronic pain. Managing chronic pain usually requires more than one approach and is often provided by a team of health care providers working together (multidisciplinary approach). Pain management may be done at a pain management center or pain clinic. How to manage  pain without the use of opioids Use non-opioid medicines Non-opioid medicines for pain may include: Over-the-counter or prescription non-steroidal anti-inflammatory drugs (NSAIDs). These may be the first medicines used for pain. They work well for muscle and bone pain, and they reduce swelling. Acetaminophen. This over-the-counter medicine may work well for milder pain but not swelling. Antidepressants. These may be used to treat chronic pain. A certain type of antidepressant (tricyclics) is often used. These medicines are given in lower doses for pain than when used for depression. Anticonvulsants. These are usually used to treat seizures but may also reduce nerve (neuropathic) pain.  Muscle relaxants. These relieve pain caused by sudden muscle tightening (spasms). You may also use a pain medicine that is applied to the skin as a patch, cream, or gel (topical analgesic), such as a numbing medicine. These may cause fewer side effects than medicines taken by mouth. Do certain therapies as directed Some therapies can help with pain management. They include: Physical therapy. You will do exercises to gain strength and flexibility. A physical therapist may teach you exercises to move and stretch parts of your body that are weak, stiff, or painful. You can learn these exercises at physical therapy visits and practice them at home. Physical therapy may also involve: Massage. Heat wraps or applying heat or cold to affected areas. Electrical signals that interrupt pain signals (transcutaneous electrical nerve stimulation, TENS). Weak lasers that reduce pain and swelling (low-level laser therapy). Signals from your body that help you learn to regulate pain (biofeedback). Occupational therapy. This helps you to learn ways to function at home and work with less pain. Recreational therapy. This involves trying new activities or hobbies, such as a physical activity or drawing. Mental health therapy,  including: Cognitive behavioral therapy (CBT). This helps you learn coping skills for dealing with pain. Acceptance and commitment therapy (ACT) to change the way you think and react to pain. Relaxation therapies, including muscle relaxation exercises and mindfulness-based stress reduction. Pain management counseling. This may be individual, family, or group counseling.  Receive medical treatments Medical treatments for pain management include: Nerve block injections. These may include a pain blocker and anti-inflammatory medicines. You may have injections: Near the spine to relieve chronic back or neck pain. Into joints to relieve back or joint pain. Into nerve areas that supply a painful area to relieve body pain. Into muscles (trigger point injections) to relieve some painful muscle conditions. A medical device placed near your spine to help block pain signals and relieve nerve pain or chronic back pain (spinal cord stimulation device). Acupuncture. Follow these instructions at home Medicines Take over-the-counter and prescription medicines only as told by your health care provider. If you are taking pain medicine, ask your health care providers about possible side effects to watch out for. Do not drive or use heavy machinery while taking prescription opioid pain medicine. Lifestyle  Do not use drugs or alcohol to reduce pain. If you drink alcohol, limit how much you have to: 0-1 drink a day for women who are not pregnant. 0-2 drinks a day for men. Know how much alcohol is in a drink. In the [place] one drink equals one 12 oz bottle of beer (355 mL), one 5 oz glass of wine (148 mL), or one 1 oz glass of hard liquor (44 mL). Do not use any products that contain nicotine or tobacco. These products include cigarettes, chewing tobacco, and vaping devices, such as e-cigarettes. If you need help quitting, ask your health care provider. Eat a healthy diet and maintain a healthy weight. Poor  diet and excess weight may make pain worse. Eat foods that are high in fiber. These include fresh fruits and vegetables, whole grains, and beans. Limit foods that are high in fat and processed sugars, such as fried and sweet foods. Exercise regularly. Exercise lowers stress and may help relieve pain. Ask your health care provider what activities and exercises are safe for you. If your health care provider approves, join an exercise class that combines movement and stress reduction. Examples include yoga and tai chi. Get enough sleep. Lack of sleep  may make pain worse. Lower stress as much as possible. Practice stress reduction techniques as told by your therapist. General instructions Work with all your pain management providers to find the treatments that work best for you. You are an important member of your pain management team. There are many things you can do to reduce pain on your own. Consider joining an online or in-person support group for people who have chronic pain. Keep all follow-up visits. This is important. Where to find more information You can find more information about managing pain without opioids from: American Academy of Pain Medicine: painmed.org Institute for Chronic Pain: instituteforchronicpain.org American Chronic Pain Association: theacpa.org Contact a health care provider if: You have side effects from pain medicine. Your pain gets worse or does not get better with treatments or home therapy. You are struggling with anxiety or depression. Summary Many types of pain can be managed without opioids. Chronic pain may respond better to pain management without opioids. Pain is best managed when you and a team of health care providers work together. Pain management without opioids may include non-opioid medicines, medical treatments, physical therapy, mental health therapy, and lifestyle changes. Tell your health care providers if your pain gets worse or is not being  managed well enough. This information is not intended to replace advice given to you by your health care provider. Make sure you discuss any questions you have with your health care provider. Document Revised: 11/14/2020 Document Reviewed: 11/14/2020 Elsevier Patient Education  2024 [employer].

## 2023-03-17 NOTE — Progress Notes (Signed)
Subjective:   Gary Levy is a 82 y.o. male who presents for Medicare Annual/Subsequent preventive examination.  Visit Complete: Virtual  I connected with  [name] on 03/17/23 by a audio enabled telemedicine application and verified that I am speaking with the correct person using two identifiers.  Patient Location: Home  Provider Location: Home Office  I discussed the limitations of evaluation and management by telemedicine. The patient expressed understanding and agreed to proceed.  Vital Signs: Per patient no change in vitals since last visit.   Review of Systems   Cardiac Risk Factors include: male gender;advanced age (>54men, >63 women);dyslipidemia;hypertension;Other (see comment), Risk factor comments: CAD, CKD     Objective:    Today's Vitals   03/17/23 0944 03/17/23 0945  Weight: 157 lb (71.2 kg)   Height: 5' 10.5" (1.791 m)   PainSc:  8    Body mass index is 22.21 kg/m.     03/17/2023    9:56 AM 02/20/2022    1:34 PM 06/27/2019    2:43 PM 10/01/2018    9:56 AM 04/22/2018    8:28 AM 12/31/2016    9:17 AM 10/14/2016   10:13 AM  Advanced Directives  Does Patient Have a Medical Advance Directive? Yes No Yes No No No Yes  Type of Estate agent of [place];Living will  Healthcare Power of [place];Living will    Living will  Copy of Healthcare Power of Attorney in Chart? No - copy requested  No - copy requested      Would patient like information on creating a medical advance directive?  No - Patient declined  No - Patient declined No - Patient declined No - Patient declined     Current Medications (verified) Outpatient Encounter Medications as of 03/17/2023  Medication Sig   Alcohol Swabs (B-D SINGLE USE SWABS REGULAR) PADS Use as directed twice daily E11.9   allopurinol (ZYLOPRIM) 100 MG tablet TAKE 1 TABLET EVERY DAY   aspirin 81 MG EC tablet Take 81 mg by mouth daily.   atorvastatin (LIPITOR) 80 MG tablet TAKE 1 TABLET EVERY DAY AT  6:00 PM   Blood Glucose Calibration (TRUE METRIX LEVEL 1) Low SOLN Use as directed once daily E11.9   blood glucose meter kit and supplies KIT Use daily to check blood sugar levels.   carvedilol (COREG) 12.5 MG tablet TAKE 1 TABLET TWICE DAILY WITH MEALS   colchicine 0.6 MG tablet Take 1 tablet (0.6 mg total) by mouth daily.   ezetimibe (ZETIA) 10 MG tablet TAKE 1 TABLET EVERY DAY   HYDROcodone-acetaminophen (NORCO/VICODIN) 5-325 MG tablet Take 1-2 tablets by mouth every 4 (four) hours as needed.   Multiple Vitamins-Minerals (CENTRUM PO) Take 1 tablet by mouth daily.   nitroGLYCERIN (NITROSTAT) 0.4 MG SL tablet Place 0.4 mg under the tongue every 5 (five) minutes as needed for chest pain (Call 911 at 3rd dose within 15 minutes).   solifenacin (VESICARE) 5 MG tablet Take 1 tablet (5 mg total) by mouth daily.   spironolactone (ALDACTONE) 25 MG tablet TAKE 1 TABLET EVERY DAY   traMADol (ULTRAM) 50 MG tablet Take 1 tablet (50 mg total) by mouth every 6 (six) hours as needed.   triamcinolone (NASACORT) 55 MCG/ACT AERO nasal inhaler Place 2 sprays into the nose daily.   cetirizine (ZYRTEC) 10 MG tablet Take 1 tablet (10 mg total) by mouth daily.   [DISCONTINUED] bismuth-metronidazole-tetracycline (PYLERA) 140-125-125 MG per capsule Take 3 capsules by mouth 4 (four) times daily -  before meals and at bedtime.   [DISCONTINUED] predniSONE (DELTASONE) 10 MG tablet 3 tabs by mouth per day for 3 days,2tabs per day for 3 days,1tab per day for 3 days   No facility-administered encounter medications on file as of 03/17/2023.    Allergies (verified) Patient has no known allergies.   History: Past Medical History:  Diagnosis Date   Alcohol abuse    hx   Anemia, iron deficiency    AR (allergic rhinitis)    BPH (benign prostatic hypertrophy)    CAD (coronary artery disease) of artery bypass graft    CHF (congestive heart failure) (HCC)    CKD (chronic kidney disease) 09/30/2011   COPD (chronic  obstructive pulmonary disease) (HCC)    Crohn's disease (HCC)    Dysuria    ED (erectile dysfunction) of organic origin    Fatigue    Glaucoma    Glucose intolerance (impaired glucose tolerance)    HLD (hyperlipidemia)    HTN (hypertension)    Impaired glucose tolerance 09/27/2011   MI (myocardial infarction) (HCC)    hx   Moderate or severe vision impairment, both eyes, impairment level not further specified    Postherpetic neuralgia    PSA elevation    Routine general medical examination at a health care facility    Shingles    Skin cancer    hx; non-melanoma   Special screening for malignant neoplasm of prostate    Urinary frequency    Past Surgical History:  Procedure Laterality Date   CORONARY ANGIOPLASTY WITH STENT PLACEMENT     CORONARY ARTERY BYPASS GRAFT     11+ years ago; has an EF of about 45%, last Myoview showed no ischemia 12/30/06   [facility]  2011   Dr. Rayburn Ma    right cataract Bilateral 07/2010   with lens implant    Family History  Problem Relation Age of Onset   Heart disease Mother    Social History   Socioeconomic History   Marital status: Married    Spouse name: Mary   Number of children: 6   Years of education: Not on file   Highest education level: Not on file  Occupational History   Occupation: Retired  Tobacco Use   Smoking status: Former    Current packs/day: 0.00    Types: Cigarettes    Quit date: 03/20/1981    Years since quitting: 42.0   Smokeless tobacco: Never  Vaping Use   Vaping status: Never Used  Substance and Sexual Activity   Alcohol use: No   Drug use: No   Sexual activity: Yes  Other Topics Concern   Not on file  Social History Narrative   Married, 4 children; retired - Arts development officer.      Designated party release on file. [employer]. 01/15/10.    Social Determinants of Health   Financial Resource Strain: Low Risk  (03/17/2023)   Overall Financial Resource Strain (CARDIA)    Difficulty of Paying Living Expenses:  Not hard at all  Food Insecurity: No Food Insecurity (03/17/2023)   Hunger Vital Sign    Worried About Running Out of Food in the Last Year: Never true    Ran Out of Food in the Last Year: Never true  Transportation Needs: No Transportation Needs (03/17/2023)   PRAPARE - Administrator, Civil Service (Medical): No    Lack of Transportation (Non-Medical): No  Physical Activity: Sufficiently Active (03/17/2023)   Exercise Vital Sign    Days  of Exercise per Week: 3 days    Minutes of Exercise per Session: 60 min  Stress: No Stress Concern Present (03/17/2023)   [employer] of Occupational Health - Occupational Stress Questionnaire    Feeling of Stress : Not at all  Social Connections: Moderately Integrated (03/17/2023)   Social Connection and Isolation Panel [NHANES]    Frequency of Communication with Friends and Family: Three times a week    Frequency of Social Gatherings with Friends and Family: More than three times a week    Attends Religious Services: Never    Database administrator or Organizations: Yes    Attends Engineer, structural: 1 to 4 times per year    Marital Status: Married    Tobacco Counseling Counseling given: Not Answered   Clinical Intake:  Pre-visit preparation completed: Yes  Pain : 0-10 Pain Score: 8  Pain Type: Acute pain Pain Location: Shoulder (Rt shoulder) Pain Descriptors / Indicators: Aching Pain Onset: 1 to 4 weeks ago Pain Frequency: Intermittent Pain Relieving Factors: Topical rub Effect of Pain on Daily Activities: Whenever he lays down  Pain Relieving Factors: Topical rub  BMI - recorded: 22.21 Nutritional Status: BMI of 19-24  Normal Nutritional Risks: None Diabetes: No  How often do you need to have someone help you when you read instructions, pamphlets, or other written materials from your doctor or pharmacy?: 1 - Never  Interpreter Needed?: No  Information entered by :: Adeana Grilliot, RMA   Activities  of Daily Living    03/17/2023    9:48 AM  In your present state of health, do you have any difficulty performing the following activities:  Hearing? 0  Vision? 0  Difficulty concentrating or making decisions? 0  Walking or climbing stairs? 0  Dressing or bathing? 0  Doing errands, shopping? 0  Preparing Food and eating ? N  Using the Toilet? N  In the past six months, have you accidently leaked urine? N  Do you have problems with loss of bowel control? N  Managing your Medications? N  Managing your Finances? N  Housekeeping or managing your Housekeeping? N    Patient Care Team: Corwin Levins, MD as PCP - General Clifton James Nile Dear, MD as PCP - Cardiology (Cardiology) Nelson Chimes, MD as Consulting Physician (Ophthalmology) Shon Millet, MD as Consulting Physician (Ophthalmology)  Indicate any recent Medical Services you may have received from other than [facility] providers in the past year (date may be approximate).     Assessment:   This is a routine wellness examination for [place].  Hearing/Vision screen Hearing Screening - Comments:: Denies hearing difficulties   Vision Screening - Comments:: Wears eyeglasses  Dietary issues and exercise activities discussed:     Goals Addressed               This Visit's Progress     Patient Stated (pt-stated)        Continue to be as active as possible. Enjoy life and family. Same Goal-03/17/23-BT      Depression Screen    03/17/2023   10:03 AM 11/11/2022    8:30 AM 10/28/2022   11:12 AM 04/29/2022   10:54 AM 02/20/2022    1:36 PM 10/28/2021    1:03 PM 04/29/2021    9:41 AM  PHQ 2/9 Scores  PHQ - 2 Score 0 0 0  0 0 0  PHQ- 9 Score 0 0       Exception Documentation  Patient refusal       Fall Risk    03/17/2023    9:56 AM 11/11/2022    8:31 AM 10/28/2022   11:12 AM 04/29/2022   10:54 AM 02/20/2022    1:35 PM  Fall Risk   Falls in the past year? 0 0 0 0 0  Number falls in past yr: 0 0 0  0  Injury with Fall? 0  0 0 0 0  Risk for fall due to : No Fall Risks No Fall Risks  No Fall Risks No Fall Risks  Follow up Falls prevention discussed Falls evaluation completed;Education provided  Falls evaluation completed Falls evaluation completed    MEDICARE RISK AT HOME:  Medicare Risk at Home - 03/17/23 0957     Any stairs in or around the home? Yes    If so, are there any without handrails? Yes    Home free of loose throw rugs in walkways, pet beds, electrical cords, etc? Yes    Adequate lighting in your home to reduce risk of falls? Yes    Life alert? No   per patient- has a bracelett   Use of a cane, walker or w/c? No    Grab bars in the bathroom? Yes    Shower chair or bench in shower? No    Elevated toilet seat or a handicapped toilet? Yes             TIMED UP AND GO:  Was the test performed?  No    Cognitive Function:    04/22/2018    8:31 AM  MMSE - Mini Mental State Exam  Orientation to time 5  Orientation to Place 5  Registration 3  Attention/ Calculation 0  Attention/Calculation-comments states he cannot do this  Recall 2  Language- name 2 objects 2  Language- repeat 1  Language- follow 3 step command 3  Language- read & follow direction 1  Write a sentence 1  Copy design 1  Total score 24        03/17/2023    9:58 AM 02/20/2022    1:41 PM  6CIT Screen  What Year? 0 points 0 points  What month? 0 points 0 points  What time? 0 points 0 points  Count back from 20 0 points 0 points  Months in reverse  0 points  Repeat phrase  0 points  Total Score  0 points    Immunizations Immunization History  Administered Date(s) Administered   Fluad Quad(high Dose 65+) 04/15/2019, 06/02/2020, 04/29/2021, 04/29/2022   Influenza Split 05/05/2012   Influenza Whole 08/15/2009, 06/11/2010   Influenza, High Dose Seasonal PF 06/07/2013, 04/12/2015, 07/01/2016, 04/14/2017, 04/16/2018   Influenza,inj,Quad PF,6+ Mos 04/11/2014   PFIZER(Purple Top)SARS-COV-2 Vaccination 09/23/2019,  10/14/2019, 05/28/2020   Pneumococcal Conjugate-13 10/12/2013   Pneumococcal Polysaccharide-23 08/19/1995, 08/15/2009   Td 08/19/1995, 08/15/2009    TDAP status: Due, Education has been provided regarding the importance of this vaccine. Advised may receive this vaccine at local pharmacy or Health Dept. Aware to provide a copy of the vaccination record if obtained from local pharmacy or Health Dept. Verbalized acceptance and understanding.  Flu Vaccine status: Up to date  Pneumococcal vaccine status: Up to date  Covid-19 vaccine status: Completed vaccines  Qualifies for Shingles Vaccine? Yes   Zostavax completed No   Shingrix Completed?: No.    Education has been provided regarding the importance of this vaccine. Patient has been advised to call insurance company to determine out of pocket expense  if they have not yet received this vaccine. Advised may also receive vaccine at local pharmacy or Health Dept. Verbalized acceptance and understanding.  Screening Tests Health Maintenance  Topic Date Due   Zoster Vaccines- Shingrix (1 of 2) Never done   DTaP/Tdap/Td (3 - Tdap) 08/16/2019   INFLUENZA VACCINE  03/19/2023   Medicare Annual Wellness (AWV)  03/16/2024   Pneumonia Vaccine 92+ Years old  Completed   HPV VACCINES  Aged Out   COVID-19 Vaccine  Discontinued    Health Maintenance  Health Maintenance Due  Topic Date Due   Zoster Vaccines- Shingrix (1 of 2) Never done   DTaP/Tdap/Td (3 - Tdap) 08/16/2019    Colorectal cancer screening: No longer required.   Lung Cancer Screening: (Low Dose CT Chest recommended if Age 88-80 years, 20 pack-year currently smoking OR have quit w/in 15years.) does not qualify.   Lung Cancer Screening Referral: N/A  Additional Screening:  Hepatitis C Screening: does not qualify;   Vision Screening: Recommended annual ophthalmology exams for early detection of glaucoma and other disorders of the eye. Is the patient up to date with their annual  eye exam?  Yes  Who is the provider or what is the name of the office in which the patient attends annual eye exams? Dr. Hazle Quant If pt is not established with a provider, would they like to be referred to a provider to establish care? No .   Dental Screening: Recommended annual dental exams for proper oral hygiene  Community Resource Referral / Chronic Care Management: CRR required this visit?  No   CCM required this visit?  No     Plan:     I have personally reviewed and noted the following in the patient's chart:   Medical and social history Use of alcohol, tobacco or illicit drugs  Current medications and supplements including opioid prescriptions. Patient is currently taking opioid prescriptions. Information provided to patient regarding non-opioid alternatives. Patient advised to discuss non-opioid treatment plan with their provider. Functional ability and status Nutritional status Physical activity Advanced directives List of other physicians Hospitalizations, surgeries, and ER visits in previous 12 months Vitals Screenings to include cognitive, depression, and falls Referrals and appointments  In addition, I have reviewed and discussed with patient certain preventive protocols, quality metrics, and best practice recommendations. A written personalized care plan for preventive services as well as general preventive health recommendations were provided to patient.     Gioia Ranes L Georgenia Salim, CMA   03/17/2023   After Visit Summary: (Mail) Due to this being a telephonic visit, the after visit summary with patients personalized plan was offered to patient via mail   Nurse Notes: Patient complained about aching pain in his Rt shoulder starting 4 weeks ago, at a pain level of 6-8. He would like for this to be addressed during his 6 month follow up with PCP.  Patient is due for a Tdap and for Shingles Vaccine, which he would like to get it during his next office with Dr. Jonny Ruiz.

## 2023-04-01 ENCOUNTER — Other Ambulatory Visit: Payer: Self-pay | Admitting: Cardiovascular Disease

## 2023-04-02 ENCOUNTER — Encounter (INDEPENDENT_AMBULATORY_CARE_PROVIDER_SITE_OTHER): Payer: Self-pay

## 2023-04-12 NOTE — Progress Notes (Unsigned)
Cardiology Office Note:  .   Date:  04/13/2023  ID:  Gary Levy, DOB 13-Jan-1941, MRN [id number] PCP: Gary Levins, MD  [facility] Providers Cardiologist:  Verne Carrow, MD {  History of Present Illness: .   Gary Levy is a 82 y.o. male with a past medical history of CAD status post four-vessel CABG in 1995, ischemic cardiomyopathy, HTN, HLD, COPD, CKD here for follow-up visit.  History includes CAD dating back to the 1990s with prior PCI and CABG.  Last cardiac cath was scanned into epic from 1999 which shows failure of the LIMA graft to LAD and SVG to RCA.  There was high-grade disease in the SVG to the OM and the SVG to the diagonal. It appeared that the LAD was treated with Rotablator arthrectomy and stented in 1999.  The SVG to the OM and the SVG to the diagonal were treated with angioplasty.   Patient was seen by Dr. Clifton James 07/28/2013 and had complained of exertional chest pain, resolved with rest.  Stress Myoview 08/17/2013 showed intermediate risk with a fixed large severe inferior lateral, basal to mid inferior and mid to apical anterior perfusion defect.  This suggested prior infarction with no significant ischemia.  Echo April 2019 with LVEF 40 to 45%, mild MR, echo June 2022 with LVEF 45%, trivial MR.  He was last seen 02/21/2022 and at that time he denied any chest pain, palpitations, lower extremity edema, orthopnea, PND, dizziness, syncope or or near syncope.  He has chronic dyspnea with moderate exertion.  No change over the past few years.  Today, he tells me he is doing pretty well.  He has not had any issues with his heart that he knows of.  He had an echocardiogram back in 2022 which we reviewed today.  Does have some shortness of breath with heavy exertion but still is able to do a decent amount of walking.  He feels like he can do everything he wants to do.   Reports no shortness of breath nor dyspnea on exertion. Reports no chest pain, pressure, or  tightness. No edema, orthopnea, PND. Reports no palpitations.    ROS: Pertinent ROS in HPI  Studies Reviewed: .        Echo June 2022:  1. Left ventricular ejection fraction, by estimation, is 45%. The left  ventricle has mildly decreased function. The left ventricle demonstrates  regional wall motion abnormalities (distal LAD territory). Left  ventricular diastolic parameters are  consistent with Grade II diastolic dysfunction (pseudonormalization).  Elevated left atrial pressure.   2. Right ventricular systolic function is normal. The right ventricular  size is normal. Mildly increased right ventricular wall thickness. There  is mildly elevated pulmonary artery systolic pressure. The estimated right  ventricular systolic pressure is  41.7 mmHg.   3. The mitral valve is grossly normal. Trivial mitral valve  regurgitation.   4. The aortic valve is tricuspid. There is moderate calcification of the  aortic valve (predominantely non-cusp). Aortic valve regurgitation is not  visualized. No aortic stenosis is present.   5. The inferior vena cava is normal in size with greater than 50%  respiratory variability, suggesting right atrial pressure of 3 mmHg.        Physical Exam:   VS:  BP 122/60   Pulse 62   Ht 5' 10.5" (1.791 m)   Wt 151 lb 6.4 oz (68.7 kg)   SpO2 98%   BMI 21.42 kg/m    Wt Readings from  Last 3 Encounters:  04/13/23 151 lb 6.4 oz (68.7 kg)  03/17/23 157 lb (71.2 kg)  11/11/22 157 lb (71.2 kg)    GEN: Well nourished, well developed in no acute distress NECK: No JVD; No carotid bruits CARDIAC: RRR with occasional skipped beat, no murmurs, rubs, gallops RESPIRATORY:  Clear to auscultation without rales, wheezing or rhonchi  ABDOMEN: Soft, non-tender, non-distended EXTREMITIES:  No edema; No deformity   ASSESSMENT AND PLAN: .   1.  CAD status post CABG without angina -No chest pain -Shortness of breath has been stable -Would recommend continuing current  medication regimen which includes aspirin 81 mg daily, Lipitor 80 mg daily, carvedilol 12.5 mg twice a day, Zetia 10 mg daily, nitro as needed (he has not needed it), spironolactone 25 mg daily  2.  HTN -Blood pressure well-controlled today 122/60 -Continue current medication regimen -Continue low-sodium heart healthy diet  3.  HLD -LDL was 55 -sees PCP next month and will have repeat lab work done  4.  Ischemic cardiomyopathy -seems like he doing all he wants to do at this point -Euvolemic on exam -Does occasionally have some lower extremity edema due to saphenous vein harvest back and identified for his CABG surgery, this is usually transient  5.  Chronic systolic CHF -Last echocardiogram reviewed with LVEF 45%, stable from previous echo -Since patient feels well today, would not repeat      Dispo: He can follow-up in a year with Dr. Clifton James  Signed, Sharlene Dory, PA-C

## 2023-04-13 ENCOUNTER — Ambulatory Visit: Payer: Medicare HMO | Attending: Physician Assistant | Admitting: Physician Assistant

## 2023-04-13 ENCOUNTER — Encounter: Payer: Self-pay | Admitting: Physician Assistant

## 2023-04-13 VITALS — BP 122/60 | HR 62 | Ht 70.5 in | Wt 151.4 lb

## 2023-04-13 DIAGNOSIS — J449 Chronic obstructive pulmonary disease, unspecified: Secondary | ICD-10-CM | POA: Diagnosis not present

## 2023-04-13 DIAGNOSIS — I5022 Chronic systolic (congestive) heart failure: Secondary | ICD-10-CM | POA: Diagnosis not present

## 2023-04-13 DIAGNOSIS — I2510 Atherosclerotic heart disease of native coronary artery without angina pectoris: Secondary | ICD-10-CM | POA: Diagnosis not present

## 2023-04-13 DIAGNOSIS — I255 Ischemic cardiomyopathy: Secondary | ICD-10-CM | POA: Diagnosis not present

## 2023-04-13 DIAGNOSIS — K5090 Crohn's disease, unspecified, without complications: Secondary | ICD-10-CM | POA: Diagnosis not present

## 2023-04-13 DIAGNOSIS — I5020 Unspecified systolic (congestive) heart failure: Secondary | ICD-10-CM | POA: Diagnosis not present

## 2023-04-13 DIAGNOSIS — E785 Hyperlipidemia, unspecified: Secondary | ICD-10-CM | POA: Diagnosis not present

## 2023-04-13 DIAGNOSIS — I10 Essential (primary) hypertension: Secondary | ICD-10-CM

## 2023-04-13 NOTE — Patient Instructions (Signed)
Medication Instructions:  Your physician recommends that you continue on your current medications as directed. Please refer to the Current Medication list given to you today.  *If you need a refill on your cardiac medications before your next appointment, please call your pharmacy*  Lab Work: Have your primary care provider draw labs when you see them in the spring If you have labs (blood work) drawn today and your tests are completely normal, you will receive your results only by: MyChart Message (if you have MyChart) OR A paper copy in the mail If you have any lab test that is abnormal or we need to change your treatment, we will call you to review the results.  Follow-Up: At [facility], you and your health needs are our priority.  As part of our continuing mission to provide you with exceptional heart care, we have created designated Provider Care Teams.  These Care Teams include your primary Cardiologist (physician) and Advanced Practice Providers (APPs -  Physician Assistants and Nurse Practitioners) who all work together to provide you with the care you need, when you need it.  Your next appointment:   1 year(s)  Provider:   Verne Carrow, MD     Low-Sodium Eating Plan Salt (sodium) helps you keep a healthy balance of fluids in your body. Too much sodium can raise your blood pressure. It can also cause fluid and waste to be held in your body. Your health care provider or dietitian may recommend a low-sodium eating plan if you have high blood pressure (hypertension), kidney disease, liver disease, or heart failure. Eating less sodium can help lower your blood pressure and reduce swelling. It can also protect your heart, liver, and kidneys. What are tips for following this plan? Reading food labels  Check food labels for the amount of sodium per serving. If you eat more than one serving, you must multiply the listed amount by the number of servings. Choose foods with  less than 140 milligrams (mg) of sodium per serving. Avoid foods with 300 mg of sodium or more per serving. Always check how much sodium is in a product, even if the label says "unsalted" or "no salt added." Shopping  Buy products labeled as "low-sodium" or "no salt added." Buy fresh foods. Avoid canned foods and pre-made or frozen meals. Avoid canned, cured, or processed meats. Buy breads that have less than 80 mg of sodium per slice. Cooking  Eat more home-cooked food. Try to eat less restaurant, buffet, and fast food. Try not to add salt when you cook. Use salt-free seasonings or herbs instead of table salt or sea salt. Check with your provider or pharmacist before using salt substitutes. Cook with plant-based oils, such as canola, sunflower, or olive oil. Meal planning When eating at a restaurant, ask if your food can be made with less salt or no salt. Avoid dishes labeled as brined, pickled, cured, or smoked. Avoid dishes made with soy sauce, miso, or teriyaki sauce. Avoid foods that have monosodium glutamate (MSG) in them. MSG may be added to some restaurant food, sauces, soups, bouillon, and canned foods. Make meals that can be grilled, baked, poached, roasted, or steamed. These are often made with less sodium. General information Try to limit your sodium intake to 1,500-2,300 mg each day, or the amount told by your provider. What foods should I eat? Fruits Fresh, frozen, or canned fruit. Fruit juice. Vegetables Fresh or frozen vegetables. "No salt added" canned vegetables. "No salt added" tomato sauce  and paste. Low-sodium or reduced-sodium tomato and vegetable juice. Grains Low-sodium cereals, such as oats, puffed wheat and rice, and shredded wheat. Low-sodium crackers. Unsalted rice. Unsalted pasta. Low-sodium bread. Whole grain breads and whole grain pasta. Meats and other proteins Fresh or frozen meat, poultry, seafood, and fish. These should have no added salt. Low-sodium  canned tuna and salmon. Unsalted nuts. Dried peas, beans, and lentils without added salt. Unsalted canned beans. Eggs. Unsalted nut butters. Dairy Milk. Soy milk. Cheese that is naturally low in sodium, such as ricotta cheese, fresh mozzarella, or Swiss cheese. Low-sodium or reduced-sodium cheese. Cream cheese. Yogurt. Seasonings and condiments Fresh and dried herbs and spices. Salt-free seasonings. Low-sodium mustard and ketchup. Sodium-free salad dressing. Sodium-free light mayonnaise. Fresh or refrigerated horseradish. Lemon juice. Vinegar. Other foods Homemade, reduced-sodium, or low-sodium soups. Unsalted popcorn and pretzels. Low-salt or salt-free chips. The items listed above may not be all the foods and drinks you can have. Talk to a dietitian to learn more. What foods should I avoid? Vegetables Sauerkraut, pickled vegetables, and relishes. Olives. [place] fries. Onion rings. Regular canned vegetables, except low-sodium or reduced-sodium items. Regular canned tomato sauce and paste. Regular tomato and vegetable juice. Frozen vegetables in sauces. Grains Instant hot cereals. Bread stuffing, pancake, and biscuit mixes. Croutons. Seasoned rice or pasta mixes. Noodle soup cups. Boxed or frozen macaroni and cheese. Regular salted crackers. Self-rising flour. Meats and other proteins Meat or fish that is salted, canned, smoked, spiced, or pickled. Precooked or cured meat, such as sausages or meat loaves. Tomasa Blase. Ham. Pepperoni. Hot dogs. Corned beef. Chipped beef. Salt pork. Jerky. Pickled herring, anchovies, and sardines. Regular canned tuna. Salted nuts. Dairy Processed cheese and cheese spreads. Hard cheeses. Cheese curds. Blue cheese. Feta cheese. String cheese. Regular cottage cheese. Buttermilk. Canned milk. Fats and oils Salted butter. Regular margarine. Ghee. Bacon fat. Seasonings and condiments Onion salt, garlic salt, seasoned salt, table salt, and sea salt. Canned and packaged gravies.  Worcestershire sauce. Tartar sauce. Barbecue sauce. Teriyaki sauce. Soy sauce, including reduced-sodium soy sauce. Steak sauce. Fish sauce. Oyster sauce. Cocktail sauce. Horseradish that you find on the shelf. Regular ketchup and mustard. Meat flavorings and tenderizers. Bouillon cubes. Hot sauce. Pre-made or packaged marinades. Pre-made or packaged taco seasonings. Relishes. Regular salad dressings. Salsa. Other foods Salted popcorn and pretzels. Corn chips and puffs. Potato and tortilla chips. Canned or dried soups. Pizza. Frozen entrees and pot pies. The items listed above may not be all the foods and drinks you should avoid. Talk to a dietitian to learn more. This information is not intended to replace advice given to you by your health care provider. Make sure you discuss any questions you have with your health care provider. Document Revised: 08/21/2022 Document Reviewed: 08/21/2022 Elsevier Patient Education  2024 [employer] Heart-Healthy Eating Plan Many factors influence your heart health, including eating and exercise habits. Heart health is also called coronary health. Coronary risk increases with abnormal blood fat (lipid) levels. A heart-healthy eating plan includes limiting unhealthy fats, increasing healthy fats, limiting salt (sodium) intake, and making other diet and lifestyle changes. What is my plan? Your health care provider may recommend that: You limit your fat intake to _________% or less of your total calories each day. You limit your saturated fat intake to _________% or less of your total calories each day. You limit the amount of cholesterol in your diet to less than _________ mg per day. You limit the amount of sodium in your diet to  less than _________ mg per day. What are tips for following this plan? Cooking Cook foods using methods other than frying. Baking, boiling, grilling, and broiling are all good options. Other ways to reduce fat include: Removing the skin  from poultry. Removing all visible fats from meats. Steaming vegetables in water or broth. Meal planning  At meals, imagine dividing your plate into fourths: Fill one-half of your plate with vegetables and green salads. Fill one-fourth of your plate with whole grains. Fill one-fourth of your plate with lean protein foods. Eat 2-4 cups of vegetables per day. One cup of vegetables equals 1 cup (91 g) broccoli or cauliflower florets, 2 medium carrots, 1 large bell pepper, 1 large sweet potato, 1 large tomato, 1 medium white potato, 2 cups (150 g) raw leafy greens. Eat 1-2 cups of fruit per day. One cup of fruit equals 1 small apple, 1 large banana, 1 cup (237 g) mixed fruit, 1 large orange,  cup (82 g) dried fruit, 1 cup (240 mL) 100% fruit juice. Eat more foods that contain soluble fiber. Examples include apples, broccoli, carrots, beans, peas, and barley. Aim to get 25-30 g of fiber per day. Increase your consumption of legumes, nuts, and seeds to 4-5 servings per week. One serving of dried beans or legumes equals  cup (90 g) cooked, 1 serving of nuts is  oz (12 almonds, 24 pistachios, or 7 walnut halves), and 1 serving of seeds equals  oz (8 g). Fats Choose healthy fats more often. Choose monounsaturated and polyunsaturated fats, such as olive and canola oils, avocado oil, flaxseeds, walnuts, almonds, and seeds. Eat more omega-3 fats. Choose salmon, mackerel, sardines, tuna, flaxseed oil, and ground flaxseeds. Aim to eat fish at least 2 times each week. Check food labels carefully to identify foods with trans fats or high amounts of saturated fat. Limit saturated fats. These are found in animal products, such as meats, butter, and cream. Plant sources of saturated fats include palm oil, palm kernel oil, and coconut oil. Avoid foods with partially hydrogenated oils in them. These contain trans fats. Examples are stick margarine, some tub margarines, cookies, crackers, and other baked  goods. Avoid fried foods. General information Eat more home-cooked food and less restaurant, buffet, and fast food. Limit or avoid alcohol. Limit foods that are high in added sugar and simple starches such as foods made using white refined flour (white breads, pastries, sweets). Lose weight if you are overweight. Losing just 5-10% of your body weight can help your overall health and prevent diseases such as diabetes and heart disease. Monitor your sodium intake, especially if you have high blood pressure. Talk with your health care provider about your sodium intake. Try to incorporate more vegetarian meals weekly. What foods should I eat? Fruits All fresh, canned (in natural juice), or frozen fruits. Vegetables Fresh or frozen vegetables (raw, steamed, roasted, or grilled). Green salads. Grains Most grains. Choose whole wheat and whole grains most of the time. Rice and pasta, including [place] rice and pastas made with whole wheat. Meats and other proteins Lean, well-trimmed beef, veal, pork, and lamb. Chicken and [place] without skin. All fish and shellfish. Wild duck, rabbit, pheasant, and venison. Egg whites or low-cholesterol egg substitutes. Dried beans, peas, lentils, and tofu. Seeds and most nuts. Dairy Low-fat or nonfat cheeses, including ricotta and mozzarella. Skim or 1% milk (liquid, powdered, or evaporated). Buttermilk made with low-fat milk. Nonfat or low-fat yogurt. Fats and oils Non-hydrogenated (trans-free) margarines. Vegetable oils, including soybean,  sesame, sunflower, olive, avocado, peanut, safflower, corn, canola, and cottonseed. Salad dressings or mayonnaise made with a vegetable oil. Beverages Water (mineral or sparkling). Coffee and tea. Unsweetened ice tea. Diet beverages. Sweets and desserts Sherbet, gelatin, and fruit ice. Small amounts of dark chocolate. Limit all sweets and desserts. Seasonings and condiments All seasonings and condiments. The items listed  above may not be a complete list of foods and beverages you can eat. Contact a dietitian for more options. What foods should I avoid? Fruits Canned fruit in heavy syrup. Fruit in cream or butter sauce. Fried fruit. Limit coconut. Vegetables Vegetables cooked in cheese, cream, or butter sauce. Fried vegetables. Grains Breads made with saturated or trans fats, oils, or whole milk. Croissants. Sweet rolls. Donuts. High-fat crackers, such as cheese crackers and chips. Meats and other proteins Fatty meats, such as hot dogs, ribs, sausage, bacon, rib-eye roast or steak. High-fat deli meats, such as salami and bologna. Caviar. Domestic duck and goose. Organ meats, such as liver. Dairy Cream, sour cream, cream cheese, and creamed cottage cheese. Whole-milk cheeses. Whole or 2% milk (liquid, evaporated, or condensed). Whole buttermilk. Cream sauce or high-fat cheese sauce. Whole-milk yogurt. Fats and oils Meat fat, or shortening. Cocoa butter, hydrogenated oils, palm oil, coconut oil, palm kernel oil. Solid fats and shortenings, including bacon fat, salt pork, lard, and butter. Nondairy cream substitutes. Salad dressings with cheese or sour cream. Beverages Regular sodas and any drinks with added sugar. Sweets and desserts Frosting. Pudding. Cookies. Cakes. Pies. Milk chocolate or white chocolate. Buttered syrups. Full-fat ice cream or ice cream drinks. The items listed above may not be a complete list of foods and beverages to avoid. Contact a dietitian for more information. Summary Heart-healthy meal planning includes limiting unhealthy fats, increasing healthy fats, limiting salt (sodium) intake and making other diet and lifestyle changes. Lose weight if you are overweight. Losing just 5-10% of your body weight can help your overall health and prevent diseases such as diabetes and heart disease. Focus on eating a balance of foods, including fruits and vegetables, low-fat or nonfat dairy, lean  protein, nuts and legumes, whole grains, and heart-healthy oils and fats. This information is not intended to replace advice given to you by your health care provider. Make sure you discuss any questions you have with your health care provider. Document Revised: 09/09/2021 Document Reviewed: 09/09/2021 Elsevier Patient Education  2024 [employer].

## 2023-04-29 ENCOUNTER — Other Ambulatory Visit: Payer: Self-pay | Admitting: Internal Medicine

## 2023-04-29 ENCOUNTER — Other Ambulatory Visit: Payer: Self-pay

## 2023-05-14 ENCOUNTER — Ambulatory Visit (INDEPENDENT_AMBULATORY_CARE_PROVIDER_SITE_OTHER): Payer: Medicare HMO | Admitting: Internal Medicine

## 2023-05-14 ENCOUNTER — Encounter: Payer: Self-pay | Admitting: Internal Medicine

## 2023-05-14 ENCOUNTER — Other Ambulatory Visit: Payer: Self-pay | Admitting: Internal Medicine

## 2023-05-14 VITALS — BP 124/72 | HR 49 | Temp 97.9°F | Ht 70.5 in | Wt 151.0 lb

## 2023-05-14 DIAGNOSIS — R7302 Impaired glucose tolerance (oral): Secondary | ICD-10-CM

## 2023-05-14 DIAGNOSIS — G8929 Other chronic pain: Secondary | ICD-10-CM

## 2023-05-14 DIAGNOSIS — Z23 Encounter for immunization: Secondary | ICD-10-CM

## 2023-05-14 DIAGNOSIS — Z0000 Encounter for general adult medical examination without abnormal findings: Secondary | ICD-10-CM

## 2023-05-14 DIAGNOSIS — M25511 Pain in right shoulder: Secondary | ICD-10-CM

## 2023-05-14 DIAGNOSIS — I10 Essential (primary) hypertension: Secondary | ICD-10-CM | POA: Diagnosis not present

## 2023-05-14 DIAGNOSIS — E7800 Pure hypercholesterolemia, unspecified: Secondary | ICD-10-CM | POA: Diagnosis not present

## 2023-05-14 DIAGNOSIS — N1832 Chronic kidney disease, stage 3b: Secondary | ICD-10-CM | POA: Diagnosis not present

## 2023-05-14 DIAGNOSIS — Z0001 Encounter for general adult medical examination with abnormal findings: Secondary | ICD-10-CM

## 2023-05-14 DIAGNOSIS — E559 Vitamin D deficiency, unspecified: Secondary | ICD-10-CM

## 2023-05-14 LAB — HEPATIC FUNCTION PANEL
ALT: 25 U/L (ref 0–53)
AST: 27 U/L (ref 0–37)
Albumin: 4.1 g/dL (ref 3.5–5.2)
Alkaline Phosphatase: 60 U/L (ref 39–117)
Bilirubin, Direct: 0.2 mg/dL (ref 0.0–0.3)
Total Bilirubin: 1.1 mg/dL (ref 0.2–1.2)
Total Protein: 7.7 g/dL (ref 6.0–8.3)

## 2023-05-14 LAB — CBC WITH DIFFERENTIAL/PLATELET
Basophils Absolute: 0 10*3/uL (ref 0.0–0.1)
Basophils Relative: 0.6 % (ref 0.0–3.0)
Eosinophils Absolute: 0.2 10*3/uL (ref 0.0–0.7)
Eosinophils Relative: 4.4 % (ref 0.0–5.0)
HCT: 38.5 % — ABNORMAL LOW (ref 39.0–52.0)
Hemoglobin: 12.4 g/dL — ABNORMAL LOW (ref 13.0–17.0)
Lymphocytes Relative: 24.9 % (ref 12.0–46.0)
Lymphs Abs: 1 10*3/uL (ref 0.7–4.0)
MCHC: 32.1 g/dL (ref 30.0–36.0)
MCV: 88.8 fl (ref 78.0–100.0)
Monocytes Absolute: 0.6 10*3/uL (ref 0.1–1.0)
Monocytes Relative: 13.8 % — ABNORMAL HIGH (ref 3.0–12.0)
Neutro Abs: 2.4 10*3/uL (ref 1.4–7.7)
Neutrophils Relative %: 56.3 % (ref 43.0–77.0)
Platelets: 178 10*3/uL (ref 150.0–400.0)
RBC: 4.33 Mil/uL (ref 4.22–5.81)
RDW: 15.6 % — ABNORMAL HIGH (ref 11.5–15.5)
WBC: 4.2 10*3/uL (ref 4.0–10.5)

## 2023-05-14 LAB — LIPID PANEL
Cholesterol: 111 mg/dL (ref 0–200)
HDL: 46.8 mg/dL (ref >39.00)
LDL Cholesterol: 55 mg/dL (ref 0–99)
NonHDL: 64.63
Total CHOL/HDL Ratio: 2
Triglycerides: 47 mg/dL (ref 0.0–149.0)
VLDL: 9.4 mg/dL (ref 0.0–40.0)

## 2023-05-14 LAB — BASIC METABOLIC PANEL
BUN: 21 mg/dL (ref 6–23)
CO2: 28 mEq/L (ref 19–32)
Calcium: 9.9 mg/dL (ref 8.4–10.5)
Chloride: 107 mEq/L (ref 96–112)
Creatinine, Ser: 1.91 mg/dL — ABNORMAL HIGH (ref 0.40–1.50)
GFR: 32.22 mL/min — ABNORMAL LOW (ref >60.00)
Glucose, Bld: 101 mg/dL — ABNORMAL HIGH (ref 70–99)
Potassium: 4.9 mEq/L (ref 3.5–5.1)
Sodium: 140 mEq/L (ref 135–145)

## 2023-05-14 LAB — VITAMIN D 25 HYDROXY (VIT D DEFICIENCY, FRACTURES): VITD: 22.35 ng/mL — ABNORMAL LOW (ref 30.00–100.00)

## 2023-05-14 NOTE — Progress Notes (Signed)
Patient ID: Gary Levy, male   DOB: 1941-07-05, 82 y.o.   MRN: [id number]         Chief Complaint:: wellness exam and right shoulder pain, ckd3a, hld, hyperlipidemia, low vit d, htn       HPI:  Gary Levy is a 82 y.o. male here for wellness exam; for tdap and shingrix at pharmacy, o/w up to date                        Also with c/o gradually worsening 5-6 mo right shoudler pain, aching type, with limit to active ROM to 90 degrees ony, has to use the left arm to support the right arm up to get the shirt on, took over 30 min today. Pt denies chest pain, increased sob or doe, wheezing, orthopnea, PND, increased LE swelling, palpitations, dizziness or syncope.   Pt denies polydipsia, polyuria, or new focal neuro s/s.    Pt denies fever, wt loss, night sweats, loss of appetite, or other constitutional symptoms     Wt Readings from Last 3 Encounters:  05/14/23 151 lb (68.5 kg)  04/13/23 151 lb 6.4 oz (68.7 kg)  03/17/23 157 lb (71.2 kg)   BP Readings from Last 3 Encounters:  05/14/23 124/72  04/13/23 122/60  11/11/22 126/72   Immunization History  Administered Date(s) Administered   Fluad Quad(high Dose 65+) 04/15/2019, 06/02/2020, 04/29/2021, 04/29/2022   Fluad Trivalent(High Dose 65+) 05/14/2023   Influenza Split 05/05/2012   Influenza Whole 08/15/2009, 06/11/2010   Influenza, High Dose Seasonal PF 06/07/2013, 04/12/2015, 07/01/2016, 04/14/2017, 04/16/2018   Influenza,inj,Quad PF,6+ Mos 04/11/2014   PFIZER(Purple Top)SARS-COV-2 Vaccination 09/23/2019, 10/14/2019, 05/28/2020   Pneumococcal Conjugate-13 10/12/2013   Pneumococcal Polysaccharide-23 08/19/1995, 08/15/2009   Td 08/19/1995, 08/15/2009   Health Maintenance Due  Topic Date Due   Zoster Vaccines- Shingrix (1 of 2) Never done   DTaP/Tdap/Td (3 - Tdap) 08/16/2019      Past Medical History:  Diagnosis Date   Alcohol abuse    hx   Anemia, iron deficiency    AR (allergic rhinitis)    BPH (benign prostatic  hypertrophy)    CAD (coronary artery disease) of artery bypass graft    CHF (congestive heart failure) (HCC)    CKD (chronic kidney disease) 09/30/2011   COPD (chronic obstructive pulmonary disease) (HCC)    Crohn's disease (HCC)    Dysuria    ED (erectile dysfunction) of organic origin    Fatigue    Glaucoma    Glucose intolerance (impaired glucose tolerance)    HLD (hyperlipidemia)    HTN (hypertension)    Impaired glucose tolerance 09/27/2011   MI (myocardial infarction) (HCC)    hx   Moderate or severe vision impairment, both eyes, impairment level not further specified    Postherpetic neuralgia    PSA elevation    Routine general medical examination at a health care facility    Shingles    Skin cancer    hx; non-melanoma   Special screening for malignant neoplasm of prostate    Urinary frequency    Past Surgical History:  Procedure Laterality Date   CORONARY ANGIOPLASTY WITH STENT PLACEMENT     CORONARY ARTERY BYPASS GRAFT     11+ years ago; has an EF of about 45%, last Myoview showed no ischemia 12/30/06   [facility]  2011   Dr. Rayburn Ma    right cataract Bilateral 07/2010   with lens implant     reports that  he quit smoking about 42 years ago. His smoking use included cigarettes. He has never used smokeless tobacco. He reports that he does not drink alcohol and does not use drugs. family history includes Heart disease in his mother. No Known Allergies Current Outpatient Medications on File Prior to Visit  Medication Sig Dispense Refill   Alcohol Swabs (B-D SINGLE USE SWABS REGULAR) PADS Use as directed twice daily E11.9 200 each 3   allopurinol (ZYLOPRIM) 100 MG tablet TAKE 1 TABLET EVERY DAY 90 tablet 3   aspirin 81 MG EC tablet Take 81 mg by mouth daily.     atorvastatin (LIPITOR) 80 MG tablet TAKE 1 TABLET EVERY DAY AT 6:00 PM 90 tablet 3   Blood Glucose Calibration (TRUE METRIX LEVEL 1) Low SOLN Use as directed once daily E11.9 3 each 3   blood glucose meter kit and  supplies KIT Use daily to check blood sugar levels. 1 each 0   carvedilol (COREG) 12.5 MG tablet TAKE 1 TABLET TWICE DAILY WITH MEALS 180 tablet 3   colchicine 0.6 MG tablet Take 1 tablet (0.6 mg total) by mouth daily. 60 tablet 5   ezetimibe (ZETIA) 10 MG tablet TAKE 1 TABLET EVERY DAY 90 tablet 0   HYDROcodone-acetaminophen (NORCO/VICODIN) 5-325 MG tablet Take 1-2 tablets by mouth every 4 (four) hours as needed. 10 tablet 0   Multiple Vitamins-Minerals (CENTRUM PO) Take 1 tablet by mouth daily.     nitroGLYCERIN (NITROSTAT) 0.4 MG SL tablet Place 0.4 mg under the tongue every 5 (five) minutes as needed for chest pain (Call 911 at 3rd dose within 15 minutes).     solifenacin (VESICARE) 5 MG tablet Take 1 tablet (5 mg total) by mouth daily. 90 tablet 3   spironolactone (ALDACTONE) 25 MG tablet TAKE 1 TABLET EVERY DAY 90 tablet 2   traMADol (ULTRAM) 50 MG tablet Take 1 tablet (50 mg total) by mouth every 6 (six) hours as needed. 30 tablet 0   triamcinolone (NASACORT) 55 MCG/ACT AERO nasal inhaler Place 2 sprays into the nose daily. 1 Inhaler 12   cetirizine (ZYRTEC) 10 MG tablet Take 1 tablet (10 mg total) by mouth daily. 30 tablet 11   No current facility-administered medications on file prior to visit.        ROS:  All others reviewed and negative.  Objective        PE:  BP 124/72 (BP Location: Right Arm, Patient Position: Sitting, Cuff Size: Normal)   Pulse (!) 49   Temp 97.9 F (36.6 C) (Oral)   Ht 5' 10.5" (1.791 m)   Wt 151 lb (68.5 kg)   SpO2 98%   BMI 21.36 kg/m                 Constitutional: Pt appears in NAD               HENT: Head: NCAT.                Right Ear: External ear normal.                 Left Ear: External ear normal.                Eyes: . Pupils are equal, round, and reactive to light. Conjunctivae and EOM are normal               Nose: without d/c or deformity  Neck: Neck supple. Gross normal ROM               Cardiovascular: Normal rate and  regular rhythm.                 Pulmonary/Chest: Effort normal and breath sounds without rales or wheezing.                Abd:  Soft, NT, ND, + BS, no organomegaly               Neurological: Pt is alert. At baseline orientation, motor grossly intact; right shoulder tender posteriorly with reduced ROM               Skin: Skin is warm. No rashes, no other new lesions, LE edema - none               Psychiatric: Pt behavior is normal without agitation   Micro: none  Cardiac tracings I have personally interpreted today:  none  Pertinent Radiological findings (summarize): none   Lab Results  Component Value Date   WBC 4.2 05/14/2023   HGB 12.4 (L) 05/14/2023   HCT 38.5 (L) 05/14/2023   PLT 178.0 05/14/2023   GLUCOSE 101 (H) 05/14/2023   CHOL 111 05/14/2023   TRIG 47.0 05/14/2023   HDL 46.80 05/14/2023   LDLCALC 55 05/14/2023   ALT 25 05/14/2023   AST 27 05/14/2023   NA 140 05/14/2023   K 4.9 05/14/2023   CL 107 05/14/2023   CREATININE 1.91 (H) 05/14/2023   BUN 21 05/14/2023   CO2 28 05/14/2023   TSH 4.51 10/23/2022   PSA 4.93 (H) 04/29/2021   HGBA1C 5.9 11/07/2022   MICROALBUR 1.2 10/23/2022   Assessment/Plan:  Gary Levy is a 82 y.o. Black or African American [2] male with  has a past medical history of Alcohol abuse, Anemia, iron deficiency, AR (allergic rhinitis), BPH (benign prostatic hypertrophy), CAD (coronary artery disease) of artery bypass graft, CHF (congestive heart failure) (HCC), CKD (chronic kidney disease) (09/30/2011), COPD (chronic obstructive pulmonary disease) (HCC), Crohn's disease (HCC), Dysuria, ED (erectile dysfunction) of organic origin, Fatigue, Glaucoma, Glucose intolerance (impaired glucose tolerance), HLD (hyperlipidemia), HTN (hypertension), Impaired glucose tolerance (09/27/2011), MI (myocardial infarction) (HCC), Moderate or severe vision impairment, both eyes, impairment level not further specified, Postherpetic neuralgia, PSA elevation, Routine  general medical examination at a health care facility, Shingles, Skin cancer, Special screening for malignant neoplasm of prostate, and Urinary frequency.  Encounter for well adult exam with abnormal findings Age and sex appropriate education and counseling updated with regular exercise and diet Referrals for preventative services - none needed Immunizations addressed - for tdap and shingrix at pharmacy, for flu shot today Smoking counseling  - none needed Evidence for depression or other mood disorder - none significant Most recent labs reviewed. I have personally reviewed and have noted: 1) the patient's medical and social history 2) The patient's current medications and supplements 3) The patient's height, weight, and BMI have been recorded in the chart   Essential hypertension BP Readings from Last 3 Encounters:  05/14/23 124/72  04/13/23 122/60  11/11/22 126/72   Stable, pt to continue medical treatment coreg 12.5 bid,    Hyperlipidemia Lab Results  Component Value Date   LDLCALC 55 05/14/2023   Stable, pt to continue current statin lipitor 80 qd   Impaired glucose tolerance Lab Results  Component Value Date   HGBA1C 5.9 11/07/2022   Stable, pt to continue current medical treatment  diet, wt control   Vitamin D deficiency Last vitamin D Lab Results  Component Value Date   VD25OH 22.35 (L) 05/14/2023   Low, to start oral replacement   CKD (chronic kidney disease) Lab Results  Component Value Date   CREATININE 1.91 (H) 05/14/2023   Mild worsening overall, cont to avoid nephrotoxins, refer renal   Chronic right shoulder pain With acute worsening, ? Rot cuff disorder vs bursitis - for referral sports medicine  Followup: Return in about 6 months (around 11/11/2023).  Oliver Barre, MD 05/17/2023 1:44 PM  [facility]

## 2023-05-14 NOTE — Progress Notes (Signed)
The test results show that your current treatment is OK, as this tests is stable.  Please continue the same plan.  The other lab testing is pending.   There is no other need for change of treatment or further evaluation based on these results, at this time.  thanks

## 2023-05-14 NOTE — Patient Instructions (Addendum)
Please have your Shingrix (shingles) shots done at your local pharmacy., and the Tdap tetanus shot as well  Please continue all other medications as before, and refills have been done if requested.  Please have the pharmacy call with any other refills you may need.  Please continue your efforts at being more active, low cholesterol diet, and weight control.  You are otherwise up to date with prevention measures today.  Please keep your appointments with your specialists as you may have planned  You will be contacted regarding the referral for: Sports Medicine  Please go to the LAB at the blood drawing area for the tests to be done  You will be contacted by phone if any changes need to be made immediately.  Otherwise, you will receive a letter about your results with an explanation, but please check with MyChart first.  Please make an Appointment to return in 6 months, or sooner if needed

## 2023-05-15 NOTE — Progress Notes (Unsigned)
    Aleen [facility]ells [facility] [address] [place] [address] Phone: [phone]   Assessment and Plan:     There are no diagnoses linked to this encounter.  ***   Pertinent previous records reviewed include ***   Follow Up: ***     Subjective:   I, Anatalia Kronk, am serving as a Neurosurgeon for Doctor Richardean Sale  Chief Complaint: right shoulder pain   HPI:   05/18/2023 Patient is a 82 year old male complaining of right shoulder pain. Patient states  Relevant Historical Information: ***  Additional pertinent review of systems negative.   Current Outpatient Medications:    Alcohol Swabs (B-D SINGLE USE SWABS REGULAR) PADS, Use as directed twice daily E11.9, Disp: 200 each, Rfl: 3   allopurinol (ZYLOPRIM) 100 MG tablet, TAKE 1 TABLET EVERY DAY, Disp: 90 tablet, Rfl: 3   aspirin 81 MG EC tablet, Take 81 mg by mouth daily., Disp: , Rfl:    atorvastatin (LIPITOR) 80 MG tablet, TAKE 1 TABLET EVERY DAY AT 6:00 PM, Disp: 90 tablet, Rfl: 3   Blood Glucose Calibration (TRUE METRIX LEVEL 1) Low SOLN, Use as directed once daily E11.9, Disp: 3 each, Rfl: 3   blood glucose meter kit and supplies KIT, Use daily to check blood sugar levels., Disp: 1 each, Rfl: 0   carvedilol (COREG) 12.5 MG tablet, TAKE 1 TABLET TWICE DAILY WITH MEALS, Disp: 180 tablet, Rfl: 3   cetirizine (ZYRTEC) 10 MG tablet, Take 1 tablet (10 mg total) by mouth daily., Disp: 30 tablet, Rfl: 11   colchicine 0.6 MG tablet, Take 1 tablet (0.6 mg total) by mouth daily., Disp: 60 tablet, Rfl: 5   ezetimibe (ZETIA) 10 MG tablet, TAKE 1 TABLET EVERY DAY, Disp: 90 tablet, Rfl: 0   HYDROcodone-acetaminophen (NORCO/VICODIN) 5-325 MG tablet, Take 1-2 tablets by mouth every 4 (four) hours as needed., Disp: 10 tablet, Rfl: 0   Multiple Vitamins-Minerals (CENTRUM PO), Take 1 tablet by mouth daily., Disp: , Rfl:    nitroGLYCERIN (NITROSTAT) 0.4 MG SL tablet, Place 0.4 mg under the tongue  every 5 (five) minutes as needed for chest pain (Call 911 at 3rd dose within 15 minutes)., Disp: , Rfl:    solifenacin (VESICARE) 5 MG tablet, Take 1 tablet (5 mg total) by mouth daily., Disp: 90 tablet, Rfl: 3   spironolactone (ALDACTONE) 25 MG tablet, TAKE 1 TABLET EVERY DAY, Disp: 90 tablet, Rfl: 2   traMADol (ULTRAM) 50 MG tablet, Take 1 tablet (50 mg total) by mouth every 6 (six) hours as needed., Disp: 30 tablet, Rfl: 0   triamcinolone (NASACORT) 55 MCG/ACT AERO nasal inhaler, Place 2 sprays into the nose daily., Disp: 1 Inhaler, Rfl: 12   Objective:     There were no vitals filed for this visit.    There is no height or weight on file to calculate BMI.    Physical Exam:    ***   Electronically signed by:  Aleen [facility]ells [facility] 1:39 PM 05/15/23

## 2023-05-17 DIAGNOSIS — G8929 Other chronic pain: Secondary | ICD-10-CM | POA: Insufficient documentation

## 2023-05-17 NOTE — Assessment & Plan Note (Signed)
Lab Results  Component Value Date   LDLCALC 55 05/14/2023   Stable, pt to continue current statin lipitor 80 qd

## 2023-05-17 NOTE — Assessment & Plan Note (Addendum)
Age and sex appropriate education and counseling updated with regular exercise and diet Referrals for preventative services - none needed Immunizations addressed - for tdap and shingrix at pharmacy, for flu shot today Smoking counseling  - none needed Evidence for depression or other mood disorder - none significant Most recent labs reviewed. I have personally reviewed and have noted: 1) the patient's medical and social history 2) The patient's current medications and supplements 3) The patient's height, weight, and BMI have been recorded in the chart

## 2023-05-17 NOTE — Assessment & Plan Note (Signed)
BP Readings from Last 3 Encounters:  05/14/23 124/72  04/13/23 122/60  11/11/22 126/72   Stable, pt to continue medical treatment coreg 12.5 bid,

## 2023-05-17 NOTE — Assessment & Plan Note (Signed)
Lab Results  Component Value Date   HGBA1C 5.9 11/07/2022   Stable, pt to continue current medical treatment   diet, wt control

## 2023-05-17 NOTE — Assessment & Plan Note (Signed)
Last vitamin D Lab Results  Component Value Date   VD25OH 22.35 (L) 05/14/2023   Low, to start oral replacement

## 2023-05-17 NOTE — Assessment & Plan Note (Signed)
With acute worsening, ? Rot cuff disorder vs bursitis - for referral sports medicine

## 2023-05-17 NOTE — Assessment & Plan Note (Addendum)
Lab Results  Component Value Date   CREATININE 1.91 (H) 05/14/2023   Mild worsening overall, cont to avoid nephrotoxins, refer renal

## 2023-05-18 ENCOUNTER — Ambulatory Visit: Payer: Medicare HMO | Admitting: Sports Medicine

## 2023-05-18 ENCOUNTER — Ambulatory Visit (INDEPENDENT_AMBULATORY_CARE_PROVIDER_SITE_OTHER): Payer: Medicare HMO

## 2023-05-18 VITALS — BP 118/80 | HR 60 | Ht 70.0 in

## 2023-05-18 DIAGNOSIS — M19011 Primary osteoarthritis, right shoulder: Secondary | ICD-10-CM | POA: Diagnosis not present

## 2023-05-18 DIAGNOSIS — M25511 Pain in right shoulder: Secondary | ICD-10-CM

## 2023-05-18 DIAGNOSIS — G8929 Other chronic pain: Secondary | ICD-10-CM | POA: Diagnosis not present

## 2023-05-18 DIAGNOSIS — M25711 Osteophyte, right shoulder: Secondary | ICD-10-CM | POA: Diagnosis not present

## 2023-05-18 NOTE — Patient Instructions (Signed)
Shoulder HEP  4-5 week follow up  Can use tylenol as needed for pain relief And topical Voltaren gel over areas of pain

## 2023-05-20 ENCOUNTER — Other Ambulatory Visit: Payer: Self-pay

## 2023-05-20 ENCOUNTER — Other Ambulatory Visit: Payer: Self-pay | Admitting: Internal Medicine

## 2023-06-02 NOTE — Progress Notes (Signed)
Aleen [facility]ells [facility] [address] [place] [address] Phone: [phone]   Assessment and Plan:     1. Chronic right shoulder pain 2. Primary osteoarthritis of right shoulder  -Chronic with exacerbation, subsequent visit - Gary Levy had some improvements in range of motion and pain after subacromial CSI, however he continues to have daily shoulder pain, worse with movements and sleeping on right shoulder consistent with intra-articular shoulder pathology such as osteoarthritis seen on x-ray - Gary Levy elected for intra-articular shoulder CSI.  Tolerated well per note below.  CSI may temporarily increase blood pressure in Gary Levy with past medical history of hypertension - Gary Levy may continue to use Tylenol and Voltaren gel for day-to-day pain relief  Procedure: Ultrasound Guided Glenohumeral Joint Injection Side: Right Diagnosis: Flare of osteoarthritis [place] Indication:  - accuracy is paramount for diagnosis - to ensure therapeutic efficacy or procedural success - to reduce procedural risk  After explaining the procedure, viable alternatives, risks, and answering any questions, consent was given verbally. The site was cleaned with chlorhexidine prep. An ultrasound transducer was placed on the posterior shoulder.  The posterior capsule, labrum, and infraspinatus were identified.  A steroid injection was performed under ultrasound guidance with sterile technique using  2ml of 1% lidocaine without epinephrine and 40 mg of triamcinolone (KENALOG) 40mg /ml. This was well tolerated and resulted in  relief.  Needle was removed and dressing placed and post injection instructions were given including  a discussion of likely return of pain today after the anesthetic wears off (with the possibility of worsened pain) until the steroid starts to work in 1-3 days.   Pt was advised to call or return to clinic if these symptoms worsen or fail to improve as  anticipated.     Pertinent previous records reviewed include none   Follow Up: 4 to 6 weeks for reevaluation.  If no improvement or worsening of symptoms, could consider physical therapy, advanced imaging, versus discussing surgical options   Subjective:   I, Gary Levy, am serving as a Neurosurgeon for Doctor Richardean Sale   Chief Complaint: right shoulder pain    HPI:    05/18/2023 Gary Levy is a 82 year old male complaining of right shoulder pain. Gary Levy states that he is not able to use his shoulder with out help. Decreased ROM. Pain for a couple of months. No MOI. Pain radiates down the arm. Using creams and that doesn't help. Pain is flared when he is laying down to sleep. Decreased grip strength.   06/15/2023 Gary Levy states CSI did not help still has pain    Relevant Historical Information: Hypertension, CKD, hyperlipidemia, COPD, CHF  Additional pertinent review of systems negative.   Current Outpatient Medications:    Alcohol Swabs (B-D SINGLE USE SWABS REGULAR) PADS, Use as directed twice daily E11.9, Disp: 200 each, Rfl: 3   allopurinol (ZYLOPRIM) 100 MG tablet, TAKE 1 TABLET EVERY DAY, Disp: 90 tablet, Rfl: 3   aspirin 81 MG EC tablet, Take 81 mg by mouth daily., Disp: , Rfl:    atorvastatin (LIPITOR) 80 MG tablet, TAKE 1 TABLET EVERY DAY AT 6:00 PM, Disp: 90 tablet, Rfl: 3   Blood Glucose Calibration (TRUE METRIX LEVEL 1) Low SOLN, Use as directed once daily E11.9, Disp: 3 each, Rfl: 3   blood glucose meter kit and supplies KIT, Use daily to check blood sugar levels., Disp: 1 each, Rfl: 0   carvedilol (COREG) 12.5 MG tablet, TAKE 1 TABLET TWICE DAILY  WITH MEALS, Disp: 180 tablet, Rfl: 3   cetirizine (ZYRTEC) 10 MG tablet, Take 1 tablet (10 mg total) by mouth daily., Disp: 30 tablet, Rfl: 11   colchicine 0.6 MG tablet, Take 1 tablet (0.6 mg total) by mouth daily., Disp: 60 tablet, Rfl: 5   ezetimibe (ZETIA) 10 MG tablet, TAKE 1 TABLET EVERY DAY, Disp: 90 tablet, Rfl:  0   HYDROcodone-acetaminophen (NORCO/VICODIN) 5-325 MG tablet, Take 1-2 tablets by mouth every 4 (four) hours as needed., Disp: 10 tablet, Rfl: 0   Multiple Vitamins-Minerals (CENTRUM PO), Take 1 tablet by mouth daily., Disp: , Rfl:    nitroGLYCERIN (NITROSTAT) 0.4 MG SL tablet, Place 0.4 mg under the tongue every 5 (five) minutes as needed for chest pain (Call 911 at 3rd dose within 15 minutes)., Disp: , Rfl:    solifenacin (VESICARE) 5 MG tablet, Take 1 tablet (5 mg total) by mouth daily., Disp: 90 tablet, Rfl: 3   spironolactone (ALDACTONE) 25 MG tablet, TAKE 1 TABLET EVERY DAY, Disp: 90 tablet, Rfl: 2   traMADol (ULTRAM) 50 MG tablet, Take 1 tablet (50 mg total) by mouth every 6 (six) hours as needed., Disp: 30 tablet, Rfl: 0   triamcinolone (NASACORT) 55 MCG/ACT AERO nasal inhaler, Place 2 sprays into the nose daily., Disp: 1 Inhaler, Rfl: 12   Objective:     Vitals:   06/15/23 0908  BP: 132/86  Pulse: (!) 51  SpO2: 95%  Weight: 158 lb (71.7 kg)  Height: 5\' 10"  (1.778 m)      Body mass index is 22.67 kg/m.    Physical Exam:    Gen: Appears well, nad, nontoxic and pleasant Neuro:sensation intact, strength is 5/5 with df/pf/inv/ev, muscle tone wnl Skin: no suspicious lesion or defmority Psych: A&O, appropriate mood and affect   Right shoulder:  No deformity, swelling or muscle wasting No scapular winging FF 100, abd 90, int 20, ext 70 NTTP over the [place], clavicle, ac, coracoid, biceps groove, humerus, deltoid, trapezius, cervical spine Special testing limited due to reduced ROM FROM of neck       Electronically signed by:  Aleen [facility] 12:18 PM 06/15/23

## 2023-06-15 ENCOUNTER — Other Ambulatory Visit: Payer: Self-pay

## 2023-06-15 ENCOUNTER — Ambulatory Visit: Payer: Medicare HMO | Admitting: Sports Medicine

## 2023-06-15 VITALS — BP 132/86 | HR 51 | Ht 70.0 in | Wt 158.0 lb

## 2023-06-15 DIAGNOSIS — M25511 Pain in right shoulder: Secondary | ICD-10-CM

## 2023-06-15 DIAGNOSIS — M19011 Primary osteoarthritis, right shoulder: Secondary | ICD-10-CM | POA: Diagnosis not present

## 2023-06-15 DIAGNOSIS — G8929 Other chronic pain: Secondary | ICD-10-CM | POA: Diagnosis not present

## 2023-06-15 NOTE — Patient Instructions (Signed)
Tylenol for day to day pain relief Follow up in 4-6 weeks

## 2023-06-17 ENCOUNTER — Other Ambulatory Visit: Payer: Self-pay | Admitting: Internal Medicine

## 2023-06-19 ENCOUNTER — Other Ambulatory Visit: Payer: Self-pay

## 2023-07-01 ENCOUNTER — Telehealth: Payer: Self-pay | Admitting: Internal Medicine

## 2023-07-01 DIAGNOSIS — H538 Other visual disturbances: Secondary | ICD-10-CM

## 2023-07-01 NOTE — Telephone Encounter (Signed)
Ok this is done 

## 2023-07-01 NOTE — Telephone Encounter (Signed)
Patient wants new optometrist and was told he needs a referral from his PCP. He would like a referral sent to [facility] on [address] in [place]. Best callback is [phone].

## 2023-07-10 NOTE — Progress Notes (Unsigned)
    Aleen [facility] [address] [place] [address] Phone: [phone]   Assessment and Plan:     There are no diagnoses linked to this encounter.  ***   Pertinent previous records reviewed include ***    Follow Up: ***     Subjective:   I, Gary Levy, am serving as a Neurosurgeon for Doctor Richardean Sale   Chief Complaint: right shoulder pain    HPI:    05/18/2023 Patient is a 82 year old male complaining of right shoulder pain. Patient states that he is not able to use his shoulder with out help. Decreased ROM. Pain for a couple of months. No MOI. Pain radiates down the arm. Using creams and that doesn't help. Pain is flared when he is laying down to sleep. Decreased grip strength.    06/15/2023 Patient states CSI did not help still has pain   07/13/2023 Patient states   Relevant Historical Information: Hypertension, CKD, hyperlipidemia, COPD, CHF  Additional pertinent review of systems negative.   Current Outpatient Medications:    Alcohol Swabs (B-D SINGLE USE SWABS REGULAR) PADS, Use as directed twice daily E11.9, Disp: 200 each, Rfl: 3   allopurinol (ZYLOPRIM) 100 MG tablet, TAKE 1 TABLET EVERY DAY, Disp: 90 tablet, Rfl: 3   aspirin 81 MG EC tablet, Take 81 mg by mouth daily., Disp: , Rfl:    atorvastatin (LIPITOR) 80 MG tablet, TAKE 1 TABLET EVERY DAY AT 6:00 PM, Disp: 90 tablet, Rfl: 3   Blood Glucose Calibration (TRUE METRIX LEVEL 1) Low SOLN, Use as directed once daily E11.9, Disp: 3 each, Rfl: 3   blood glucose meter kit and supplies KIT, Use daily to check blood sugar levels., Disp: 1 each, Rfl: 0   carvedilol (COREG) 12.5 MG tablet, TAKE 1 TABLET TWICE DAILY WITH MEALS, Disp: 180 tablet, Rfl: 3   cetirizine (ZYRTEC) 10 MG tablet, Take 1 tablet (10 mg total) by mouth daily., Disp: 30 tablet, Rfl: 11   colchicine 0.6 MG tablet, Take 1 tablet (0.6 mg total) by mouth daily., Disp: 60 tablet, Rfl: 5   ezetimibe  (ZETIA) 10 MG tablet, TAKE 1 TABLET EVERY DAY, Disp: 90 tablet, Rfl: 0   HYDROcodone-acetaminophen (NORCO/VICODIN) 5-325 MG tablet, Take 1-2 tablets by mouth every 4 (four) hours as needed., Disp: 10 tablet, Rfl: 0   Multiple Vitamins-Minerals (CENTRUM PO), Take 1 tablet by mouth daily., Disp: , Rfl:    nitroGLYCERIN (NITROSTAT) 0.4 MG SL tablet, Place 0.4 mg under the tongue every 5 (five) minutes as needed for chest pain (Call 911 at 3rd dose within 15 minutes)., Disp: , Rfl:    solifenacin (VESICARE) 5 MG tablet, Take 1 tablet (5 mg total) by mouth daily., Disp: 90 tablet, Rfl: 3   spironolactone (ALDACTONE) 25 MG tablet, TAKE 1 TABLET EVERY DAY, Disp: 90 tablet, Rfl: 2   traMADol (ULTRAM) 50 MG tablet, Take 1 tablet (50 mg total) by mouth every 6 (six) hours as needed., Disp: 30 tablet, Rfl: 0   triamcinolone (NASACORT) 55 MCG/ACT AERO nasal inhaler, Place 2 sprays into the nose daily., Disp: 1 Inhaler, Rfl: 12   Objective:     There were no vitals filed for this visit.    There is no height or weight on file to calculate BMI.    Physical Exam:    ***   Electronically signed by:  Aleen [facility]ells [facility] 10:08 AM 07/10/23

## 2023-07-13 ENCOUNTER — Ambulatory Visit: Payer: Medicare HMO | Admitting: Sports Medicine

## 2023-07-13 VITALS — HR 96 | Ht 70.0 in | Wt 159.0 lb

## 2023-07-13 DIAGNOSIS — M19011 Primary osteoarthritis, right shoulder: Secondary | ICD-10-CM | POA: Diagnosis not present

## 2023-07-13 DIAGNOSIS — M25511 Pain in right shoulder: Secondary | ICD-10-CM

## 2023-07-13 DIAGNOSIS — G8929 Other chronic pain: Secondary | ICD-10-CM

## 2023-07-13 NOTE — Patient Instructions (Signed)
Continue HEP  Call if you would like  PT referral  2 month follow up

## 2023-08-03 ENCOUNTER — Other Ambulatory Visit: Payer: Self-pay | Admitting: Cardiovascular Disease

## 2023-08-11 ENCOUNTER — Other Ambulatory Visit: Payer: Self-pay

## 2023-08-11 ENCOUNTER — Emergency Department (HOSPITAL_BASED_OUTPATIENT_CLINIC_OR_DEPARTMENT_OTHER)
Admission: EM | Admit: 2023-08-11 | Discharge: 2023-08-11 | Disposition: A | Discharge status: Home / Self Care | Payer: Medicare HMO | Attending: Emergency Medicine | Admitting: Emergency Medicine

## 2023-08-11 ENCOUNTER — Encounter (HOSPITAL_BASED_OUTPATIENT_CLINIC_OR_DEPARTMENT_OTHER): Payer: Self-pay | Admitting: Emergency Medicine

## 2023-08-11 DIAGNOSIS — Z7982 Long term (current) use of aspirin: Secondary | ICD-10-CM | POA: Diagnosis not present

## 2023-08-11 DIAGNOSIS — I110 Hypertensive heart disease with heart failure: Secondary | ICD-10-CM | POA: Diagnosis not present

## 2023-08-11 DIAGNOSIS — R040 Epistaxis: Secondary | ICD-10-CM | POA: Insufficient documentation

## 2023-08-11 DIAGNOSIS — J449 Chronic obstructive pulmonary disease, unspecified: Secondary | ICD-10-CM | POA: Insufficient documentation

## 2023-08-11 DIAGNOSIS — I10 Essential (primary) hypertension: Secondary | ICD-10-CM | POA: Diagnosis not present

## 2023-08-11 DIAGNOSIS — Z79899 Other long term (current) drug therapy: Secondary | ICD-10-CM | POA: Insufficient documentation

## 2023-08-11 DIAGNOSIS — I509 Heart failure, unspecified: Secondary | ICD-10-CM | POA: Insufficient documentation

## 2023-08-11 MED ORDER — TRANEXAMIC ACID 1000 MG/10ML IV SOLN
500.0000 mg | Freq: Once | INTRAVENOUS | Status: AC
  Administered 2023-08-11: 500 mg via TOPICAL
  Filled 2023-08-11: qty 10

## 2023-08-11 MED ORDER — CARVEDILOL 12.5 MG PO TABS: 12.5000 mg | ORAL_TABLET | Freq: Two times a day (BID) | ORAL | Status: DC

## 2023-08-11 MED ORDER — SPIRONOLACTONE 12.5 MG HALF TABLET
25.0000 mg | ORAL_TABLET | Freq: Once | ORAL | Status: AC
  Administered 2023-08-11: 25 mg via ORAL
  Filled 2023-08-11: qty 2

## 2023-08-11 MED ORDER — LIDOCAINE-EPINEPHRINE 2 %-1:100000 IJ SOLN: 20.0000 mL | Freq: Once | INTRAMUSCULAR | Status: DC

## 2023-08-11 MED ORDER — OXYMETAZOLINE HCL 0.05 % NA SOLN
1.0000 | Freq: Once | NASAL | Status: AC
  Administered 2023-08-11: 1 via NASAL
  Filled 2023-08-11: qty 30

## 2023-08-11 MED ORDER — LIDOCAINE-EPINEPHRINE (PF) 2 %-1:200000 IJ SOLN
INTRAMUSCULAR | Status: AC
  Filled 2023-08-11: qty 20

## 2023-08-11 NOTE — ED Provider Notes (Signed)
[place] EMERGENCY DEPARTMENT AT [facility] Provider Note   CSN: [id number] Arrival date & time: 08/11/23  1517     History  Chief Complaint  Patient presents with   Epistaxis    Onterrio Ruley is a 82 y.o. male.  Patient with history of CHF, COPD, hypertension, hyperlipidemia, MI presents today with complaints of epistaxis. He states that same began when he woke up this morning. Bleeding was originally from his left nare. States that EMS was called to his house and they did spray Afrin into his nose without achieving hemostasis and therefore presents for management of same. Does note that he gets nosebleeds intermittently, however they usually resolve on their own with direct pressure. He is not anticoagulated.   The history is provided by the patient. No language interpreter was used.  Epistaxis      Home Medications Prior to Admission medications   Medication Sig Start Date End Date Taking? Authorizing Provider  Alcohol Swabs (B-D SINGLE USE SWABS REGULAR) PADS Use as directed twice daily E11.9 12/02/19   Corwin Levins, MD  allopurinol (ZYLOPRIM) 100 MG tablet TAKE 1 TABLET EVERY DAY 05/20/23   Corwin Levins, MD  aspirin 81 MG EC tablet Take 81 mg by mouth daily.    [provider]  atorvastatin (LIPITOR) 80 MG tablet TAKE 1 TABLET EVERY DAY AT 6:00 PM 06/19/23   Corwin Levins, MD  Blood Glucose Calibration (TRUE METRIX LEVEL 1) Low SOLN Use as directed once daily E11.9 12/02/19   Corwin Levins, MD  blood glucose meter kit and supplies KIT Use daily to check blood sugar levels. 12/13/19   Corwin Levins, MD  carvedilol (COREG) 12.5 MG tablet TAKE 1 TABLET TWICE DAILY WITH MEALS 04/29/23   Corwin Levins, MD  cetirizine (ZYRTEC) 10 MG tablet Take 1 tablet (10 mg total) by mouth daily. 10/14/16 04/13/23  Corwin Levins, MD  colchicine 0.6 MG tablet Take 1 tablet (0.6 mg total) by mouth daily. 11/11/22   Corwin Levins, MD  ezetimibe (ZETIA) 10 MG tablet TAKE 1 TABLET  EVERY DAY 08/04/23   Kathleene Hazel, MD  HYDROcodone-acetaminophen (NORCO/VICODIN) 5-325 MG tablet Take 1-2 tablets by mouth every 4 (four) hours as needed. 12/31/16   Charlynne Pander, MD  Multiple Vitamins-Minerals (CENTRUM PO) Take 1 tablet by mouth daily.    [provider]  nitroGLYCERIN (NITROSTAT) 0.4 MG SL tablet Place 0.4 mg under the tongue every 5 (five) minutes as needed for chest pain (Call 911 at 3rd dose within 15 minutes).    [provider]  solifenacin (VESICARE) 5 MG tablet Take 1 tablet (5 mg total) by mouth daily. 11/11/22   Corwin Levins, MD  spironolactone (ALDACTONE) 25 MG tablet TAKE 1 TABLET EVERY DAY 01/14/23   Corwin Levins, MD  traMADol (ULTRAM) 50 MG tablet Take 1 tablet (50 mg total) by mouth every 6 (six) hours as needed. 06/07/21   Corwin Levins, MD  triamcinolone (NASACORT) 55 MCG/ACT AERO nasal inhaler Place 2 sprays into the nose daily. 11/17/18   Corwin Levins, MD      Allergies    Patient has no known allergies.    Review of Systems   Review of Systems  HENT:  Positive for nosebleeds.   All other systems reviewed and are negative.   Physical Exam Updated Vital Signs BP (!) 179/92 (BP Location: Right Arm)   Pulse 64   Temp (!) 97.5 F (  36.4 C) (Axillary)   Resp 20   SpO2 100%  Physical Exam Vitals and nursing note reviewed.  Constitutional:      General: He is not in acute distress.    Appearance: Normal appearance. He is normal weight. He is not ill-appearing, toxic-appearing or diaphoretic.  HENT:     Head: Normocephalic and atraumatic.     Nose:     Comments: Left nare with active bleeding present with bleeding in the oropharynx as well. Cardiovascular:     Rate and Rhythm: Normal rate.  Pulmonary:     Effort: Pulmonary effort is normal. No respiratory distress.  Musculoskeletal:        General: Normal range of motion.     Cervical back: Normal range of motion.  Skin:    General: Skin is warm and dry.   Neurological:     General: No focal deficit present.     Mental Status: He is alert.  Psychiatric:        Mood and Affect: Mood normal.        Behavior: Behavior normal.     ED Results / Procedures / Treatments   Labs (all labs ordered are listed, but only abnormal results are displayed) Labs Reviewed - No data to display  EKG None  Radiology No results found.  Procedures Epistaxis Management  Date/Time: 08/11/2023 1:52 PM  Performed by: Silva Bandy, PA-C Authorized by: Silva Bandy, PA-C   Consent:    Consent obtained:  Verbal   Consent given by:  Patient   Risks, benefits, and alternatives were discussed: yes     Risks discussed:  Bleeding, infection, nasal injury and pain   Alternatives discussed:  No treatment, delayed treatment, alternative treatment, observation and referral Universal protocol:    Procedure explained and questions answered to patient or proxy's satisfaction: yes     Patient identity confirmed:  Verbally with patient Procedure details:    Treatment site:  L posterior   Treatment method:  Nasal balloon   Treatment complexity:  Extensive Post-procedure details:    Assessment:  Bleeding stopped   Procedure completion:  Tolerated well, no immediate complications     Medications Ordered in ED Medications  oxymetazoline (AFRIN) 0.05 % nasal spray 1 spray (1 spray Each Nare Given 08/11/23 0948)    ED Course/ Medical Decision Making/ A&P                                 Medical Decision Making Risk OTC drugs. Prescription drug management.   Patient presents today with complaints of epistaxis beginning this morning.  He is afebrile, nontoxic-appearing, and in no acute distress with reassuring vital signs.  Physical exam reveals active bleeding from the left nare as well as within the oropharynx.  He did have Afrin spray given to him by EMS prior to arrival, however the patient notes that they did not ask him to blow his nose beforehand  and he states there was a large clot in his nose when they sprayed the Afrin and therefore low suspicion that it was adequately exposed to his nasal mucosa.  Therefore we will repeat this.  930: 2 sprays of Afrin placed in the bilateral naris after patient blew his nose. Direct pressure with nasal clip placed. Will continue to monitor  1030: Nasal clip removed and patient immediately began to have bleeding. Ordered TXA solution and lidocaine with epinephrine, nasal tampon soaked  in this and placed by my attending Dr. Karene Fry. Please see their note for further information.   1200: Patient's nare is bleeding around the nasal tampon, discussed with ENT on call Dr. Jenne Pane who prefers 10 cm Merocel which we unfortunately do not have here. He recommends trying rapid rhino with the inflatable balloon. If unsuccessful, may need transfer to [facility] for management.   1230: Rapid rhino with balloon placed in patients nare by me per above procedure which was well-tolerated. Will continue to monitor. Also given patients home bp meds as he has not had his morning doses and his elevated bp likely contributing to his symptoms.   130: Hemostasis achieved. Given referral to ENT for close follow-up outpatient to have nasal packing removed.   Evaluation and diagnostic testing in the emergency department does not suggest an emergent condition requiring admission or immediate intervention beyond what has been performed at this time.  Plan for discharge with close PCP follow-up.  Patient is understanding and amenable with plan, educated on red flag symptoms that would prompt immediate return.  Patient discharged in stable condition.   This is a shared visit with supervising physician Dr. Karene Fry who has independently evaluated patient & provided guidance in evaluation/management/disposition, in agreement with care   Final Clinical Impression(s) / ED Diagnoses Final diagnoses:  Epistaxis    Rx / DC Orders ED  Discharge Orders     None     An After Visit Summary was printed and given to the patient.     Vear Clock 08/11/23 1405    Ernie Avena, MD 08/12/23 [phone]

## 2023-08-11 NOTE — ED Notes (Signed)
After nose clamp was removed, patient's nose began bleeding again. Provider notified.

## 2023-08-11 NOTE — ED Notes (Signed)
Discharge instructions, follow up care with ENT, and pain management reviewed and explained, pt verbalized understanding with no further questions on d/c. Pt caox4, ambulatory, NAD on d/c.

## 2023-08-11 NOTE — ED Notes (Signed)
Nose clamp applied after afrin spray.

## 2023-08-11 NOTE — ED Notes (Signed)
Medications and ENT tray set up for provider for nasal packing.

## 2023-08-11 NOTE — ED Triage Notes (Signed)
Pt arrived via [facility] from home. Per EMS, pt called for nosebleed that started at approx 0710 this morning when getting out of the shower. EMS administered 2 sprays of Afrin in each nostril but report they were unable to get bleeding to completely stop. Pt does not take blood thinner med. Pt denies any recent trauma to the head. PMH HTN.

## 2023-08-11 NOTE — Discharge Instructions (Signed)
As we discussed, we have placed packing in your left nostril to stop the bleeding.  This needs to be removed in 72 hours.  I have given you a referral to ENT with a number to call to schedule a close follow-up appointment.  If you are unable to get into see them within 72 hours, you will need to return to the emergency department to have the packing removed.  In the interim, please do not blow your nose to ensure that this packing stays it.  You may tape it to your cheek with the materials been provided for you.  You may take Tylenol for discomfort.  Please ensure you are taking your home blood pressure medication as this is likely why your nosebleed in the first place.  Please call your primary doctor to schedule close follow-up appointment.  Return if development of any new or worsening symptoms.

## 2023-08-13 ENCOUNTER — Telehealth: Payer: Self-pay

## 2023-08-13 NOTE — Transitions of Care (Post Inpatient/ED Visit) (Signed)
   08/13/2023  Name: Kariem Brisbane MRN: [id number] DOB: [birth year]  Today's TOC FU Call Status: Today's TOC FU Call Status:: Unsuccessful Call (1st Attempt) Unsuccessful Call (1st Attempt) Date: 08/13/23  Attempted to reach the patient regarding the most recent Inpatient/ED visit.  Follow Up Plan: Additional outreach attempts will be made to reach the patient to complete the Transitions of Care (Post Inpatient/ED visit) call.    Devonna Oboyle, CMA  [facility] AWV Team Direct Dial: [phone]

## 2023-08-14 ENCOUNTER — Emergency Department (HOSPITAL_COMMUNITY)
Admission: EM | Admit: 2023-08-14 | Discharge: 2023-08-14 | Disposition: A | Discharge status: Home / Self Care | Payer: Medicare HMO | Attending: Emergency Medicine | Admitting: Emergency Medicine

## 2023-08-14 DIAGNOSIS — I110 Hypertensive heart disease with heart failure: Secondary | ICD-10-CM | POA: Insufficient documentation

## 2023-08-14 DIAGNOSIS — Z79899 Other long term (current) drug therapy: Secondary | ICD-10-CM | POA: Diagnosis not present

## 2023-08-14 DIAGNOSIS — I2510 Atherosclerotic heart disease of native coronary artery without angina pectoris: Secondary | ICD-10-CM | POA: Insufficient documentation

## 2023-08-14 DIAGNOSIS — Z7982 Long term (current) use of aspirin: Secondary | ICD-10-CM | POA: Diagnosis not present

## 2023-08-14 DIAGNOSIS — J449 Chronic obstructive pulmonary disease, unspecified: Secondary | ICD-10-CM | POA: Insufficient documentation

## 2023-08-14 DIAGNOSIS — Z4800 Encounter for change or removal of nonsurgical wound dressing: Secondary | ICD-10-CM | POA: Insufficient documentation

## 2023-08-14 DIAGNOSIS — I509 Heart failure, unspecified: Secondary | ICD-10-CM | POA: Diagnosis not present

## 2023-08-14 NOTE — ED Triage Notes (Signed)
Pt here for follow up to have nasal packing removed. Pt unable to get follow up with ENT. NAD noted during triage. Pt denies new nasal bleeding.

## 2023-08-14 NOTE — Discharge Instructions (Signed)
You were seen in the emergency department to have your nasal packing removed.  We were able to remove the packing without any complications.  You can sleep with a humidifier at night or could use saline nasal spray to help keep your nose moist to help prevent recurrent bleeding.  You should follow-up with the ENT doctor as scheduled.  If you have a recurrent bleed, blow out all the blood clots from your nose and hold pressure on the soft part of your nose as hard as you can for 15 to 20 minutes.  You should return to the emergency department if you are continuing to have bleeding despite holding the pressure, you become lightheaded or pass out or if you have any other new or concerning symptoms.

## 2023-08-14 NOTE — ED Provider Notes (Signed)
[place] EMERGENCY DEPARTMENT AT [facility] Provider Note   CSN: [id number] Arrival date & time: 08/14/23  2130     History  Chief Complaint  Patient presents with   Follow-up    Gary Levy is a 82 y.o. male.  Patient is an 82 year old male with a past medical history of CAD, CHF, hypertension, COPD presented to the emergency department for nasal packing removal.  The patient reports that he was seen in the ED on 12/24 and had packing placed.  He states that he was unable to get into his ENT doctor so return to the ER to have it removed.  He states it has been mildly uncomfortable but otherwise has been tolerating well without any complications, no bleeding.  States he is not on any blood thinners.  The history is provided by the patient and the spouse.       Home Medications Prior to Admission medications   Medication Sig Start Date End Date Taking? Authorizing Provider  Alcohol Swabs (B-D SINGLE USE SWABS REGULAR) PADS Use as directed twice daily E11.9 12/02/19   Corwin Levins, MD  allopurinol (ZYLOPRIM) 100 MG tablet TAKE 1 TABLET EVERY DAY 05/20/23   Corwin Levins, MD  aspirin 81 MG EC tablet Take 81 mg by mouth daily.    [provider]  atorvastatin (LIPITOR) 80 MG tablet TAKE 1 TABLET EVERY DAY AT 6:00 PM 06/19/23   Corwin Levins, MD  Blood Glucose Calibration (TRUE METRIX LEVEL 1) Low SOLN Use as directed once daily E11.9 12/02/19   Corwin Levins, MD  blood glucose meter kit and supplies KIT Use daily to check blood sugar levels. 12/13/19   Corwin Levins, MD  carvedilol (COREG) 12.5 MG tablet TAKE 1 TABLET TWICE DAILY WITH MEALS 04/29/23   Corwin Levins, MD  cetirizine (ZYRTEC) 10 MG tablet Take 1 tablet (10 mg total) by mouth daily. 10/14/16 04/13/23  Corwin Levins, MD  colchicine 0.6 MG tablet Take 1 tablet (0.6 mg total) by mouth daily. 11/11/22   Corwin Levins, MD  ezetimibe (ZETIA) 10 MG tablet TAKE 1 TABLET EVERY DAY 08/04/23   Kathleene Hazel, MD  HYDROcodone-acetaminophen (NORCO/VICODIN) 5-325 MG tablet Take 1-2 tablets by mouth every 4 (four) hours as needed. 12/31/16   Charlynne Pander, MD  Multiple Vitamins-Minerals (CENTRUM PO) Take 1 tablet by mouth daily.    [provider]  nitroGLYCERIN (NITROSTAT) 0.4 MG SL tablet Place 0.4 mg under the tongue every 5 (five) minutes as needed for chest pain (Call 911 at 3rd dose within 15 minutes).    [provider]  solifenacin (VESICARE) 5 MG tablet Take 1 tablet (5 mg total) by mouth daily. 11/11/22   Corwin Levins, MD  spironolactone (ALDACTONE) 25 MG tablet TAKE 1 TABLET EVERY DAY 01/14/23   Corwin Levins, MD  traMADol (ULTRAM) 50 MG tablet Take 1 tablet (50 mg total) by mouth every 6 (six) hours as needed. 06/07/21   Corwin Levins, MD  triamcinolone (NASACORT) 55 MCG/ACT AERO nasal inhaler Place 2 sprays into the nose daily. 11/17/18   Corwin Levins, MD      Allergies    Patient has no known allergies.    Review of Systems   Review of Systems  Physical Exam Updated Vital Signs BP (!) 179/89   Pulse 71   Temp 97.8 F (36.6 C)   Resp 16   SpO2 100%  Physical Exam  Vitals and nursing note reviewed.  Constitutional:      General: He is not in acute distress.    Appearance: Normal appearance.  HENT:     Head: Normocephalic.     Nose:     Comments: Packing in place in L nare, no blood in R nare    Mouth/Throat:     Mouth: Mucous membranes are moist.     Pharynx: Oropharynx is clear.     Comments: No blood in posterior oropharynx Eyes:     Extraocular Movements: Extraocular movements intact.  Pulmonary:     Effort: Pulmonary effort is normal.  Musculoskeletal:        General: Normal range of motion.     Cervical back: Normal range of motion.  Neurological:     General: No focal deficit present.     Mental Status: He is alert and oriented to person, place, and time.  Psychiatric:        Mood and Affect: Mood normal.        Behavior: Behavior  normal.     ED Results / Procedures / Treatments   Labs (all labs ordered are listed, but only abnormal results are displayed) Labs Reviewed - No data to display  EKG None  Radiology No results found.  Procedures Procedures    Medications Ordered in ED Medications - No data to display  ED Course/ Medical Decision Making/ A&P                                 Medical Decision Making This patient presents to the ED with chief complaint(s) of nasal packing removal with pertinent past medical history of recent nose bleed, HTN, CAD, CHF, COPD which further complicates the presenting complaint. The complaint involves an extensive differential diagnosis and also carries with it a high risk of complications and morbidity.    The differential diagnosis includes packing removal, no signs of bleeding, no signs of infection   Additional history obtained: Additional history obtained from spouse Records reviewed recent ED records  ED Course and Reassessment: Patient is well-appearing on arrival in no acute distress.  Nasal packing was removed by myself.  I completely deflated the balloon and pulled out the packing completely in 1 piece without any complication.  Left nare appeared clear.  The patient was observed for approximately 30 minutes after removal without any recurrent bleeding.  He is stable for discharge home.  Recommend outpatient ENT follow-up.  Independent labs interpretation:  N/A  Independent visualization of imaging: -N/A  Consultation: - Consulted or discussed management/test interpretation w/ external professional: N/A  Consideration for admission or further workup: Patient has no emergent conditions requiring admission or further work-up at this time and is stable for discharge home with primary care follow-up  Social Determinants of health: N/A            Final Clinical Impression(s) / ED Diagnoses Final diagnoses:  Encounter for removal of nasal  packing    Rx / DC Orders ED Discharge Orders     None         Rexford Maus, DO 08/14/23 1007

## 2023-08-14 NOTE — Transitions of Care (Post Inpatient/ED Visit) (Signed)
 08/14/2023  Name: Gary Levy MRN: [id number] DOB: Sep 06, 1940  Today's TOC FU Call Status: Today's TOC FU Call Status:: Successful TOC FU Call Completed Unsuccessful Call (1st Attempt) Date: 08/13/23 [facility] FU Call Complete Date: 08/14/23 Patient's Name and Date of Birth confirmed.  Transition Care Management Follow-up Telephone Call Date of Discharge: 08/11/23 Discharge Facility: [facility] ([facility]) Type of Discharge: Emergency Department Reason for ED Visit:  (epistaxis) How have you been since you were released from the hospital?: Better Any questions or concerns?: No  Items Reviewed: Did you receive and understand the discharge instructions provided?: Yes Medications obtained,verified, and reconciled?: Yes (Medications Reviewed) Any new allergies since your discharge?: No Dietary orders reviewed?: NA Do you have support at home?: Yes People in Home: spouse  Medications Reviewed Today: Medications Reviewed Today     Reviewed by Smrithi Pigford, Jordan Hawks, CMA (Certified Medical Assistant) on 08/14/23 at 0857  Med List Status: <None>   Medication Order Taking? Sig Documenting Provider Last Dose Status Informant  Alcohol Swabs (B-D SINGLE USE SWABS REGULAR) PADS [id number] No Use as directed twice daily E11.9 Corwin Levins, MD Taking Active   allopurinol (ZYLOPRIM) 100 MG tablet [id number]  TAKE 1 TABLET EVERY DAY Corwin Levins, MD  Active   aspirin 81 MG EC tablet [id number] No Take 81 mg by mouth daily. [provider] Taking Active   atorvastatin (LIPITOR) 80 MG tablet [id number]  TAKE 1 TABLET EVERY DAY AT 6:00 PM Corwin Levins, MD  Active   Blood Glucose Calibration (TRUE METRIX LEVEL 1) Low SOLN [id number] No Use as directed once daily E11.9 Corwin Levins, MD Taking Active   blood glucose meter kit and supplies KIT [id number] No Use daily to check blood sugar levels. Corwin Levins, MD Taking Active   carvedilol (COREG) 12.5 MG tablet [id number] No TAKE 1 TABLET TWICE  DAILY WITH MEALS Corwin Levins, MD Taking Active   cetirizine (ZYRTEC) 10 MG tablet [id number] No Take 1 tablet (10 mg total) by mouth daily. Corwin Levins, MD Taking Expired 04/13/23 2359   colchicine 0.6 MG tablet [id number] No Take 1 tablet (0.6 mg total) by mouth daily. Corwin Levins, MD Taking Active   ezetimibe (ZETIA) 10 MG tablet [id number]  TAKE 1 TABLET EVERY DAY Kathleene Hazel, MD  Active   HYDROcodone-acetaminophen (NORCO/VICODIN) 5-325 MG tablet [id number] No Take 1-2 tablets by mouth every 4 (four) hours as needed. Charlynne Pander, MD Taking Active   Multiple Vitamins-Minerals (CENTRUM PO) [id number] No Take 1 tablet by mouth daily. [provider] Taking Active   nitroGLYCERIN (NITROSTAT) 0.4 MG SL tablet [id number] No Place 0.4 mg under the tongue every 5 (five) minutes as needed for chest pain (Call 911 at 3rd dose within 15 minutes). [provider] Taking Active   solifenacin (VESICARE) 5 MG tablet [id number] No Take 1 tablet (5 mg total) by mouth daily. Corwin Levins, MD Taking Active   spironolactone (ALDACTONE) 25 MG tablet [id number] No TAKE 1 TABLET EVERY DAY Corwin Levins, MD Taking Active   traMADol (ULTRAM) 50 MG tablet [id number] No Take 1 tablet (50 mg total) by mouth every 6 (six) hours as needed. Corwin Levins, MD Taking Active   triamcinolone (NASACORT) 55 MCG/ACT AERO nasal inhaler [id number] No Place 2 sprays into the nose daily. Corwin Levins, MD Taking Active             Home Care and Equipment/Supplies: Were Home Health Services Ordered?: NA  Any new equipment or medical supplies ordered?: NA  Functional Questionnaire: Do you need assistance with bathing/showering or dressing?: No Do you need assistance with meal preparation?: No Do you need assistance with eating?: No Do you have difficulty maintaining continence: No Do you need assistance with getting out of bed/getting out of a chair/moving?: No Do you have difficulty managing or  taking your medications?: No  Follow up appointments reviewed: PCP Follow-up appointment confirmed?: Yes Date of PCP follow-up appointment?: 08/17/23 Follow-up Provider: Excell Seltzer MD Specialist Hospital Follow-up appointment confirmed?: No Reason Specialist Follow-Up Not Confirmed: Patient has Specialist Provider Number and will Call for Appointment Do you need transportation to your follow-up appointment?: No Do you understand care options if your condition(s) worsen?: Yes-patient verbalized understanding     Darnise Montag, CMA  [facility] AWV Team Direct Dial: [phone]

## 2023-08-17 ENCOUNTER — Ambulatory Visit (INDEPENDENT_AMBULATORY_CARE_PROVIDER_SITE_OTHER): Payer: Medicare HMO | Admitting: Internal Medicine

## 2023-08-17 ENCOUNTER — Encounter: Payer: Self-pay | Admitting: Internal Medicine

## 2023-08-17 VITALS — BP 118/64 | HR 62 | Temp 97.7°F | Ht 70.0 in | Wt 153.0 lb

## 2023-08-17 DIAGNOSIS — R7302 Impaired glucose tolerance (oral): Secondary | ICD-10-CM | POA: Diagnosis not present

## 2023-08-17 DIAGNOSIS — E559 Vitamin D deficiency, unspecified: Secondary | ICD-10-CM | POA: Diagnosis not present

## 2023-08-17 DIAGNOSIS — D509 Iron deficiency anemia, unspecified: Secondary | ICD-10-CM

## 2023-08-17 DIAGNOSIS — R040 Epistaxis: Secondary | ICD-10-CM | POA: Diagnosis not present

## 2023-08-17 DIAGNOSIS — I10 Essential (primary) hypertension: Secondary | ICD-10-CM

## 2023-08-17 DIAGNOSIS — N1832 Chronic kidney disease, stage 3b: Secondary | ICD-10-CM

## 2023-08-17 LAB — LIPID PANEL
Cholesterol: 109 mg/dL (ref 0–200)
HDL: 35.8 mg/dL — ABNORMAL LOW (ref >39.00)
LDL Cholesterol: 64 mg/dL (ref 0–99)
NonHDL: 72.95
Total CHOL/HDL Ratio: 3
Triglycerides: 47 mg/dL (ref 0.0–149.0)
VLDL: 9.4 mg/dL (ref 0.0–40.0)

## 2023-08-17 LAB — CBC WITH DIFFERENTIAL/PLATELET
Basophils Absolute: 0.1 10*3/uL (ref 0.0–0.1)
Basophils Relative: 1.7 % (ref 0.0–3.0)
Eosinophils Absolute: 0.2 10*3/uL (ref 0.0–0.7)
Eosinophils Relative: 5.1 % — ABNORMAL HIGH (ref 0.0–5.0)
HCT: 35.1 % — ABNORMAL LOW (ref 39.0–52.0)
Hemoglobin: 11.4 g/dL — ABNORMAL LOW (ref 13.0–17.0)
Lymphocytes Relative: 20.1 % (ref 12.0–46.0)
Lymphs Abs: 0.9 10*3/uL (ref 0.7–4.0)
MCHC: 32.4 g/dL (ref 30.0–36.0)
MCV: 89.3 fL (ref 78.0–100.0)
Monocytes Absolute: 0.5 10*3/uL (ref 0.1–1.0)
Monocytes Relative: 11.7 % (ref 3.0–12.0)
Neutro Abs: 2.9 10*3/uL (ref 1.4–7.7)
Neutrophils Relative %: 61.4 % (ref 43.0–77.0)
Platelets: 244 10*3/uL (ref 150.0–400.0)
RBC: 3.93 Mil/uL — ABNORMAL LOW (ref 4.22–5.81)
RDW: 14.8 % (ref 11.5–15.5)
WBC: 4.7 10*3/uL (ref 4.0–10.5)

## 2023-08-17 LAB — IBC PANEL
Iron: 46 ug/dL (ref 42–165)
Saturation Ratios: 13.6 % — ABNORMAL LOW (ref 20.0–50.0)
TIBC: 337.4 ug/dL (ref 250.0–450.0)
Transferrin: 241 mg/dL (ref 212.0–360.0)

## 2023-08-17 LAB — BASIC METABOLIC PANEL
BUN: 27 mg/dL — ABNORMAL HIGH (ref 6–23)
CO2: 25 meq/L (ref 19–32)
Calcium: 9.6 mg/dL (ref 8.4–10.5)
Chloride: 106 meq/L (ref 96–112)
Creatinine, Ser: 1.83 mg/dL — ABNORMAL HIGH (ref 0.40–1.50)
GFR: 33.86 mL/min — ABNORMAL LOW (ref >60.00)
Glucose, Bld: 92 mg/dL (ref 70–99)
Potassium: 4.8 meq/L (ref 3.5–5.1)
Sodium: 139 meq/L (ref 135–145)

## 2023-08-17 LAB — HEPATIC FUNCTION PANEL
ALT: 20 U/L (ref 0–53)
AST: 26 U/L (ref 0–37)
Albumin: 4.1 g/dL (ref 3.5–5.2)
Alkaline Phosphatase: 70 U/L (ref 39–117)
Bilirubin, Direct: 0.2 mg/dL (ref 0.0–0.3)
Total Bilirubin: 0.8 mg/dL (ref 0.2–1.2)
Total Protein: 7.9 g/dL (ref 6.0–8.3)

## 2023-08-17 LAB — HEMOGLOBIN A1C: Hgb A1c MFr Bld: 6.2 % (ref 4.6–6.5)

## 2023-08-17 LAB — FERRITIN: Ferritin: 30.1 ng/mL (ref 22.0–322.0)

## 2023-08-17 NOTE — Assessment & Plan Note (Signed)
Currently asympt, improved, for f/u ENT as planned

## 2023-08-17 NOTE — Assessment & Plan Note (Addendum)
Lab Results  Component Value Date   CREATININE 1.91 (H) 05/14/2023   Recently worsening overall, cont to avoid nephrotoxins, for f/u lab today

## 2023-08-17 NOTE — Assessment & Plan Note (Signed)
BP Readings from Last 3 Encounters:  08/17/23 118/64  08/14/23 (!) 179/89  08/11/23 (!) 153/78   Stable, pt to continue medical treatment coreg 12.5 bid

## 2023-08-17 NOTE — Patient Instructions (Signed)
Please continue all other medications as before, and refills have been done if requested.  Please have the pharmacy call with any other refills you may need.  Please continue your efforts at being more active, low cholesterol diet, and weight control.  Please keep your appointments with your specialists as you may have planned  Please go to the LAB at the blood drawing area for the tests to be done  You will be contacted by phone if any changes need to be made immediately.  Otherwise, you will receive a letter about your results with an explanation, but please check with MyChart first.  Please make an Appointment to return in Nov 11 2023, or sooner if needed

## 2023-08-17 NOTE — Progress Notes (Signed)
Patient ID: Gary Levy, male   DOB: August 01, 1941, 82 y.o.   MRN: [id number]        Chief Complaint: follow up epistaxix episode dec 24 with balloon out dec 27       HPI:  Gary Levy is a 82 y.o. male here with c/o above, no further bleeding, has fu appt with Dr Jenne Pane ENT.  Difficult to control initially.  No fever, pain, or worsening congestion.    [place] has renal f/u appt initial visit for Jan 7.  Pt denies chest pain, increased sob or doe, wheezing, orthopnea, PND, increased LE swelling, palpitations, dizziness or syncope.   Pt denies polydipsia, polyuria, or new focal neuro s/s.    Pt denies fever, night sweats, loss of appetite, or other constitutional symptoms  Lost wt but not sure why in the past month - approx 6 lbs.  Has somewhat less appetite but no obvious cause per pt.  Denies worsening depressive symptoms, suicidal ideation, or panic.  Right sholder improved after coritsone x  per sport medicine and PT, and also has eye doctor f/u in feb 2025.         Wt Readings from Last 3 Encounters:  08/17/23 153 lb (69.4 kg)  07/13/23 159 lb (72.1 kg)  06/15/23 158 lb (71.7 kg)   BP Readings from Last 3 Encounters:  08/17/23 118/64  08/14/23 (!) 179/89  08/11/23 (!) 153/78         Past Medical History:  Diagnosis Date   Alcohol abuse    hx   Anemia, iron deficiency    AR (allergic rhinitis)    BPH (benign prostatic hypertrophy)    CAD (coronary artery disease) of artery bypass graft    CHF (congestive heart failure) (HCC)    CKD (chronic kidney disease) 09/30/2011   COPD (chronic obstructive pulmonary disease) (HCC)    Crohn's disease (HCC)    Dysuria    ED (erectile dysfunction) of organic origin    Fatigue    Glaucoma    Glucose intolerance (impaired glucose tolerance)    HLD (hyperlipidemia)    HTN (hypertension)    Impaired glucose tolerance 09/27/2011   MI (myocardial infarction) (HCC)    hx   Moderate or severe vision impairment, both eyes, impairment level not further  specified    Postherpetic neuralgia    PSA elevation    Routine general medical examination at a health care facility    Shingles    Skin cancer    hx; non-melanoma   Special screening for malignant neoplasm of prostate    Urinary frequency    Past Surgical History:  Procedure Laterality Date   CORONARY ANGIOPLASTY WITH STENT PLACEMENT     CORONARY ARTERY BYPASS GRAFT     11+ years ago; has an EF of about 45%, last Myoview showed no ischemia 12/30/06   [facility]  2011   Dr. Rayburn Ma    right cataract Bilateral 07/2010   with lens implant     reports that he quit smoking about 42 years ago. His smoking use included cigarettes. He has never used smokeless tobacco. He reports that he does not drink alcohol and does not use drugs. family history includes Heart disease in his mother. No Known Allergies Current Outpatient Medications on File Prior to Visit  Medication Sig Dispense Refill   Alcohol Swabs (B-D SINGLE USE SWABS REGULAR) PADS Use as directed twice daily E11.9 200 each 3   allopurinol (ZYLOPRIM) 100 MG tablet TAKE 1 TABLET EVERY  DAY 90 tablet 3   aspirin 81 MG EC tablet Take 81 mg by mouth daily.     atorvastatin (LIPITOR) 80 MG tablet TAKE 1 TABLET EVERY DAY AT 6:00 PM 90 tablet 3   Blood Glucose Calibration (TRUE METRIX LEVEL 1) Low SOLN Use as directed once daily E11.9 3 each 3   blood glucose meter kit and supplies KIT Use daily to check blood sugar levels. 1 each 0   carvedilol (COREG) 12.5 MG tablet TAKE 1 TABLET TWICE DAILY WITH MEALS 180 tablet 3   colchicine 0.6 MG tablet Take 1 tablet (0.6 mg total) by mouth daily. 60 tablet 5   ezetimibe (ZETIA) 10 MG tablet TAKE 1 TABLET EVERY DAY 90 tablet 2   HYDROcodone-acetaminophen (NORCO/VICODIN) 5-325 MG tablet Take 1-2 tablets by mouth every 4 (four) hours as needed. 10 tablet 0   Multiple Vitamins-Minerals (CENTRUM PO) Take 1 tablet by mouth daily.     nitroGLYCERIN (NITROSTAT) 0.4 MG SL tablet Place 0.4 mg under the tongue  every 5 (five) minutes as needed for chest pain (Call 911 at 3rd dose within 15 minutes).     solifenacin (VESICARE) 5 MG tablet Take 1 tablet (5 mg total) by mouth daily. 90 tablet 3   spironolactone (ALDACTONE) 25 MG tablet TAKE 1 TABLET EVERY DAY 90 tablet 2   traMADol (ULTRAM) 50 MG tablet Take 1 tablet (50 mg total) by mouth every 6 (six) hours as needed. 30 tablet 0   triamcinolone (NASACORT) 55 MCG/ACT AERO nasal inhaler Place 2 sprays into the nose daily. 1 Inhaler 12   cetirizine (ZYRTEC) 10 MG tablet Take 1 tablet (10 mg total) by mouth daily. 30 tablet 11   No current facility-administered medications on file prior to visit.        ROS:  All others reviewed and negative.  Objective        PE:  BP 118/64 (BP Location: Right Arm, Patient Position: Sitting, Cuff Size: Normal)   Pulse 62   Temp 97.7 F (36.5 C) (Oral)   Ht 5\' 10"  (1.778 m)   Wt 153 lb (69.4 kg)   SpO2 98%   BMI 21.95 kg/m                 Constitutional: Pt appears in NAD               HENT: Head: NCAT.                Right Ear: External ear normal.                 Left Ear: External ear normal.                Eyes: . Pupils are equal, round, and reactive to light. Conjunctivae and EOM are normal               Nose: without d/c or deformity               Neck: Neck supple. Gross normal ROM               Cardiovascular: Normal rate and regular rhythm.                 Pulmonary/Chest: Effort normal and breath sounds without rales or wheezing.                Abd:  Soft, NT, ND, + BS, no organomegaly  Neurological: Pt is alert. At baseline orientation, motor grossly intact               Skin: Skin is warm. No rashes, no other new lesions, LE edema - none               Psychiatric: Pt behavior is normal without agitation   Micro: none  Cardiac tracings I have personally interpreted today:  none  Pertinent Radiological findings (summarize): none   Lab Results  Component Value Date   WBC 4.2  05/14/2023   HGB 12.4 (L) 05/14/2023   HCT 38.5 (L) 05/14/2023   PLT 178.0 05/14/2023   GLUCOSE 101 (H) 05/14/2023   CHOL 111 05/14/2023   TRIG 47.0 05/14/2023   HDL 46.80 05/14/2023   LDLCALC 55 05/14/2023   ALT 25 05/14/2023   AST 27 05/14/2023   NA 140 05/14/2023   K 4.9 05/14/2023   CL 107 05/14/2023   CREATININE 1.91 (H) 05/14/2023   BUN 21 05/14/2023   CO2 28 05/14/2023   TSH 4.51 10/23/2022   PSA 4.93 (H) 04/29/2021   HGBA1C 5.9 11/07/2022   MICROALBUR 1.2 10/23/2022   Assessment/Plan:  Eldar Torain is a 82 y.o. Black or African American [2] male with  has a past medical history of Alcohol abuse, Anemia, iron deficiency, AR (allergic rhinitis), BPH (benign prostatic hypertrophy), CAD (coronary artery disease) of artery bypass graft, CHF (congestive heart failure) (HCC), CKD (chronic kidney disease) (09/30/2011), COPD (chronic obstructive pulmonary disease) (HCC), Crohn's disease (HCC), Dysuria, ED (erectile dysfunction) of organic origin, Fatigue, Glaucoma, Glucose intolerance (impaired glucose tolerance), HLD (hyperlipidemia), HTN (hypertension), Impaired glucose tolerance (09/27/2011), MI (myocardial infarction) (HCC), Moderate or severe vision impairment, both eyes, impairment level not further specified, Postherpetic neuralgia, PSA elevation, Routine general medical examination at a health care facility, Shingles, Skin cancer, Special screening for malignant neoplasm of prostate, and Urinary frequency.  Essential hypertension BP Readings from Last 3 Encounters:  08/17/23 118/64  08/14/23 (!) 179/89  08/11/23 (!) 153/78   Stable, pt to continue medical treatment coreg 12.5 bid   Impaired glucose tolerance Lab Results  Component Value Date   HGBA1C 5.9 11/07/2022   Stable, pt to continue current medical treatment  - diet, wt control   Anemia, iron deficiency Also for f/u iron lab  CKD (chronic kidney disease) Lab Results  Component Value Date   CREATININE 1.91  (H) 05/14/2023   Recently worsening overall, cont to avoid nephrotoxins, for f/u lab today  Vitamin D deficiency Last vitamin D Lab Results  Component Value Date   VD25OH 22.35 (L) 05/14/2023   Low, to start oral replacement   Nosebleed Currently asympt, improved, for f/u ENT as planned  Followup: Return in about 3 months (around 11/11/2023).  Oliver Barre, MD 08/17/2023 9:01 AM [facility]

## 2023-08-17 NOTE — Assessment & Plan Note (Signed)
Also for f/u iron lab ?

## 2023-08-17 NOTE — Assessment & Plan Note (Signed)
Last vitamin D Lab Results  Component Value Date   VD25OH 22.35 (L) 05/14/2023   Low, to start oral replacement

## 2023-08-17 NOTE — Assessment & Plan Note (Signed)
Lab Results  Component Value Date   HGBA1C 5.9 11/07/2022   Stable, pt to continue current medical treatment   diet, wt control

## 2023-08-17 NOTE — Progress Notes (Signed)
The test results show that your current treatment is OK, as the tests are stable.  Please continue the same plan.  There is no other need for change of treatment or further evaluation based on these results, at this time.  thanks 

## 2023-08-21 LAB — TIQ-NTM

## 2023-08-21 LAB — PTH, INTACT AND CALCIUM: PTH: 68 pg/mL (ref 16–77)

## 2023-08-25 DIAGNOSIS — I5022 Chronic systolic (congestive) heart failure: Secondary | ICD-10-CM | POA: Diagnosis not present

## 2023-08-25 DIAGNOSIS — D509 Iron deficiency anemia, unspecified: Secondary | ICD-10-CM | POA: Diagnosis not present

## 2023-08-25 DIAGNOSIS — N1832 Chronic kidney disease, stage 3b: Secondary | ICD-10-CM | POA: Diagnosis not present

## 2023-08-25 DIAGNOSIS — I130 Hypertensive heart and chronic kidney disease with heart failure and stage 1 through stage 4 chronic kidney disease, or unspecified chronic kidney disease: Secondary | ICD-10-CM | POA: Diagnosis not present

## 2023-08-25 DIAGNOSIS — I255 Ischemic cardiomyopathy: Secondary | ICD-10-CM | POA: Diagnosis not present

## 2023-08-25 DIAGNOSIS — N2581 Secondary hyperparathyroidism of renal origin: Secondary | ICD-10-CM | POA: Diagnosis not present

## 2023-08-26 ENCOUNTER — Other Ambulatory Visit: Payer: Self-pay | Admitting: Nephrology

## 2023-08-26 DIAGNOSIS — H04123 Dry eye syndrome of bilateral lacrimal glands: Secondary | ICD-10-CM | POA: Diagnosis not present

## 2023-08-26 DIAGNOSIS — N1832 Chronic kidney disease, stage 3b: Secondary | ICD-10-CM

## 2023-08-26 DIAGNOSIS — D23122 Other benign neoplasm of skin of left lower eyelid, including canthus: Secondary | ICD-10-CM | POA: Diagnosis not present

## 2023-08-26 DIAGNOSIS — H40013 Open angle with borderline findings, low risk, bilateral: Secondary | ICD-10-CM | POA: Diagnosis not present

## 2023-08-26 DIAGNOSIS — H43813 Vitreous degeneration, bilateral: Secondary | ICD-10-CM | POA: Diagnosis not present

## 2023-08-26 DIAGNOSIS — Z961 Presence of intraocular lens: Secondary | ICD-10-CM | POA: Diagnosis not present

## 2023-08-27 ENCOUNTER — Ambulatory Visit
Admission: RE | Admit: 2023-08-27 | Discharge: 2023-08-27 | Disposition: A | Discharge status: Home / Self Care | Payer: Medicare HMO | Source: Ambulatory Visit | Attending: Nephrology | Admitting: Nephrology

## 2023-08-27 DIAGNOSIS — R350 Frequency of micturition: Secondary | ICD-10-CM | POA: Diagnosis not present

## 2023-08-27 DIAGNOSIS — N1832 Chronic kidney disease, stage 3b: Secondary | ICD-10-CM

## 2023-08-27 DIAGNOSIS — N401 Enlarged prostate with lower urinary tract symptoms: Secondary | ICD-10-CM | POA: Diagnosis not present

## 2023-09-04 NOTE — Progress Notes (Unsigned)
Aleen Sells [facility] [address] [place] [address] Phone: [phone]   Assessment and Plan:     There are no diagnoses linked to this encounter.  ***   Pertinent previous records reviewed include ***    Follow Up: ***     Subjective:   I, Gary Levy, am serving as a Neurosurgeon for Doctor Richardean Sale   Chief Complaint: right shoulder pain    HPI:    05/18/2023 Patient is a 83 year old male complaining of right shoulder pain. Patient states that he is not able to use his shoulder with out help. Decreased ROM. Pain for a couple of months. No MOI. Pain radiates down the arm. Using creams and that doesn't help. Pain is flared when he is laying down to sleep. Decreased grip strength.    06/15/2023 Patient states CSI did not help still has pain    07/13/2023 Patient states that he is doing so much better   09/07/2023 Patient states   Relevant Historical Information: Hypertension, CKD, hyperlipidemia, COPD, CHF  Additional pertinent review of systems negative.   Current Outpatient Medications:    Alcohol Swabs (B-D SINGLE USE SWABS REGULAR) PADS, Use as directed twice daily E11.9, Disp: 200 each, Rfl: 3   allopurinol (ZYLOPRIM) 100 MG tablet, TAKE 1 TABLET EVERY DAY, Disp: 90 tablet, Rfl: 3   aspirin 81 MG EC tablet, Take 81 mg by mouth daily., Disp: , Rfl:    atorvastatin (LIPITOR) 80 MG tablet, TAKE 1 TABLET EVERY DAY AT 6:00 PM, Disp: 90 tablet, Rfl: 3   Blood Glucose Calibration (TRUE METRIX LEVEL 1) Low SOLN, Use as directed once daily E11.9, Disp: 3 each, Rfl: 3   blood glucose meter kit and supplies KIT, Use daily to check blood sugar levels., Disp: 1 each, Rfl: 0   carvedilol (COREG) 12.5 MG tablet, TAKE 1 TABLET TWICE DAILY WITH MEALS, Disp: 180 tablet, Rfl: 3   cetirizine (ZYRTEC) 10 MG tablet, Take 1 tablet (10 mg total) by mouth daily., Disp: 30 tablet, Rfl: 11   colchicine 0.6 MG tablet, Take 1 tablet (0.6  mg total) by mouth daily., Disp: 60 tablet, Rfl: 5   ezetimibe (ZETIA) 10 MG tablet, TAKE 1 TABLET EVERY DAY, Disp: 90 tablet, Rfl: 2   HYDROcodone-acetaminophen (NORCO/VICODIN) 5-325 MG tablet, Take 1-2 tablets by mouth every 4 (four) hours as needed., Disp: 10 tablet, Rfl: 0   Multiple Vitamins-Minerals (CENTRUM PO), Take 1 tablet by mouth daily., Disp: , Rfl:    nitroGLYCERIN (NITROSTAT) 0.4 MG SL tablet, Place 0.4 mg under the tongue every 5 (five) minutes as needed for chest pain (Call 911 at 3rd dose within 15 minutes)., Disp: , Rfl:    solifenacin (VESICARE) 5 MG tablet, Take 1 tablet (5 mg total) by mouth daily., Disp: 90 tablet, Rfl: 3   spironolactone (ALDACTONE) 25 MG tablet, TAKE 1 TABLET EVERY DAY, Disp: 90 tablet, Rfl: 2   traMADol (ULTRAM) 50 MG tablet, Take 1 tablet (50 mg total) by mouth every 6 (six) hours as needed., Disp: 30 tablet, Rfl: 0   triamcinolone (NASACORT) 55 MCG/ACT AERO nasal inhaler, Place 2 sprays into the nose daily., Disp: 1 Inhaler, Rfl: 12   Objective:     There were no vitals filed for this visit.    There is no height or weight on file to calculate BMI.    Physical Exam:    ***   Electronically signed by:  Aleen Sells  [facility] 7:21 AM 09/04/23

## 2023-09-07 ENCOUNTER — Ambulatory Visit: Payer: Medicare HMO | Admitting: Sports Medicine

## 2023-09-07 VITALS — BP 118/64 | Ht 70.0 in | Wt 157.0 lb

## 2023-09-07 DIAGNOSIS — M25511 Pain in right shoulder: Secondary | ICD-10-CM | POA: Diagnosis not present

## 2023-09-07 DIAGNOSIS — G8929 Other chronic pain: Secondary | ICD-10-CM

## 2023-09-07 DIAGNOSIS — M19011 Primary osteoarthritis, right shoulder: Secondary | ICD-10-CM | POA: Diagnosis not present

## 2023-09-25 ENCOUNTER — Telehealth: Payer: Self-pay | Admitting: Internal Medicine

## 2023-09-25 NOTE — Telephone Encounter (Signed)
 Patient dropped off form for Dr. Autry Legions to fill out - please call when completed - paper was put in Dr. Koleen Perna box

## 2023-09-29 DIAGNOSIS — R040 Epistaxis: Secondary | ICD-10-CM | POA: Diagnosis not present

## 2023-10-05 NOTE — Telephone Encounter (Signed)
I haven't seen this form

## 2023-10-08 NOTE — Telephone Encounter (Signed)
Called and let Pt know Form has been completed and ready for pickup.

## 2023-10-12 ENCOUNTER — Other Ambulatory Visit: Payer: Self-pay

## 2023-10-12 ENCOUNTER — Other Ambulatory Visit: Payer: Self-pay | Admitting: Internal Medicine

## 2023-10-15 DIAGNOSIS — R040 Epistaxis: Secondary | ICD-10-CM | POA: Diagnosis not present

## 2023-10-15 DIAGNOSIS — Z0100 Encounter for examination of eyes and vision without abnormal findings: Secondary | ICD-10-CM | POA: Diagnosis not present

## 2023-11-11 ENCOUNTER — Encounter: Payer: Self-pay | Admitting: Internal Medicine

## 2023-11-11 ENCOUNTER — Ambulatory Visit (INDEPENDENT_AMBULATORY_CARE_PROVIDER_SITE_OTHER): Payer: Medicare HMO | Admitting: Internal Medicine

## 2023-11-11 VITALS — BP 110/62 | HR 60 | Temp 97.9°F | Ht 70.0 in | Wt 154.0 lb

## 2023-11-11 DIAGNOSIS — E7800 Pure hypercholesterolemia, unspecified: Secondary | ICD-10-CM | POA: Diagnosis not present

## 2023-11-11 DIAGNOSIS — Z0000 Encounter for general adult medical examination without abnormal findings: Secondary | ICD-10-CM

## 2023-11-11 DIAGNOSIS — R7302 Impaired glucose tolerance (oral): Secondary | ICD-10-CM

## 2023-11-11 DIAGNOSIS — I10 Essential (primary) hypertension: Secondary | ICD-10-CM

## 2023-11-11 DIAGNOSIS — Z0001 Encounter for general adult medical examination with abnormal findings: Secondary | ICD-10-CM

## 2023-11-11 DIAGNOSIS — I5022 Chronic systolic (congestive) heart failure: Secondary | ICD-10-CM | POA: Diagnosis not present

## 2023-11-11 DIAGNOSIS — J449 Chronic obstructive pulmonary disease, unspecified: Secondary | ICD-10-CM

## 2023-11-11 DIAGNOSIS — E559 Vitamin D deficiency, unspecified: Secondary | ICD-10-CM

## 2023-11-11 NOTE — Assessment & Plan Note (Signed)
Last vitamin D Lab Results  Component Value Date   VD25OH 22.35 (L) 05/14/2023   Low, to start oral replacement

## 2023-11-11 NOTE — Assessment & Plan Note (Signed)
 Lab Results  Component Value Date   HGBA1C 6.2 08/17/2023   Stable, pt to continue current medical treatment  - diet,wt control

## 2023-11-11 NOTE — Assessment & Plan Note (Signed)
 BP Readings from Last 3 Encounters:  11/11/23 110/62  09/07/23 118/64  08/17/23 118/64   Stable, pt to continue medical treatment coreg 12.5 bid

## 2023-11-11 NOTE — Assessment & Plan Note (Signed)
 Stable volume, o/w cont current med tx

## 2023-11-11 NOTE — Assessment & Plan Note (Signed)
 Lab Results  Component Value Date   LDLCALC 64 08/17/2023   Stable, pt to continue current statin lipitor 80 mg qd

## 2023-11-11 NOTE — Assessment & Plan Note (Signed)
 Stable, cont inhaler as needed

## 2023-11-11 NOTE — Progress Notes (Signed)
 Patient ID: Gary Levy, male   DOB: Jun 26, 1941, 83 y.o.   MRN: [id number]         Chief Complaint:: wellness exam and ckd3a, copd, chf, hyperglycemia, hld, htn       HPI:  Gary Levy is a 83 y.o. male here for wellness exam; for tdap and shingrix at pharmacy, o/w up to date                        Also has seen renal with kidney u/s with stable results.  Has seen ENT with resolved nosebleeds.    Pt denies chest pain, increased sob or doe, wheezing, orthopnea, PND, increased LE swelling, palpitations, dizziness or syncope.   Pt denies polydipsia, polyuria, or new focal neuro s/s.    Pt denies fever, wt loss, night sweats, loss of appetite, or other constitutional symptoms     Wt Readings from Last 3 Encounters:  11/11/23 154 lb (69.9 kg)  09/07/23 157 lb (71.2 kg)  08/17/23 153 lb (69.4 kg)   BP Readings from Last 3 Encounters:  11/11/23 110/62  09/07/23 118/64  08/17/23 118/64   Immunization History  Administered Date(s) Administered   Fluad Quad(high Dose 65+) 04/15/2019, 06/02/2020, 04/29/2021, 04/29/2022   Fluad Trivalent(High Dose 65+) 05/14/2023   Influenza Split 05/05/2012   Influenza Whole 08/15/2009, 06/11/2010   Influenza, High Dose Seasonal PF 06/07/2013, 04/12/2015, 07/01/2016, 04/14/2017, 04/16/2018   Influenza,inj,Quad PF,6+ Mos 04/11/2014   PFIZER(Purple Top)SARS-COV-2 Vaccination 09/23/2019, 10/14/2019, 05/28/2020   Pneumococcal Conjugate-13 10/12/2013   Pneumococcal Polysaccharide-23 08/19/1995, 08/15/2009   Td 08/19/1995, 08/15/2009   Health Maintenance Due  Topic Date Due   Zoster Vaccines- Shingrix (1 of 2) Never done   DTaP/Tdap/Td (3 - Tdap) 08/16/2019      Past Medical History:  Diagnosis Date   Alcohol abuse    hx   Anemia, iron deficiency    AR (allergic rhinitis)    BPH (benign prostatic hypertrophy)    CAD (coronary artery disease) of artery bypass graft    CHF (congestive heart failure) (HCC)    CKD (chronic kidney disease) 09/30/2011    COPD (chronic obstructive pulmonary disease) (HCC)    Crohn's disease (HCC)    Dysuria    ED (erectile dysfunction) of organic origin    Fatigue    Glaucoma    Glucose intolerance (impaired glucose tolerance)    HLD (hyperlipidemia)    HTN (hypertension)    Impaired glucose tolerance 09/27/2011   MI (myocardial infarction) (HCC)    hx   Moderate or severe vision impairment, both eyes, impairment level not further specified    Postherpetic neuralgia    PSA elevation    Routine general medical examination at a health care facility    Shingles    Skin cancer    hx; non-melanoma   Special screening for malignant neoplasm of prostate    Urinary frequency    Past Surgical History:  Procedure Laterality Date   CORONARY ANGIOPLASTY WITH STENT PLACEMENT     CORONARY ARTERY BYPASS GRAFT     11+ years ago; has an EF of about 45%, last Myoview showed no ischemia 12/30/06   [facility]  2011   Dr. Rayburn Ma    right cataract Bilateral 07/2010   with lens implant     reports that he quit smoking about 42 years ago. His smoking use included cigarettes. He has never used smokeless tobacco. He reports that he does not drink alcohol and does not  use drugs. family history includes Heart disease in his mother. No Known Allergies Current Outpatient Medications on File Prior to Visit  Medication Sig Dispense Refill   Alcohol Swabs (B-D SINGLE USE SWABS REGULAR) PADS Use as directed twice daily E11.9 200 each 3   allopurinol (ZYLOPRIM) 100 MG tablet TAKE 1 TABLET EVERY DAY 90 tablet 3   aspirin 81 MG EC tablet Take 81 mg by mouth daily.     atorvastatin (LIPITOR) 80 MG tablet TAKE 1 TABLET EVERY DAY AT 6:00 PM 90 tablet 3   Blood Glucose Calibration (TRUE METRIX LEVEL 1) Low SOLN Use as directed once daily E11.9 3 each 3   blood glucose meter kit and supplies KIT Use daily to check blood sugar levels. 1 each 0   carvedilol (COREG) 12.5 MG tablet TAKE 1 TABLET TWICE DAILY WITH MEALS 180 tablet 3   colchicine  0.6 MG tablet Take 1 tablet (0.6 mg total) by mouth daily. 60 tablet 5   ezetimibe (ZETIA) 10 MG tablet TAKE 1 TABLET EVERY DAY 90 tablet 2   HYDROcodone-acetaminophen (NORCO/VICODIN) 5-325 MG tablet Take 1-2 tablets by mouth every 4 (four) hours as needed. 10 tablet 0   Multiple Vitamins-Minerals (CENTRUM PO) Take 1 tablet by mouth daily.     nitroGLYCERIN (NITROSTAT) 0.4 MG SL tablet Place 0.4 mg under the tongue every 5 (five) minutes as needed for chest pain (Call 911 at 3rd dose within 15 minutes).     solifenacin (VESICARE) 5 MG tablet TAKE 1 TABLET (5 MG TOTAL) BY MOUTH DAILY. 90 tablet 3   spironolactone (ALDACTONE) 25 MG tablet TAKE 1 TABLET EVERY DAY 90 tablet 2   traMADol (ULTRAM) 50 MG tablet Take 1 tablet (50 mg total) by mouth every 6 (six) hours as needed. 30 tablet 0   triamcinolone (NASACORT) 55 MCG/ACT AERO nasal inhaler Place 2 sprays into the nose daily. 1 Inhaler 12   cetirizine (ZYRTEC) 10 MG tablet Take 1 tablet (10 mg total) by mouth daily. 30 tablet 11   No current facility-administered medications on file prior to visit.        ROS:  All others reviewed and negative.  Objective        PE:  BP 110/62   Pulse 60   Temp 97.9 F (36.6 C) (Temporal)   Ht 5\' 10"  (1.778 m)   Wt 154 lb (69.9 kg)   SpO2 96%   BMI 22.10 kg/m                 Constitutional: Pt appears in NAD               HENT: Head: NCAT.                Right Ear: External ear normal.                 Left Ear: External ear normal.                Eyes: . Pupils are equal, round, and reactive to light. Conjunctivae and EOM are normal               Nose: without d/c or deformity               Neck: Neck supple. Gross normal ROM               Cardiovascular: Normal rate and regular rhythm.  Pulmonary/Chest: Effort normal and breath sounds without rales or wheezing.                Abd:  Soft, NT, ND, + BS, no organomegaly               Neurological: Pt is alert. At baseline orientation,  motor grossly intact               Skin: Skin is warm. No rashes, no other new lesions, LE edema - none               Psychiatric: Pt behavior is normal without agitation   Micro: none  Cardiac tracings I have personally interpreted today:  none  Pertinent Radiological findings (summarize): none   Lab Results  Component Value Date   WBC 4.7 08/17/2023   HGB 11.4 (L) 08/17/2023   HCT 35.1 (L) 08/17/2023   PLT 244.0 08/17/2023   GLUCOSE 92 08/17/2023   CHOL 109 08/17/2023   TRIG 47.0 08/17/2023   HDL 35.80 (L) 08/17/2023   LDLCALC 64 08/17/2023   ALT 20 08/17/2023   AST 26 08/17/2023   NA 139 08/17/2023   K 4.8 08/17/2023   CL 106 08/17/2023   CREATININE 1.83 (H) 08/17/2023   BUN 27 (H) 08/17/2023   CO2 25 08/17/2023   TSH 4.51 10/23/2022   PSA 4.93 (H) 04/29/2021   HGBA1C 6.2 08/17/2023   MICROALBUR 1.2 10/23/2022   Assessment/Plan:  Graylen Noboa is a 83 y.o. Black or African American [2] male with  has a past medical history of Alcohol abuse, Anemia, iron deficiency, AR (allergic rhinitis), BPH (benign prostatic hypertrophy), CAD (coronary artery disease) of artery bypass graft, CHF (congestive heart failure) (HCC), CKD (chronic kidney disease) (09/30/2011), COPD (chronic obstructive pulmonary disease) (HCC), Crohn's disease (HCC), Dysuria, ED (erectile dysfunction) of organic origin, Fatigue, Glaucoma, Glucose intolerance (impaired glucose tolerance), HLD (hyperlipidemia), HTN (hypertension), Impaired glucose tolerance (09/27/2011), MI (myocardial infarction) (HCC), Moderate or severe vision impairment, both eyes, impairment level not further specified, Postherpetic neuralgia, PSA elevation, Routine general medical examination at a health care facility, Shingles, Skin cancer, Special screening for malignant neoplasm of prostate, and Urinary frequency.  Encounter for well adult exam with abnormal findings Age and sex appropriate education and counseling updated with regular  exercise and diet Referrals for preventative services - none needed Immunizations addressed - for tdap and shingrix at pharmacy Smoking counseling  - none needed Evidence for depression or other mood disorder - none significant Most recent labs reviewed. I have personally reviewed and have noted: 1) the patient's medical and social history 2) The patient's current medications and supplements 3) The patient's height, weight, and BMI have been recorded in the chart   COPD (chronic obstructive pulmonary disease) (HCC) Stable, cont inhaler as needed  Chronic systolic CHF (congestive heart failure) (HCC) Stable volume, o/w cont current med tx  Vitamin D deficiency Last vitamin D Lab Results  Component Value Date   VD25OH 22.35 (L) 05/14/2023   Low, to start oral replacement   Impaired glucose tolerance Lab Results  Component Value Date   HGBA1C 6.2 08/17/2023   Stable, pt to continue current medical treatment  - diet,wt control   Hyperlipidemia Lab Results  Component Value Date   LDLCALC 64 08/17/2023   Stable, pt to continue current statin lipitor 80 mg qd   Essential hypertension BP Readings from Last 3 Encounters:  11/11/23 110/62  09/07/23 118/64  08/17/23 118/64   Stable, pt to  continue medical treatment coreg 12.5 bid  Followup: Return in about 6 months (around 05/13/2024).  Oliver Barre, MD 11/11/2023 8:37 AM [facility]

## 2023-11-11 NOTE — Patient Instructions (Addendum)
 Please have your Shingrix (shingles) shots done at your local pharmacy., and the Tdap tetanus shot  Please remember to call kidney doctor office, to make sure about the next appt that should be about May 2025  Please continue all other medications as before, and refills have been done if requested.  Please have the pharmacy call with any other refills you may need.  Please continue your efforts at being more active, low cholesterol diet, and weight control.  You are otherwise up to date with prevention measures today.  Please keep your appointments with your specialists as you may have planned  We can hold on more lab testing today  Please make an Appointment to return in 6 months, or sooner if needed

## 2023-11-11 NOTE — Assessment & Plan Note (Signed)

## 2023-11-16 ENCOUNTER — Other Ambulatory Visit: Payer: Self-pay | Admitting: Internal Medicine

## 2023-11-16 ENCOUNTER — Other Ambulatory Visit: Payer: Self-pay

## 2023-11-18 ENCOUNTER — Other Ambulatory Visit: Payer: Self-pay | Admitting: Internal Medicine

## 2023-11-19 ENCOUNTER — Other Ambulatory Visit: Payer: Self-pay

## 2023-12-16 ENCOUNTER — Encounter (HOSPITAL_COMMUNITY): Payer: Self-pay | Admitting: *Deleted

## 2023-12-16 ENCOUNTER — Emergency Department (HOSPITAL_COMMUNITY)
Admission: EM | Admit: 2023-12-16 | Discharge: 2023-12-16 | Disposition: A | Discharge status: Home / Self Care | Attending: Emergency Medicine | Admitting: Emergency Medicine

## 2023-12-16 ENCOUNTER — Other Ambulatory Visit: Payer: Self-pay

## 2023-12-16 DIAGNOSIS — R040 Epistaxis: Secondary | ICD-10-CM | POA: Insufficient documentation

## 2023-12-16 LAB — CBC WITH DIFFERENTIAL/PLATELET
Abs Immature Granulocytes: 0.01 10*3/uL (ref 0.00–0.07)
Basophils Absolute: 0.1 10*3/uL (ref 0.0–0.1)
Basophils Relative: 1 %
Eosinophils Absolute: 0.2 10*3/uL (ref 0.0–0.5)
Eosinophils Relative: 4 %
HCT: 33.3 % — ABNORMAL LOW (ref 39.0–52.0)
Hemoglobin: 10.7 g/dL — ABNORMAL LOW (ref 13.0–17.0)
Immature Granulocytes: 0 %
Lymphocytes Relative: 26 %
Lymphs Abs: 1.2 10*3/uL (ref 0.7–4.0)
MCH: 27.6 pg (ref 26.0–34.0)
MCHC: 32.1 g/dL (ref 30.0–36.0)
MCV: 85.8 fL (ref 80.0–100.0)
Monocytes Absolute: 0.6 10*3/uL (ref 0.1–1.0)
Monocytes Relative: 13 %
Neutro Abs: 2.7 10*3/uL (ref 1.7–7.7)
Neutrophils Relative %: 56 %
Platelets: 188 10*3/uL (ref 150–400)
RBC: 3.88 MIL/uL — ABNORMAL LOW (ref 4.22–5.81)
RDW: 15.7 % — ABNORMAL HIGH (ref 11.5–15.5)
WBC: 4.8 10*3/uL (ref 4.0–10.5)
nRBC: 0 % (ref 0.0–0.2)

## 2023-12-16 LAB — BASIC METABOLIC PANEL WITH GFR
Anion gap: 8 (ref 5–15)
BUN: 23 mg/dL (ref 8–23)
CO2: 24 mmol/L (ref 22–32)
Calcium: 9.6 mg/dL (ref 8.9–10.3)
Chloride: 105 mmol/L (ref 98–111)
Creatinine, Ser: 1.89 mg/dL — ABNORMAL HIGH (ref 0.61–1.24)
GFR, Estimated: 35 mL/min — ABNORMAL LOW (ref >60)
Glucose, Bld: 89 mg/dL (ref 70–99)
Potassium: 4.8 mmol/L (ref 3.5–5.1)
Sodium: 137 mmol/L (ref 135–145)

## 2023-12-16 LAB — PROTIME-INR
INR: 1.1 (ref 0.8–1.2)
Prothrombin Time: 14.3 s (ref 11.4–15.2)

## 2023-12-16 MED ORDER — OXYMETAZOLINE HCL 0.05 % NA SOLN
1.0000 | Freq: Once | NASAL | Status: AC
  Administered 2023-12-16: 1 via NASAL

## 2023-12-16 NOTE — ED Notes (Signed)
 Patient alert, oriented, and ambulatory at time of discharge. Patient verbalized an understanding of discharge instructions and left with wife without assistance or c/o pain or discomfort.

## 2023-12-16 NOTE — ED Triage Notes (Signed)
 Nosebleed approx 45 minutes he has a history of the same  no warning today  blood coming down the back of his throat

## 2023-12-16 NOTE — ED Provider Notes (Signed)
 [facility] EM[facility] AT [facility] Provider Note   CSN: [id number] Arrival date & time: 12/16/23  2004     History Chief Complaint  Patient presents with   Epistaxis    HPI Gary Levy is a 83 y.o. male presenting for chief complaint epistaxis.  States he has a history of severe epistaxis. He was at a restaurant when he felt sudden onset epistaxis from his left naris.  Denies fevers chills nausea vomiting shortness of breath.  Patient's recorded medical, surgical, social, medication list and allergies were reviewed in the Snapshot window as part of the initial history.   Review of Systems   Review of Systems  Constitutional:  Negative for chills and fever.  HENT:  Positive for nosebleeds. Negative for ear pain, mouth sores and sore throat.   Eyes:  Negative for pain and visual disturbance.  Respiratory:  Negative for cough and shortness of breath.   Cardiovascular:  Negative for chest pain and palpitations.  Gastrointestinal:  Negative for abdominal pain and vomiting.  Genitourinary:  Negative for dysuria and hematuria.  Musculoskeletal:  Negative for arthralgias and back pain.  Skin:  Negative for color change and rash.  Neurological:  Negative for seizures and syncope.  All other systems reviewed and are negative.   Physical Exam Updated Vital Signs BP (!) 162/88 (BP Location: Right Arm)   Pulse (!) 117   Temp 97.8 F (36.6 C) (Oral)   Resp 20   Ht 5\' 10"  (1.778 m)   Wt 69.9 kg   SpO2 99%   BMI 22.11 kg/m  Physical Exam Vitals and nursing note reviewed.  Constitutional:      General: He is not in acute distress.    Appearance: He is well-developed.  HENT:     Head: Normocephalic and atraumatic.  Eyes:     Conjunctiva/sclera: Conjunctivae normal.  Cardiovascular:     Rate and Rhythm: Normal rate and regular rhythm.     Heart sounds: No murmur heard. Pulmonary:     Effort: Pulmonary effort is normal. No respiratory distress.      Breath sounds: Normal breath sounds.  Abdominal:     Palpations: Abdomen is soft.     Tenderness: There is no abdominal tenderness.  Musculoskeletal:        General: No swelling.     Cervical back: Neck supple.  Skin:    General: Skin is warm and dry.     Capillary Refill: Capillary refill takes less than 2 seconds.  Neurological:     Mental Status: He is alert.  Psychiatric:        Mood and Affect: Mood normal.      ED Course/ Medical Decision Making/ A&P    Procedures Procedures   Medications Ordered in ED Medications  oxymetazoline  (AFRIN) 0.05 % nasal spray 1 spray (1 spray Each Nare Given 12/16/23 2038)    Medical Decision Making:   Gary Levy is a 83 y.o. male who presented to the ED today with epistaxis detailed above.    Complete initial physical exam performed, notably the patient  was hemodynamically stable no acute distress.    Reviewed and confirmed nursing documentation for past medical history, family history, social history.    Initial Assessment:   Patient's epistaxis resolved with Afrin in triage. Blood work/coagulation studies performed to evaluate for emergent pathology and grossly negative.  Patient feels comfortable outpatient care management.  Educated on treatment of nosebleeds and strict return precautions reinforced.  No evidence  of posterior sphenopalatine artery bleed.  Likely anterior etiology given his history of similar.  Disposition:  I have considered need for hospitalization, however, considering all of the above, I believe this patient is stable for discharge at this time.  Patient/family educated about specific return precautions for given chief complaint and symptoms.  Patient/family educated about follow-up with PCP.     Patient/family expressed understanding of return precautions and need for follow-up. Patient spoken to regarding all imaging and laboratory results and appropriate follow up for these results. All education provided  in verbal form with additional information in written form. Time was allowed for answering of patient questions. Patient discharged.    Emergency Department Medication Summary:   Medications  oxymetazoline  (AFRIN) 0.05 % nasal spray 1 spray (1 spray Each Nare Given 12/16/23 2038)        Clinical Impression:  1. Epistaxis      Discharge   Final Clinical Impression(s) / ED Diagnoses Final diagnoses:  Epistaxis    Rx / DC Orders ED Discharge Orders     None         Onetha Bile, MD 12/16/23 2238

## 2023-12-17 ENCOUNTER — Telehealth: Payer: Self-pay

## 2023-12-17 NOTE — Transitions of Care (Post Inpatient/ED Visit) (Signed)
   12/17/2023  Name: Gary Levy MRN: [id number] DOB: June 06, 1941  Today's TOC FU Call Status: Today's TOC FU Call Status:: Unsuccessful Call (1st Attempt) Unsuccessful Call (1st Attempt) Date: 12/17/23  Attempted to reach the patient regarding the most recent Inpatient/ED visit.  Follow Up Plan: Additional outreach attempts will be made to reach the patient to complete the Transitions of Care (Post Inpatient/ED visit) call.   Signature Darrall Ellison, LPN [facility] Nurse Health Advisor Direct Dial [phone]

## 2023-12-18 ENCOUNTER — Encounter: Payer: Self-pay | Admitting: Internal Medicine

## 2023-12-18 ENCOUNTER — Ambulatory Visit (INDEPENDENT_AMBULATORY_CARE_PROVIDER_SITE_OTHER): Admitting: Internal Medicine

## 2023-12-18 VITALS — BP 120/84 | HR 52 | Temp 98.0°F | Ht 70.0 in | Wt 154.0 lb

## 2023-12-18 DIAGNOSIS — N1832 Chronic kidney disease, stage 3b: Secondary | ICD-10-CM

## 2023-12-18 DIAGNOSIS — I10 Essential (primary) hypertension: Secondary | ICD-10-CM | POA: Diagnosis not present

## 2023-12-18 DIAGNOSIS — N400 Enlarged prostate without lower urinary tract symptoms: Secondary | ICD-10-CM

## 2023-12-18 DIAGNOSIS — R040 Epistaxis: Secondary | ICD-10-CM | POA: Diagnosis not present

## 2023-12-18 DIAGNOSIS — J309 Allergic rhinitis, unspecified: Secondary | ICD-10-CM

## 2023-12-18 MED ORDER — MONTELUKAST SODIUM 10 MG PO TABS: 10.0000 mg | ORAL_TABLET | Freq: Every day | ORAL | 3 refills | Status: AC

## 2023-12-18 NOTE — Progress Notes (Unsigned)
 Patient ID: Eaton Dicristina, male   DOB: Jul 06, 1941, 84 y.o.   MRN: [id number]        Chief Complaint: follow up recent epitstaxis       HPI:  Danil Depena is a 83 y.o. male here with c/o        Wt Readings from Last 3 Encounters:  12/18/23 154 lb (69.9 kg)  12/16/23 154 lb 1.6 oz (69.9 kg)  11/11/23 154 lb (69.9 kg)   BP Readings from Last 3 Encounters:  12/18/23 120/84  12/16/23 (!) 162/88  11/11/23 110/62         Past Medical History:  Diagnosis Date   Alcohol abuse    hx   Anemia, iron deficiency    AR (allergic rhinitis)    BPH (benign prostatic hypertrophy)    CAD (coronary artery disease) of artery bypass graft    CHF (congestive heart failure) (HCC)    CKD (chronic kidney disease) 09/30/2011   COPD (chronic obstructive pulmonary disease) (HCC)    Crohn's disease (HCC)    Dysuria    ED (erectile dysfunction) of organic origin    Fatigue    Glaucoma    Glucose intolerance (impaired glucose tolerance)    HLD (hyperlipidemia)    HTN (hypertension)    Impaired glucose tolerance 09/27/2011   MI (myocardial infarction) (HCC)    hx   Moderate or severe vision impairment, both eyes, impairment level not further specified    Postherpetic neuralgia    PSA elevation    Routine general medical examination at a health care facility    Shingles    Skin cancer    hx; non-melanoma   Special screening for malignant neoplasm of prostate    Urinary frequency    Past Surgical History:  Procedure Laterality Date   CORONARY ANGIOPLASTY WITH STENT PLACEMENT     CORONARY ARTERY BYPASS GRAFT     11+ years ago; has an EF of about 45%, last Myoview  showed no ischemia 12/30/06   [facility]  2011   Dr. Asenath Blacker    right cataract Bilateral 07/2010   with lens implant     reports that he quit smoking about 42 years ago. His smoking use included cigarettes. He has never used smokeless tobacco. He reports that he does not drink alcohol and does not use drugs. family history includes Heart  disease in his mother. No Known Allergies Current Outpatient Medications on File Prior to Visit  Medication Sig Dispense Refill   Alcohol Swabs (B-D SINGLE USE SWABS REGULAR) PADS Use as directed twice daily E11.9 200 each 3   allopurinol  (ZYLOPRIM ) 100 MG tablet TAKE 1 TABLET EVERY DAY 90 tablet 3   aspirin 81 MG EC tablet Take 81 mg by mouth daily.     atorvastatin  (LIPITOR) 80 MG tablet TAKE 1 TABLET EVERY DAY AT 6:00 PM 90 tablet 3   Blood Glucose Calibration (TRUE METRIX LEVEL 1) Low SOLN Use as directed once daily E11.9 3 each 3   blood glucose meter kit and supplies KIT Use daily to check blood sugar levels. 1 each 0   carvedilol  (COREG ) 12.5 MG tablet TAKE 1 TABLET TWICE DAILY WITH MEALS 180 tablet 3   colchicine  0.6 MG tablet TAKE 1 TABLET EVERY DAY 90 tablet 3   ezetimibe  (ZETIA ) 10 MG tablet TAKE 1 TABLET EVERY DAY 90 tablet 2   HYDROcodone -acetaminophen  (NORCO/VICODIN) 5-325 MG tablet Take 1-2 tablets by mouth every 4 (four) hours as needed. 10 tablet 0  Multiple Vitamins-Minerals (CENTRUM PO) Take 1 tablet by mouth daily.     nitroGLYCERIN  (NITROSTAT ) 0.4 MG SL tablet Place 0.4 mg under the tongue every 5 (five) minutes as needed for chest pain (Call 911 at 3rd dose within 15 minutes).     solifenacin  (VESICARE ) 5 MG tablet TAKE 1 TABLET (5 MG TOTAL) BY MOUTH DAILY. 90 tablet 3   spironolactone  (ALDACTONE ) 25 MG tablet TAKE 1 TABLET EVERY DAY 90 tablet 3   traMADol  (ULTRAM ) 50 MG tablet Take 1 tablet (50 mg total) by mouth every 6 (six) hours as needed. 30 tablet 0   triamcinolone  (NASACORT ) 55 MCG/ACT AERO nasal inhaler Place 2 sprays into the nose daily. 1 Inhaler 12   cetirizine  (ZYRTEC ) 10 MG tablet Take 1 tablet (10 mg total) by mouth daily. 30 tablet 11   No current facility-administered medications on file prior to visit.        ROS:  All others reviewed and negative.  Objective        PE:  BP 120/84 (BP Location: Right Arm, Patient Position: Sitting, Cuff Size:  Normal)   Pulse (!) 52   Temp 98 F (36.7 C) (Oral)   Ht 5\' 10"  (1.778 m)   Wt 154 lb (69.9 kg)   SpO2 98%   BMI 22.10 kg/m                 Constitutional: Pt appears in NAD               HENT: Head: NCAT.                Right Ear: External ear normal.                 Left Ear: External ear normal.                Eyes: . Pupils are equal, round, and reactive to light. Conjunctivae and EOM are normal               Nose: without d/c or deformity               Neck: Neck supple. Gross normal ROM               Cardiovascular: Normal rate and regular rhythm.                 Pulmonary/Chest: Effort normal and breath sounds without rales or wheezing.                Abd:  Soft, NT, ND, + BS, no organomegaly               Neurological: Pt is alert. At baseline orientation, motor grossly intact               Skin: Skin is warm. No rashes, no other new lesions, LE edema - ***               Psychiatric: Pt behavior is normal without agitation   Micro: none  Cardiac tracings I have personally interpreted today:  none  Pertinent Radiological findings (summarize): none   Lab Results  Component Value Date   WBC 4.8 12/16/2023   HGB 10.7 (L) 12/16/2023   HCT 33.3 (L) 12/16/2023   PLT 188 12/16/2023   GLUCOSE 89 12/16/2023   CHOL 109 08/17/2023   TRIG 47.0 08/17/2023   HDL 35.80 (L) 08/17/2023   LDLCALC 64 08/17/2023   ALT 20 08/17/2023  AST 26 08/17/2023   NA 137 12/16/2023   K 4.8 12/16/2023   CL 105 12/16/2023   CREATININE 1.89 (H) 12/16/2023   BUN 23 12/16/2023   CO2 24 12/16/2023   TSH 4.51 10/23/2022   PSA 4.93 (H) 04/29/2021   INR 1.1 12/16/2023   HGBA1C 6.2 08/17/2023   MICROALBUR 1.2 10/23/2022   Assessment/Plan:  Jaetyn Wiggans is a 83 y.o. Black or African American [2] male with  has a past medical history of Alcohol abuse, Anemia, iron deficiency, AR (allergic rhinitis), BPH (benign prostatic hypertrophy), CAD (coronary artery disease) of artery bypass graft, CHF  (congestive heart failure) (HCC), CKD (chronic kidney disease) (09/30/2011), COPD (chronic obstructive pulmonary disease) (HCC), Crohn's disease (HCC), Dysuria, ED (erectile dysfunction) of organic origin, Fatigue, Glaucoma, Glucose intolerance (impaired glucose tolerance), HLD (hyperlipidemia), HTN (hypertension), Impaired glucose tolerance (09/27/2011), MI (myocardial infarction) (HCC), Moderate or severe vision impairment, both eyes, impairment level not further specified, Postherpetic neuralgia, PSA elevation, Routine general medical examination at a health care facility, Shingles, Skin cancer, Special screening for malignant neoplasm of prostate, and Urinary frequency.  No problem-specific Assessment & Plan notes found for this encounter.  Followup: No follow-ups on file.  Rosalia Colonel, MD 12/18/2023 8:48 AM [facility] - [facility] Internal Medicine

## 2023-12-18 NOTE — Transitions of Care (Post Inpatient/ED Visit) (Signed)
   12/18/2023  Name: Gary Levy MRN: [id number] DOB: 08/16/1941  Today's TOC FU Call Status: Today's TOC FU Call Status:: Unsuccessful Call (1st Attempt) Unsuccessful Call (1st Attempt) Date: 12/17/23  Attempted to reach the patient regarding the most recent Inpatient/ED visit.  Follow Up Plan: No further outreach attempts will be made at this time. We have been unable to contact the patient. Patient already seen in office Signature Darrall Ellison, LPN [facility] Nurse Health Advisor Direct Dial [phone]

## 2023-12-18 NOTE — Patient Instructions (Signed)
 Ok to take the afrin as needed  Please follow up with Dr Tellis Feathers ENT for any further nosebleeds  Please take all new medication as prescribed  - the singulair 10 mg for allergies and congestion  Please continue all other medications as before, and refills have been done if requested.  Please have the pharmacy call with any other refills you may need.  Please keep your appointments with your specialists as you may have planned - cardiology, and renal  Please make an Appointment to return in about 6 months as you mentioned, or sooner if needed

## 2023-12-19 ENCOUNTER — Encounter: Payer: Self-pay | Admitting: Internal Medicine

## 2023-12-19 NOTE — Assessment & Plan Note (Signed)
 Lab Results  Component Value Date   CREATININE 1.89 (H) 12/16/2023   Stable overall, cont to avoid nephrotoxins

## 2023-12-19 NOTE — Assessment & Plan Note (Signed)
 With mild worsening nocturia, o/w doing ok and pt declines need for urology referral

## 2023-12-19 NOTE — Assessment & Plan Note (Signed)
 Recurrent, ok for cont asa 81 mg for now, pt declines ENT referral for now

## 2023-12-19 NOTE — Assessment & Plan Note (Signed)
 Mild to mod, for singulair 10 mg every day, ok for Nasacort  asd as well as should not aggrevate nosebleed (but not flonase  that can),  to f/u any worsening symptoms or concerns

## 2023-12-19 NOTE — Assessment & Plan Note (Signed)
 BP Readings from Last 3 Encounters:  12/18/23 120/84  12/16/23 (!) 162/88  11/11/23 110/62   Stable, pt to continue medical treatment coreg  12.5 bid

## 2023-12-22 DIAGNOSIS — I5022 Chronic systolic (congestive) heart failure: Secondary | ICD-10-CM | POA: Diagnosis not present

## 2023-12-22 DIAGNOSIS — I130 Hypertensive heart and chronic kidney disease with heart failure and stage 1 through stage 4 chronic kidney disease, or unspecified chronic kidney disease: Secondary | ICD-10-CM | POA: Diagnosis not present

## 2023-12-22 DIAGNOSIS — N1832 Chronic kidney disease, stage 3b: Secondary | ICD-10-CM | POA: Diagnosis not present

## 2023-12-22 DIAGNOSIS — I255 Ischemic cardiomyopathy: Secondary | ICD-10-CM | POA: Diagnosis not present

## 2023-12-22 DIAGNOSIS — D509 Iron deficiency anemia, unspecified: Secondary | ICD-10-CM | POA: Diagnosis not present

## 2023-12-22 DIAGNOSIS — N2581 Secondary hyperparathyroidism of renal origin: Secondary | ICD-10-CM | POA: Diagnosis not present

## 2024-03-21 ENCOUNTER — Ambulatory Visit: Payer: Self-pay

## 2024-03-21 NOTE — Telephone Encounter (Signed)
 FYI Only or Action Required?: Action required by provider: request for appointment.  Patient was last seen in primary care on 12/18/2023 by Norleen Lynwood ORN, MD.  Called Nurse Triage reporting Night Sweats.  Symptoms began a week ago.  Interventions attempted: Nothing.  Symptoms are: unchanged. Has had 2 episodes of night sweats in 1 week.No chest psin or other symptom. Has to change his shirt.  Triage Disposition: See Physician Within 24 Hours  Patient/caregiver understands and will follow disposition?: Yes   Copied from CRM #[id number]. Topic: Clinical - Red Word Triage >> Mar 21, 2024  3:34 PM Gary Levy wrote: Red Word that prompted transfer to Nurse Triage: Patient's wife Gary Levy) states this has been going on the past couple of nights where he is sweating so bad his shirt is soaked throughout the night. She would like to speak to a nurse to see if there is anything they would recommend for him to do. Mentioned this worried him because he had a heart attack a couple of years ago and it felt like that with just the sweats though, no pain or other side effects. Answer Assessment - Initial Assessment Questions 1. ONSET: When did the sweating start?      Last week -x 2  2. LOCATION: What part of your body has excessive sweating? (e.g., entire body; just face, underarms, palms, or soles of feet).      Head and neck, chest 3. SEVERITY: How bad is the sweating?    (Scale 1-10; or mild, moderate, severe)     mild 4. CAUSE: What do you think is causing the sweating?     unsure 5. FEVER: Have you been having fevers? If Yes, ask: What is your temperature, how was it measured, and when did it start?     no 6. OTHER SYMPTOMS: Do you have any other symptoms? (e.g., chest pain, difficulty breathing, lightheadedness, weight loss)     no  Protocols used: Sweating-A-AH  Reason for Disposition  [1] NIGHT SWEATS occur (e.g., drenching sweat that occurs at night and has to change bed clothes  or bed sheets) AND [2] cause unknown  Answer Assessment - Initial Assessment Questions 1. ONSET: When did the sweating start?      Last week -x 2  2. LOCATION: What part of your body has excessive sweating? (e.g., entire body; just face, underarms, palms, or soles of feet).      Head and neck, chest 3. SEVERITY: How bad is the sweating?    (Scale 1-10; or mild, moderate, severe)     mild 4. CAUSE: What do you think is causing the sweating?     unsure 5. FEVER: Have you been having fevers? If Yes, ask: What is your temperature, how was it measured, and when did it start?     no 6. OTHER SYMPTOMS: Do you have any other symptoms? (e.g., chest pain, difficulty breathing, lightheadedness, weight loss)     no  Protocols used: Sweating-A-AH

## 2024-03-22 ENCOUNTER — Ambulatory Visit (INDEPENDENT_AMBULATORY_CARE_PROVIDER_SITE_OTHER)

## 2024-03-22 ENCOUNTER — Encounter: Payer: Self-pay | Admitting: Internal Medicine

## 2024-03-22 ENCOUNTER — Ambulatory Visit (INDEPENDENT_AMBULATORY_CARE_PROVIDER_SITE_OTHER): Admitting: Internal Medicine

## 2024-03-22 ENCOUNTER — Ambulatory Visit (INDEPENDENT_AMBULATORY_CARE_PROVIDER_SITE_OTHER): Payer: Medicare HMO

## 2024-03-22 VITALS — BP 118/76 | HR 77 | Ht 70.0 in | Wt 152.6 lb

## 2024-03-22 VITALS — Ht 70.0 in | Wt 152.0 lb

## 2024-03-22 DIAGNOSIS — N1832 Chronic kidney disease, stage 3b: Secondary | ICD-10-CM | POA: Diagnosis not present

## 2024-03-22 DIAGNOSIS — R61 Generalized hyperhidrosis: Secondary | ICD-10-CM | POA: Insufficient documentation

## 2024-03-22 DIAGNOSIS — K219 Gastro-esophageal reflux disease without esophagitis: Secondary | ICD-10-CM | POA: Insufficient documentation

## 2024-03-22 DIAGNOSIS — R079 Chest pain, unspecified: Secondary | ICD-10-CM | POA: Diagnosis not present

## 2024-03-22 DIAGNOSIS — Z0000 Encounter for general adult medical examination without abnormal findings: Secondary | ICD-10-CM

## 2024-03-22 DIAGNOSIS — E559 Vitamin D deficiency, unspecified: Secondary | ICD-10-CM | POA: Diagnosis not present

## 2024-03-22 DIAGNOSIS — D509 Iron deficiency anemia, unspecified: Secondary | ICD-10-CM | POA: Diagnosis not present

## 2024-03-22 DIAGNOSIS — I10 Essential (primary) hypertension: Secondary | ICD-10-CM | POA: Diagnosis not present

## 2024-03-22 DIAGNOSIS — R0602 Shortness of breath: Secondary | ICD-10-CM | POA: Diagnosis not present

## 2024-03-22 DIAGNOSIS — R7302 Impaired glucose tolerance (oral): Secondary | ICD-10-CM

## 2024-03-22 LAB — FERRITIN: Ferritin: 12.5 ng/mL — ABNORMAL LOW (ref 22.0–322.0)

## 2024-03-22 LAB — IBC PANEL
Iron: 64 ug/dL (ref 42–165)
Saturation Ratios: 15.9 % — ABNORMAL LOW (ref 20.0–50.0)
TIBC: 401.8 ug/dL (ref 250.0–450.0)
Transferrin: 287 mg/dL (ref 212.0–360.0)

## 2024-03-22 LAB — CBC WITH DIFFERENTIAL/PLATELET
Basophils Absolute: 0.1 K/uL (ref 0.0–0.1)
Basophils Relative: 1.9 % (ref 0.0–3.0)
Eosinophils Absolute: 0.2 K/uL (ref 0.0–0.7)
Eosinophils Relative: 4.8 % (ref 0.0–5.0)
HCT: 35.7 % — ABNORMAL LOW (ref 39.0–52.0)
Hemoglobin: 11.5 g/dL — ABNORMAL LOW (ref 13.0–17.0)
Lymphocytes Relative: 18.8 % (ref 12.0–46.0)
Lymphs Abs: 0.9 K/uL (ref 0.7–4.0)
MCHC: 32.3 g/dL (ref 30.0–36.0)
MCV: 84.3 fl (ref 78.0–100.0)
Monocytes Absolute: 0.8 K/uL (ref 0.1–1.0)
Monocytes Relative: 17 % — ABNORMAL HIGH (ref 3.0–12.0)
Neutro Abs: 2.7 K/uL (ref 1.4–7.7)
Neutrophils Relative %: 57.5 % (ref 43.0–77.0)
Platelets: 174 K/uL (ref 150.0–400.0)
RBC: 4.23 Mil/uL (ref 4.22–5.81)
RDW: 16.8 % — ABNORMAL HIGH (ref 11.5–15.5)
WBC: 4.7 K/uL (ref 4.0–10.5)

## 2024-03-22 LAB — LIPID PANEL
Cholesterol: 109 mg/dL (ref 0–200)
HDL: 42.3 mg/dL (ref >39.00)
LDL Cholesterol: 58 mg/dL (ref 0–99)
NonHDL: 66.75
Total CHOL/HDL Ratio: 3
Triglycerides: 45 mg/dL (ref 0.0–149.0)
VLDL: 9 mg/dL (ref 0.0–40.0)

## 2024-03-22 LAB — URINALYSIS, ROUTINE W REFLEX MICROSCOPIC
Bilirubin Urine: NEGATIVE
Hgb urine dipstick: NEGATIVE
Ketones, ur: NEGATIVE
Leukocytes,Ua: NEGATIVE
Nitrite: NEGATIVE
RBC / HPF: NONE SEEN (ref 0–?)
Specific Gravity, Urine: 1.01 (ref 1.000–1.030)
Total Protein, Urine: NEGATIVE
Urine Glucose: NEGATIVE
Urobilinogen, UA: 0.2 (ref 0.0–1.0)
pH: 6 (ref 5.0–8.0)

## 2024-03-22 LAB — BASIC METABOLIC PANEL WITH GFR
BUN: 25 mg/dL — ABNORMAL HIGH (ref 6–23)
CO2: 29 meq/L (ref 19–32)
Calcium: 10.4 mg/dL (ref 8.4–10.5)
Chloride: 104 meq/L (ref 96–112)
Creatinine, Ser: 2.01 mg/dL — ABNORMAL HIGH (ref 0.40–1.50)
GFR: 30.13 mL/min — ABNORMAL LOW (ref >60.00)
Glucose, Bld: 100 mg/dL — ABNORMAL HIGH (ref 70–99)
Potassium: 5.1 meq/L (ref 3.5–5.1)
Sodium: 137 meq/L (ref 135–145)

## 2024-03-22 LAB — HEPATIC FUNCTION PANEL
ALT: 21 U/L (ref 0–53)
AST: 29 U/L (ref 0–37)
Albumin: 4.1 g/dL (ref 3.5–5.2)
Alkaline Phosphatase: 65 U/L (ref 39–117)
Bilirubin, Direct: 0.2 mg/dL (ref 0.0–0.3)
Total Bilirubin: 1.2 mg/dL (ref 0.2–1.2)
Total Protein: 7.9 g/dL (ref 6.0–8.3)

## 2024-03-22 LAB — HEMOGLOBIN A1C: Hgb A1c MFr Bld: 6.2 % (ref 4.6–6.5)

## 2024-03-22 MED ORDER — PANTOPRAZOLE SODIUM 40 MG PO TBEC: 40.0000 mg | DELAYED_RELEASE_TABLET | Freq: Every day | ORAL | 3 refills | Status: AC

## 2024-03-22 NOTE — Assessment & Plan Note (Signed)
 Mild to mod, for protonix  40 mg every day,  to f/u any worsening symptoms or concerns

## 2024-03-22 NOTE — Assessment & Plan Note (Signed)
 BP Readings from Last 3 Encounters:  03/22/24 118/76  12/18/23 120/84  12/16/23 (!) 162/88   Stable, pt to continue medical treatment coreg  12.5 bid, aldcatone 25 qd

## 2024-03-22 NOTE — Assessment & Plan Note (Signed)
 With recent recurring right nosebleed - for f/u cbc, iron , declines ENT for now

## 2024-03-22 NOTE — Patient Instructions (Addendum)
 Please continue all other medications as before, and refills have been done if requested.  Please have the pharmacy call with any other refills you may need.  Please continue your efforts at being more active, low cholesterol diet, and weight control.  You are otherwise up to date with prevention measures today.  Please keep your appointments with your specialists as you may have planned  Please go to the XRAY Department in the first floor for the x-ray testing  Please go to the LAB at the blood drawing area for the tests to be done  You will be contacted by phone if any changes need to be made immediately.  Otherwise, you will receive a letter about your results with an explanation, but please check with MyChart first.  Please make an Appointment to return in Nov 3 or sooner if needed

## 2024-03-22 NOTE — Patient Instructions (Addendum)
 [name] , Thank you for taking time out of your busy schedule to complete your Annual Wellness Visit with me. I enjoyed our conversation and look forward to speaking with you again next year. I, as well as your care team,  appreciate your ongoing commitment to your health goals. Please review the following plan we discussed and let me know if I can assist you in the future. Your Game plan/ To Do List    Referrals: If you haven't heard from the office you've been referred to, please reach out to them at the phone provided.   Follow up Visits: We will see or speak with you next year for your Next Medicare AWV with our clinical staff Have you seen your provider in the last 6 months (3 months if uncontrolled diabetes)? Yes  Clinician Recommendations:  Aim for 30 minutes of exercise or brisk walking, 6-8 glasses of water, and 5 servings of fruits and vegetables each day. You are due for a tetanus vaccine and a Shingles vaccine.  You can get these done at your local pharmacy.        This is a list of the screenings recommended for you:  Health Maintenance  Topic Date Due   Zoster (Shingles) Vaccine (1 of 2) Never done   DTaP/Tdap/Td vaccine (3 - Tdap) 08/16/2019   Flu Shot  03/18/2024   Medicare Annual Wellness Visit  05/13/2024   Pneumococcal Vaccine for age over 21  Completed   Hepatitis B Vaccine  Aged Out   HPV Vaccine  Aged Out   Meningitis B Vaccine  Aged Out   COVID-19 Vaccine  Discontinued    Advanced directives: (Copy Requested) Please bring a copy of your health care power of attorney and living will to the office to be added to your chart at your convenience. You can mail to [facility] [address] [place], [place] [address] or email to [email or web address] Advance Care Planning is important because it:  [x]  Makes sure you receive the medical care that is consistent with your values, goals, and preferences  [x]  It provides guidance to your family and  loved ones and reduces their decisional burden about whether or not they are making the right decisions based on your wishes.  Follow the link provided in your after visit summary or read over the paperwork we have mailed to you to help you started getting your Advance Directives in place. If you need assistance in completing these, please reach out to us  so that we can help you!  See attachments for Preventive Care and Fall Prevention Tips.   Managing Pain Without Opioids Opioids are strong medicines used to treat moderate to severe pain. For some people, especially those who have long-term (chronic) pain, opioids may not be the best choice for pain management due to: Side effects like nausea, constipation, and sleepiness. The risk of addiction (opioid use disorder). The longer you take opioids, the greater your risk of addiction. Pain that lasts for more than 3 months is called chronic pain. Managing chronic pain usually requires more than one approach and is often provided by a team of health care providers working together (multidisciplinary approach). Pain management may be done at a pain management center or pain clinic. How to manage pain without the use of opioids Use non-opioid medicines Non-opioid medicines for pain may include: Over-the-counter or prescription non-steroidal anti-inflammatory drugs (NSAIDs). These may be the first medicines used for pain. They work well for muscle  and bone pain, and they reduce swelling. Acetaminophen . This over-the-counter medicine may work well for milder pain but not swelling. Antidepressants. These may be used to treat chronic pain. A certain type of antidepressant (tricyclics) is often used. These medicines are given in lower doses for pain than when used for depression. Anticonvulsants. These are usually used to treat seizures but may also reduce nerve (neuropathic) pain. Muscle relaxants. These relieve pain caused by sudden muscle tightening  (spasms). You may also use a pain medicine that is applied to the skin as a patch, cream, or gel (topical analgesic), such as a numbing medicine. These may cause fewer side effects than medicines taken by mouth. Do certain therapies as directed Some therapies can help with pain management. They include: Physical therapy. You will do exercises to gain strength and flexibility. A physical therapist may teach you exercises to move and stretch parts of your body that are weak, stiff, or painful. You can learn these exercises at physical therapy visits and practice them at home. Physical therapy may also involve: Massage. Heat wraps or applying heat or cold to affected areas. Electrical signals that interrupt pain signals (transcutaneous electrical nerve stimulation, TENS). Weak lasers that reduce pain and swelling (low-level laser therapy). Signals from your body that help you learn to regulate pain (biofeedback). Occupational therapy. This helps you to learn ways to function at home and work with less pain. Recreational therapy. This involves trying new activities or hobbies, such as a physical activity or drawing. Mental health therapy, including: Cognitive behavioral therapy (CBT). This helps you learn coping skills for dealing with pain. Acceptance and commitment therapy (ACT) to change the way you think and react to pain. Relaxation therapies, including muscle relaxation exercises and mindfulness-based stress reduction. Pain management counseling. This may be individual, family, or group counseling.  Receive medical treatments Medical treatments for pain management include: Nerve block injections. These may include a pain blocker and anti-inflammatory medicines. You may have injections: Near the spine to relieve chronic back or neck pain. Into joints to relieve back or joint pain. Into nerve areas that supply a painful area to relieve body pain. Into muscles (trigger point injections) to  relieve some painful muscle conditions. A medical device placed near your spine to help block pain signals and relieve nerve pain or chronic back pain (spinal cord stimulation device). Acupuncture. Follow these instructions at home Medicines Take over-the-counter and prescription medicines only as told by your health care provider. If you are taking pain medicine, ask your health care providers about possible side effects to watch out for. Do not drive or use heavy machinery while taking prescription opioid pain medicine. Lifestyle  Do not use drugs or alcohol to reduce pain. If you drink alcohol, limit how much you have to: 0-1 drink a day for women who are not pregnant. 0-2 drinks a day for men. Know how much alcohol is in a drink. In the [place] one drink equals one 12 oz bottle of beer (355 mL), one 5 oz glass of wine (148 mL), or one 1 oz glass of hard liquor (44 mL). Do not use any products that contain nicotine or tobacco. These products include cigarettes, chewing tobacco, and vaping devices, such as e-cigarettes. If you need help quitting, ask your health care provider. Eat a healthy diet and maintain a healthy weight. Poor diet and excess weight may make pain worse. Eat foods that are high in fiber. These include fresh fruits and vegetables, whole grains, and beans.  Limit foods that are high in fat and processed sugars, such as fried and sweet foods. Exercise regularly. Exercise lowers stress and may help relieve pain. Ask your health care provider what activities and exercises are safe for you. If your health care provider approves, join an exercise class that combines movement and stress reduction. Examples include yoga and tai chi. Get enough sleep. Lack of sleep may make pain worse. Lower stress as much as possible. Practice stress reduction techniques as told by your therapist. General instructions Work with all your pain management providers to find the treatments that work  best for you. You are an important member of your pain management team. There are many things you can do to reduce pain on your own. Consider joining an online or in-person support group for people who have chronic pain. Keep all follow-up visits. This is important. Where to find more information You can find more information about managing pain without opioids from: American Academy of Pain Medicine: painmed.org Institute for Chronic Pain: instituteforchronicpain.org American Chronic Pain Association: theacpa.org Contact a health care provider if: You have side effects from pain medicine. Your pain gets worse or does not get better with treatments or home therapy. You are struggling with anxiety or depression. Summary Many types of pain can be managed without opioids. Chronic pain may respond better to pain management without opioids. Pain is best managed when you and a team of health care providers work together. Pain management without opioids may include non-opioid medicines, medical treatments, physical therapy, mental health therapy, and lifestyle changes. Tell your health care providers if your pain gets worse or is not being managed well enough. This information is not intended to replace advice given to you by your health care provider. Make sure you discuss any questions you have with your health care provider. Document Revised: 11/14/2020 Document Reviewed: 11/14/2020 Elsevier Patient Education  2024 [employer].

## 2024-03-22 NOTE — Assessment & Plan Note (Signed)
Last vitamin D Lab Results  Component Value Date   VD25OH 22.35 (L) 05/14/2023   Low, to start oral replacement

## 2024-03-22 NOTE — Progress Notes (Signed)
 Patient ID: Gary Levy, male   DOB: [birth year], 83 y.o.   MRN: [id number]        Chief Complaint: follow up right nosebleed, anemia, gerd, night sweats       HPI:  Gary Levy is a 83 y.o. male here overall doing ok, but has had small spottings of blood for several days from right nares, has seen ent in past with cautery for left nosebleed.  No other overt bleeding.  Pt denies chest pain, increased sob or doe, wheezing, orthopnea, PND, increased LE swelling, palpitations, dizziness or syncope.   Pt denies polydipsia, polyuria, or new focal neuro s/s.    Pt denies fever, wt loss, loss of appetite, or other constitutional symptoms, except has had recurring night sweats about twice per wk for several weeks.    Wt Readings from Last 3 Encounters:  03/22/24 152 lb 9.6 oz (69.2 kg)  12/18/23 154 lb (69.9 kg)  12/16/23 154 lb 1.6 oz (69.9 kg)   BP Readings from Last 3 Encounters:  03/22/24 118/76  12/18/23 120/84  12/16/23 (!) 162/88         Past Medical History:  Diagnosis Date   Alcohol abuse    hx   Anemia, iron  deficiency    AR (allergic rhinitis)    BPH (benign prostatic hypertrophy)    CAD (coronary artery disease) of artery bypass graft    CHF (congestive heart failure) (HCC)    CKD (chronic kidney disease) 09/30/2011   COPD (chronic obstructive pulmonary disease) (HCC)    Crohn's disease (HCC)    Dysuria    ED (erectile dysfunction) of organic origin    Fatigue    Glaucoma    Glucose intolerance (impaired glucose tolerance)    HLD (hyperlipidemia)    HTN (hypertension)    Impaired glucose tolerance 09/27/2011   MI (myocardial infarction) (HCC)    hx   Moderate or severe vision impairment, both eyes, impairment level not further specified    Postherpetic neuralgia    PSA elevation    Routine general medical examination at a health care facility    Shingles    Skin cancer    hx; non-melanoma   Special screening for malignant neoplasm of prostate    Urinary frequency     Past Surgical History:  Procedure Laterality Date   CORONARY ANGIOPLASTY WITH STENT PLACEMENT     CORONARY ARTERY BYPASS GRAFT     11+ years ago; has an EF of about 45%, last Myoview  showed no ischemia 12/30/06   [facility]  2011   Dr. Althia    right cataract Bilateral 07/2010   with lens implant     reports that he quit smoking about 43 years ago. His smoking use included cigarettes. He has never used smokeless tobacco. He reports that he does not drink alcohol and does not use drugs. family history includes Heart disease in his mother. No Known Allergies Current Outpatient Medications on File Prior to Visit  Medication Sig Dispense Refill   Alcohol Swabs (B-D SINGLE USE SWABS REGULAR) PADS Use as directed twice daily E11.9 200 each 3   allopurinol  (ZYLOPRIM ) 100 MG tablet TAKE 1 TABLET EVERY DAY 90 tablet 3   aspirin 81 MG EC tablet Take 81 mg by mouth daily.     atorvastatin  (LIPITOR) 80 MG tablet TAKE 1 TABLET EVERY DAY AT 6:00 PM 90 tablet 3   Blood Glucose Calibration (TRUE METRIX LEVEL 1) Low SOLN Use as directed once daily E11.9 3 each  3   blood glucose meter kit and supplies KIT Use daily to check blood sugar levels. 1 each 0   carvedilol  (COREG ) 12.5 MG tablet TAKE 1 TABLET TWICE DAILY WITH MEALS 180 tablet 3   cetirizine  (ZYRTEC ) 10 MG tablet Take 1 tablet (10 mg total) by mouth daily. 30 tablet 11   colchicine  0.6 MG tablet TAKE 1 TABLET EVERY DAY 90 tablet 3   ezetimibe  (ZETIA ) 10 MG tablet TAKE 1 TABLET EVERY DAY 90 tablet 2   HYDROcodone -acetaminophen  (NORCO/VICODIN) 5-325 MG tablet Take 1-2 tablets by mouth every 4 (four) hours as needed. 10 tablet 0   montelukast  (SINGULAIR ) 10 MG tablet Take 1 tablet (10 mg total) by mouth daily. 90 tablet 3   Multiple Vitamins-Minerals (CENTRUM PO) Take 1 tablet by mouth daily.     nitroGLYCERIN  (NITROSTAT ) 0.4 MG SL tablet Place 0.4 mg under the tongue every 5 (five) minutes as needed for chest pain (Call 911 at 3rd dose within 15  minutes).     solifenacin  (VESICARE ) 5 MG tablet TAKE 1 TABLET (5 MG TOTAL) BY MOUTH DAILY. 90 tablet 3   spironolactone  (ALDACTONE ) 25 MG tablet TAKE 1 TABLET EVERY DAY 90 tablet 3   traMADol  (ULTRAM ) 50 MG tablet Take 1 tablet (50 mg total) by mouth every 6 (six) hours as needed. 30 tablet 0   triamcinolone  (NASACORT ) 55 MCG/ACT AERO nasal inhaler Place 2 sprays into the nose daily. 1 Inhaler 12   No current facility-administered medications on file prior to visit.        ROS:  All others reviewed and negative.  Objective        PE:  BP 118/76   Pulse 77   Ht 5' 10 (1.778 m)   Wt 152 lb 9.6 oz (69.2 kg)   SpO2 99%   BMI 21.90 kg/m                 Constitutional: Pt appears in NAD               HENT: Head: NCAT.                Right Ear: External ear normal.                 Left Ear: External ear normal.                Eyes: . Pupils are equal, round, and reactive to light. Conjunctivae and EOM are normal               Nose: without d/c or deformity               Neck: Neck supple. Gross normal ROM               Cardiovascular: Normal rate and regular rhythm.                 Pulmonary/Chest: Effort normal and breath sounds without rales or wheezing.                Abd:  Soft, NT, ND, + BS, no organomegaly               Neurological: Pt is alert. At baseline orientation, motor grossly intact               Skin: Skin is warm. No rashes, no other new lesions, LE edema - none  Psychiatric: Pt behavior is normal without agitation   Micro: none  Cardiac tracings I have personally interpreted today:  none  Pertinent Radiological findings (summarize): none   Lab Results  Component Value Date   WBC 4.8 12/16/2023   HGB 10.7 (L) 12/16/2023   HCT 33.3 (L) 12/16/2023   PLT 188 12/16/2023   GLUCOSE 89 12/16/2023   CHOL 109 08/17/2023   TRIG 47.0 08/17/2023   HDL 35.80 (L) 08/17/2023   LDLCALC 64 08/17/2023   ALT 20 08/17/2023   AST 26 08/17/2023   NA 137  12/16/2023   K 4.8 12/16/2023   CL 105 12/16/2023   CREATININE 1.89 (H) 12/16/2023   BUN 23 12/16/2023   CO2 24 12/16/2023   TSH 4.51 10/23/2022   PSA 4.93 (H) 04/29/2021   INR 1.1 12/16/2023   HGBA1C 6.2 08/17/2023   Assessment/Plan:  Siddarth Hsiung is a 83 y.o. Black or African American [2] male with  has a past medical history of Alcohol abuse, Anemia, iron  deficiency, AR (allergic rhinitis), BPH (benign prostatic hypertrophy), CAD (coronary artery disease) of artery bypass graft, CHF (congestive heart failure) (HCC), CKD (chronic kidney disease) (09/30/2011), COPD (chronic obstructive pulmonary disease) (HCC), Crohn's disease (HCC), Dysuria, ED (erectile dysfunction) of organic origin, Fatigue, Glaucoma, Glucose intolerance (impaired glucose tolerance), HLD (hyperlipidemia), HTN (hypertension), Impaired glucose tolerance (09/27/2011), MI (myocardial infarction) (HCC), Moderate or severe vision impairment, both eyes, impairment level not further specified, Postherpetic neuralgia, PSA elevation, Routine general medical examination at a health care facility, Shingles, Skin cancer, Special screening for malignant neoplasm of prostate, and Urinary frequency.  Night sweats Mild to mod,  Common causes include testosterone levels, sleep apnea, stress, hormones, medications, and infections; etiology unclear today, pt to f/u any worsening symptoms or concerns, for cxr and lab today  Iron  deficiency anemia With recent recurring right nosebleed - for f/u cbc, iron , declines ENT for now  Essential hypertension BP Readings from Last 3 Encounters:  03/22/24 118/76  12/18/23 120/84  12/16/23 (!) 162/88   Stable, pt to continue medical treatment coreg  12.5 bid, aldcatone 25 qd   CKD (chronic kidney disease) Lab Results  Component Value Date   CREATININE 1.89 (H) 12/16/2023   Stable overall, cont to avoid nephrotoxins   Vitamin D  deficiency Last vitamin D  Lab Results  Component Value Date    VD25OH 22.35 (L) 05/14/2023   Low, to start oral replacement  Followup: Return in about 3 months (around 06/20/2024).  Lynwood Rush, MD 03/22/2024 9:59 AM [facility] - [facility] Internal Medicine

## 2024-03-22 NOTE — Progress Notes (Addendum)
 Subjective:   Gary Levy is a 83 y.o. who presents for a Medicare Wellness preventive visit.  As a reminder, Annual Wellness Visits don't include a physical exam, and some assessments may be limited, especially if this visit is performed virtually. We may recommend an in-person follow-up visit with your provider if needed.  Visit Complete: Virtual I connected with  Gary Levy on 03/22/24 by a audio enabled telemedicine application and verified that I am speaking with the correct person using two identifiers.  Patient Location: Home  Provider Location: Home Office  I discussed the limitations of evaluation and management by telemedicine. The patient expressed understanding and agreed to proceed.  Vital Signs: Because this visit was a virtual/telehealth visit, some criteria may be missing or patient reported. Any vitals not documented were not able to be obtained and vitals that have been documented are patient reported.  VideoDeclined- This patient declined Librarian, academic. Therefore the visit was completed with audio only.  Persons Participating in Visit: Patient.  AWV Questionnaire: No: Patient Medicare AWV questionnaire was not completed prior to this visit.  Cardiac Risk Factors include: advanced age (>96men, >95 women);male gender;hypertension;Other (see comment), Risk factor comments: CHF, CAD, COPD, BPH, CKD     Objective:    Today's Vitals   03/22/24 1009  Weight: 152 lb (68.9 kg)  Height: 5' 10 (1.778 m)   Body mass index is 21.81 kg/m.     03/22/2024   10:17 AM 08/14/2023    9:29 AM 08/11/2023    9:09 AM 03/17/2023    9:56 AM 02/20/2022    1:34 PM 06/27/2019    2:43 PM 10/01/2018    9:56 AM  Advanced Directives  Does Patient Have a Medical Advance Directive? Yes No No Yes No Yes No   Type of Advance Directive --   Healthcare Power of [place];Living will  Healthcare Power of [place];Living will   Copy of Healthcare Power of  Attorney in Chart? No - copy requested   No - copy requested  No - copy requested   Would patient like information on creating a medical advance directive?   No - Patient declined  No - Patient declined  No - Patient declined      Data saved with a previous flowsheet row definition    Current Medications (verified) Outpatient Encounter Medications as of 03/22/2024  Medication Sig   Alcohol Swabs (B-D SINGLE USE SWABS REGULAR) PADS Use as directed twice daily E11.9   allopurinol  (ZYLOPRIM ) 100 MG tablet TAKE 1 TABLET EVERY DAY   aspirin 81 MG EC tablet Take 81 mg by mouth daily.   atorvastatin  (LIPITOR) 80 MG tablet TAKE 1 TABLET EVERY DAY AT 6:00 PM   Blood Glucose Calibration (TRUE METRIX LEVEL 1) Low SOLN Use as directed once daily E11.9   blood glucose meter kit and supplies KIT Use daily to check blood sugar levels.   carvedilol  (COREG ) 12.5 MG tablet TAKE 1 TABLET TWICE DAILY WITH MEALS   cetirizine  (ZYRTEC ) 10 MG tablet Take 1 tablet (10 mg total) by mouth daily.   colchicine  0.6 MG tablet TAKE 1 TABLET EVERY DAY   ezetimibe  (ZETIA ) 10 MG tablet TAKE 1 TABLET EVERY DAY   HYDROcodone -acetaminophen  (NORCO/VICODIN) 5-325 MG tablet Take 1-2 tablets by mouth every 4 (four) hours as needed.   montelukast  (SINGULAIR ) 10 MG tablet Take 1 tablet (10 mg total) by mouth daily.   Multiple Vitamins-Minerals (CENTRUM PO) Take 1 tablet by mouth daily.  nitroGLYCERIN  (NITROSTAT ) 0.4 MG SL tablet Place 0.4 mg under the tongue every 5 (five) minutes as needed for chest pain (Call 911 at 3rd dose within 15 minutes).   pantoprazole  (PROTONIX ) 40 MG tablet Take 1 tablet (40 mg total) by mouth daily.   solifenacin  (VESICARE ) 5 MG tablet TAKE 1 TABLET (5 MG TOTAL) BY MOUTH DAILY.   spironolactone  (ALDACTONE ) 25 MG tablet TAKE 1 TABLET EVERY DAY   traMADol  (ULTRAM ) 50 MG tablet Take 1 tablet (50 mg total) by mouth every 6 (six) hours as needed.   triamcinolone  (NASACORT ) 55 MCG/ACT AERO nasal inhaler Place 2  sprays into the nose daily.   No facility-administered encounter medications on file as of 03/22/2024.    Allergies (verified) Patient has no known allergies.   History: Past Medical History:  Diagnosis Date   Alcohol abuse    hx   Anemia, iron  deficiency    AR (allergic rhinitis)    BPH (benign prostatic hypertrophy)    CAD (coronary artery disease) of artery bypass graft    CHF (congestive heart failure) (HCC)    CKD (chronic kidney disease) 09/30/2011   COPD (chronic obstructive pulmonary disease) (HCC)    Crohn's disease (HCC)    Dysuria    ED (erectile dysfunction) of organic origin    Fatigue    Glaucoma    Glucose intolerance (impaired glucose tolerance)    HLD (hyperlipidemia)    HTN (hypertension)    Impaired glucose tolerance 09/27/2011   MI (myocardial infarction) (HCC)    hx   Moderate or severe vision impairment, both eyes, impairment level not further specified    Postherpetic neuralgia    PSA elevation    Routine general medical examination at a health care facility    Shingles    Skin cancer    hx; non-melanoma   Special screening for malignant neoplasm of prostate    Urinary frequency    Past Surgical History:  Procedure Laterality Date   CORONARY ANGIOPLASTY WITH STENT PLACEMENT     CORONARY ARTERY BYPASS GRAFT     11+ years ago; has an EF of about 45%, last Myoview  showed no ischemia 12/30/06   [facility]  2011   Dr. Althia    right cataract Bilateral 07/2010   with lens implant    Family History  Problem Relation Age of Onset   Heart disease Mother    Social History   Socioeconomic History   Marital status: Married    Spouse name: Gary Levy   Number of children: 6   Years of education: Not on file   Highest education level: Not on file  Occupational History   Occupation: Retired  Tobacco Use   Smoking status: Former    Current packs/day: 0.00    Types: Cigarettes    Quit date: 03/20/1981    Years since quitting: 43.0   Smokeless tobacco: Never   Vaping Use   Vaping status: Never Used  Substance and Sexual Activity   Alcohol use: No   Drug use: No   Sexual activity: Yes  Other Topics Concern   Not on file  Social History Narrative   Married, 4 children; retired - Arts development officer.Designated party release on file. [employer]. 01/15/10.    Lives with wife/2025   Social Drivers of Health   Financial Resource Strain: Low Risk  (03/22/2024)   Overall Financial Resource Strain (CARDIA)    Difficulty of Paying Living Expenses: Not hard at all  Food Insecurity: No Food Insecurity (  03/22/2024)   Hunger Vital Sign    Worried About Running Out of Food in the Last Year: Never true    Ran Out of Food in the Last Year: Never true  Transportation Needs: No Transportation Needs (03/22/2024)   PRAPARE - Administrator, Civil Service (Medical): No    Lack of Transportation (Non-Medical): No  Physical Activity: Inactive (03/22/2024)   Exercise Vital Sign    Days of Exercise per Week: 0 days    Minutes of Exercise per Session: 0 min  Stress: No Stress Concern Present (03/22/2024)   [employer] of Occupational Health - Occupational Stress Questionnaire    Feeling of Stress: Not at all  Social Connections: Moderately Isolated (03/22/2024)   Social Connection and Isolation Panel    Frequency of Communication with Friends and Family: Twice a week    Frequency of Social Gatherings with Friends and Family: More than three times a week    Attends Religious Services: Never    Database administrator or Organizations: No    Attends Engineer, structural: Never    Marital Status: Married    Tobacco Counseling Counseling given: Not Answered    Clinical Intake:  Pre-visit preparation completed: Yes  Pain : No/denies pain     BMI - recorded: 21.81 Nutritional Status: BMI of 19-24  Normal Nutritional Risks: None  Lab Results  Component Value Date   HGBA1C 6.2 08/17/2023   HGBA1C 5.9 11/07/2022   HGBA1C 5.9  10/23/2022     How often do you need to have someone help you when you read instructions, pamphlets, or other written materials from your doctor or pharmacy?: 1 - Never  Interpreter Needed?: No  Information entered by :: Jenilee Franey, RMA   Activities of Daily Living     03/22/2024   10:15 AM  In your present state of health, do you have any difficulty performing the following activities:  Hearing? 0  Vision? 0  Difficulty concentrating or making decisions? 0  Walking or climbing stairs? 0  Dressing or bathing? 0  Doing errands, shopping? 0  Preparing Food and eating ? N  Using the Toilet? N  In the past six months, have you accidently leaked urine? N  Do you have problems with loss of bowel control? N  Managing your Medications? N  Managing your Finances? N  Housekeeping or managing your Housekeeping? N    Patient Care Team: Norleen Lynwood ORN, MD as PCP - General (Internal Medicine) Verlin Lonni BIRCH, MD as PCP - Cardiology (Cardiology) Camillo Golas, MD as Consulting Physician (Ophthalmology) Marcey Elspeth PARAS, MD as Consulting Physician (Ophthalmology)  I have updated your Care Teams any recent Medical Services you may have received from other providers in the past year.     Assessment:   This is a routine wellness examination for Marlette.  Hearing/Vision screen Hearing Screening - Comments:: Denies hearing difficulties   Vision Screening - Comments:: Wears eyeglasses/Diby   Goals Addressed               This Visit's Progress     Patient Stated (pt-stated)   On track     Continue to be as active as possible. Enjoy life and family. Same Goal-03/17/23-BT       Depression Screen     03/22/2024   10:22 AM 12/18/2023    8:32 AM 08/17/2023    8:31 AM 05/14/2023    8:06 AM 03/17/2023   10:03 AM 11/11/2022  8:30 AM 10/28/2022   11:12 AM  PHQ 2/9 Scores  PHQ - 2 Score 0 0 0 0 0 0 0  PHQ- 9 Score 0   0 0 0     Fall Risk     03/22/2024   10:17 AM 12/18/2023     8:36 AM 08/17/2023    8:31 AM 05/14/2023    8:06 AM 03/17/2023    9:56 AM  Fall Risk   Falls in the past year? 0 0 0 0 0  Number falls in past yr: 0 0 0 0 0  Injury with Fall? 0 0 0 0 0  Risk for fall due to :  No Fall Risks No Fall Risks No Fall Risks No Fall Risks  Follow up Falls evaluation completed;Falls prevention discussed Falls evaluation completed Falls evaluation completed Falls evaluation completed Falls prevention discussed    MEDICARE RISK AT HOME:  Medicare Risk at Home Any stairs in or around the home?: No If so, are there any without handrails?: No Home free of loose throw rugs in walkways, pet beds, electrical cords, etc?: Yes Adequate lighting in your home to reduce risk of falls?: Yes Life alert?: No Use of a cane, walker or w/c?: No Grab bars in the bathroom?: Yes Shower chair or bench in shower?: No Elevated toilet seat or a handicapped toilet?: No  TIMED UP AND GO:  Was the test performed?  No  Cognitive Function: 6CIT completed    04/22/2018    8:31 AM  MMSE - Mini Mental State Exam  Orientation to time 5  Orientation to Place 5  Registration 3  Attention/ Calculation 0  Attention/Calculation-comments states he cannot do this  Recall 2  Language- name 2 objects 2  Language- repeat 1  Language- follow 3 step command 3  Language- read & follow direction 1  Write a sentence 1  Copy design 1  Total score 24        03/22/2024   10:18 AM 03/17/2023    9:58 AM 02/20/2022    1:41 PM  6CIT Screen  What Year? 0 points 0 points 0 points  What month? 0 points 0 points 0 points  What time? 0 points 0 points 0 points  Count back from 20 0 points 0 points 0 points  Months in reverse 0 points  0 points  Repeat phrase 2 points  0 points  Total Score 2 points  0 points    Immunizations Immunization History  Administered Date(s) Administered   Fluad Quad(high Dose 65+) 04/15/2019, 06/02/2020, 04/29/2021, 04/29/2022   Fluad Trivalent(High Dose 65+)  05/14/2023   Influenza Split 05/05/2012   Influenza Whole 08/15/2009, 06/11/2010   Influenza, High Dose Seasonal PF 06/07/2013, 04/12/2015, 07/01/2016, 04/14/2017, 04/16/2018   Influenza,inj,Quad PF,6+ Mos 04/11/2014   PFIZER(Purple Top)SARS-COV-2 Vaccination 09/23/2019, 10/14/2019, 05/28/2020   Pneumococcal Conjugate-13 10/12/2013   Pneumococcal Polysaccharide-23 08/19/1995, 08/15/2009   Td 08/19/1995, 08/15/2009    Screening Tests Health Maintenance  Topic Date Due   Zoster Vaccines- Shingrix (1 of 2) Never done   DTaP/Tdap/Td (3 - Tdap) 08/16/2019   INFLUENZA VACCINE  03/18/2024   Medicare Annual Wellness (AWV)  03/22/2025   Pneumococcal Vaccine: 50+ Years  Completed   Hepatitis B Vaccines  Aged Out   HPV VACCINES  Aged Out   Meningococcal B Vaccine  Aged Out   COVID-19 Vaccine  Discontinued    Health Maintenance  Health Maintenance Due  Topic Date Due   Zoster Vaccines- Shingrix (1 of  2) Never done   DTaP/Tdap/Td (3 - Tdap) 08/16/2019   INFLUENZA VACCINE  03/18/2024   Health Maintenance Items Addressed: See Nurse Notes at the end of this note  Additional Screening:  Vision Screening: Recommended annual ophthalmology exams for early detection of glaucoma and other disorders of the eye. Would you like a referral to an eye doctor? No    Dental Screening: Recommended annual dental exams for proper oral hygiene  Community Resource Referral / Chronic Care Management: CRR required this visit?  No   CCM required this visit?  No   Plan:    I have personally reviewed and noted the following in the patient's chart:   Medical and social history Use of alcohol, tobacco or illicit drugs  Current medications and supplements including opioid prescriptions. Patient is not currently taking opioid prescriptions. Functional ability and status Nutritional status Physical activity Advanced directives List of other physicians Hospitalizations, surgeries, and ER visits in  previous 12 months Vitals Screenings to include cognitive, depression, and falls Referrals and appointments  In addition, I have reviewed and discussed with patient certain preventive protocols, quality metrics, and best practice recommendations. A written personalized care plan for preventive services as well as general preventive health recommendations were provided to patient.   Lennart Gladish L Lavilla Delamora, CMA   03/22/2024   After Visit Summary: (MyChart) Due to this being a telephonic visit, the after visit summary with patients personalized plan was offered to patient via MyChart   Notes: Patient is due a Tdap and a Shingrix vaccine.  He had no other concerns to address today.

## 2024-03-22 NOTE — Assessment & Plan Note (Signed)
 Lab Results  Component Value Date   CREATININE 1.89 (H) 12/16/2023   Stable overall, cont to avoid nephrotoxins

## 2024-03-22 NOTE — Assessment & Plan Note (Addendum)
 Mild to mod,  Common causes include testosterone levels, sleep apnea, stress, hormones, medications, and infections; etiology unclear today, pt to f/u any worsening symptoms or concerns, for cxr and lab today

## 2024-03-23 ENCOUNTER — Other Ambulatory Visit: Payer: Self-pay | Admitting: Internal Medicine

## 2024-03-23 ENCOUNTER — Ambulatory Visit: Payer: Self-pay | Admitting: Internal Medicine

## 2024-03-23 DIAGNOSIS — D509 Iron deficiency anemia, unspecified: Secondary | ICD-10-CM

## 2024-03-23 MED ORDER — POLYSACCHARIDE IRON COMPLEX 150 MG PO CAPS: 150.0000 mg | ORAL_CAPSULE | Freq: Every day | ORAL | 0 refills | Status: DC

## 2024-04-01 ENCOUNTER — Other Ambulatory Visit: Payer: Self-pay | Admitting: Internal Medicine

## 2024-04-06 NOTE — Progress Notes (Unsigned)
 No chief complaint on file.  History of Present Illness: 83 yo male with history of CAD s/p 4V CABG in 1995, ischemic cardiomyopathy, HTN, HLD, COPD, CKD here today for cardiac follow up. He has CAD dating back to the 1990s with prior PCI and CABG. The last cath report I can find is scanned into EPIC from 1999 and shows failure of the LIMA graft to the LAD and the SVG to the RCA. There was high grade disease in the SVG to the OM and the SVG to the Diagonal. It appears that the LAD was treated with rotablator atherectomy and stenting in 1999. The SVG to the OM and the SVG to the Diagonal were treated with angioplasty. I saw him 07/28/13 and he had c/o exertional chest pains, resolved with rest. Stress myoview  08/17/13 Intermediate risk study with a fixed large, severe inferolateral, basal to mid inferior, and mid to apical anterior perfusion defect. This suggested prior infarction with no significant ischemia. Echo April 2019 with LVEF=40-45%. Mild MR. Echo June 2022 with LVEF=45%. Trivial MR.   He is here today for follow up. The patient denies any chest pain, dyspnea, palpitations, lower extremity edema, orthopnea, PND, dizziness, near syncope or syncope.   Primary Care Physician: Norleen Lynwood ORN, MD  Past Medical History:  Diagnosis Date   Alcohol abuse    hx   Anemia, iron  deficiency    AR (allergic rhinitis)    BPH (benign prostatic hypertrophy)    CAD (coronary artery disease) of artery bypass graft    CHF (congestive heart failure) (HCC)    CKD (chronic kidney disease) 09/30/2011   COPD (chronic obstructive pulmonary disease) (HCC)    Crohn's disease (HCC)    Dysuria    ED (erectile dysfunction) of organic origin    Fatigue    Glaucoma    Glucose intolerance (impaired glucose tolerance)    HLD (hyperlipidemia)    HTN (hypertension)    Impaired glucose tolerance 09/27/2011   MI (myocardial infarction) (HCC)    hx   Moderate or severe vision impairment, both eyes, impairment level  not further specified    Postherpetic neuralgia    PSA elevation    Routine general medical examination at a health care facility    Shingles    Skin cancer    hx; non-melanoma   Special screening for malignant neoplasm of prostate    Urinary frequency     Past Surgical History:  Procedure Laterality Date   CORONARY ANGIOPLASTY WITH STENT PLACEMENT     CORONARY ARTERY BYPASS GRAFT     11+ years ago; has an EF of about 45%, last Myoview  showed no ischemia 12/30/06   [facility]  2011   Dr. Althia    right cataract Bilateral 07/2010   with lens implant     Current Outpatient Medications  Medication Sig Dispense Refill   Alcohol Swabs (B-D SINGLE USE SWABS REGULAR) PADS Use as directed twice daily E11.9 200 each 3   allopurinol  (ZYLOPRIM ) 100 MG tablet TAKE 1 TABLET EVERY DAY 90 tablet 3   aspirin 81 MG EC tablet Take 81 mg by mouth daily.     atorvastatin  (LIPITOR) 80 MG tablet TAKE 1 TABLET EVERY DAY AT 6:00 PM 90 tablet 3   Blood Glucose Calibration (TRUE METRIX LEVEL 1) Low SOLN Use as directed once daily E11.9 3 each 3   blood glucose meter kit and supplies KIT Use daily to check blood sugar levels. 1 each 0   carvedilol  (  COREG ) 12.5 MG tablet TAKE 1 TABLET TWICE DAILY WITH MEALS 180 tablet 3   cetirizine  (ZYRTEC ) 10 MG tablet Take 1 tablet (10 mg total) by mouth daily. 30 tablet 11   colchicine  0.6 MG tablet TAKE 1 TABLET EVERY DAY 90 tablet 3   ezetimibe  (ZETIA ) 10 MG tablet TAKE 1 TABLET EVERY DAY 90 tablet 2   HYDROcodone -acetaminophen  (NORCO/VICODIN) 5-325 MG tablet Take 1-2 tablets by mouth every 4 (four) hours as needed. 10 tablet 0   iron  polysaccharides (NU-IRON ) 150 MG capsule Take 1 capsule (150 mg total) by mouth daily. 90 capsule 0   montelukast  (SINGULAIR ) 10 MG tablet Take 1 tablet (10 mg total) by mouth daily. 90 tablet 3   Multiple Vitamins-Minerals (CENTRUM PO) Take 1 tablet by mouth daily.     nitroGLYCERIN  (NITROSTAT ) 0.4 MG SL tablet Place 0.4 mg under the tongue  every 5 (five) minutes as needed for chest pain (Call 911 at 3rd dose within 15 minutes).     pantoprazole  (PROTONIX ) 40 MG tablet Take 1 tablet (40 mg total) by mouth daily. 90 tablet 3   solifenacin  (VESICARE ) 5 MG tablet TAKE 1 TABLET (5 MG TOTAL) BY MOUTH DAILY. 90 tablet 3   spironolactone  (ALDACTONE ) 25 MG tablet TAKE 1 TABLET EVERY DAY 90 tablet 3   traMADol  (ULTRAM ) 50 MG tablet Take 1 tablet (50 mg total) by mouth every 6 (six) hours as needed. 30 tablet 0   triamcinolone  (NASACORT ) 55 MCG/ACT AERO nasal inhaler Place 2 sprays into the nose daily. 1 Inhaler 12   No current facility-administered medications for this visit.    No Known Allergies  Social History   Socioeconomic History   Marital status: Married    Spouse name: Mary   Number of children: 6   Years of education: Not on file   Highest education level: Not on file  Occupational History   Occupation: Retired  Tobacco Use   Smoking status: Former    Current packs/day: 0.00    Types: Cigarettes    Quit date: 03/20/1981    Years since quitting: 43.0   Smokeless tobacco: Never  Vaping Use   Vaping status: Never Used  Substance and Sexual Activity   Alcohol use: No   Drug use: No   Sexual activity: Yes  Other Topics Concern   Not on file  Social History Narrative   Married, 4 children; retired - Arts development officer.Designated party release on file. [employer]. 01/15/10.    Lives with wife/2025   Social Drivers of Health   Financial Resource Strain: Low Risk  (03/22/2024)   Overall Financial Resource Strain (CARDIA)    Difficulty of Paying Living Expenses: Not hard at all  Food Insecurity: No Food Insecurity (03/22/2024)   Hunger Vital Sign    Worried About Running Out of Food in the Last Year: Never true    Ran Out of Food in the Last Year: Never true  Transportation Needs: No Transportation Needs (03/22/2024)   PRAPARE - Administrator, Civil Service (Medical): No    Lack of Transportation  (Non-Medical): No  Physical Activity: Inactive (03/22/2024)   Exercise Vital Sign    Days of Exercise per Week: 0 days    Minutes of Exercise per Session: 0 min  Stress: No Stress Concern Present (03/22/2024)   [employer] of Occupational Health - Occupational Stress Questionnaire    Feeling of Stress: Not at all  Social Connections: Moderately Isolated (03/22/2024)   Social Connection and  Isolation Panel    Frequency of Communication with Friends and Family: Twice a week    Frequency of Social Gatherings with Friends and Family: More than three times a week    Attends Religious Services: Never    Database administrator or Organizations: No    Attends Banker Meetings: Never    Marital Status: Married  Catering manager Violence: Not At Risk (03/22/2024)   Humiliation, Afraid, Rape, and Kick questionnaire    Fear of Current or Ex-Partner: No    Emotionally Abused: No    Physically Abused: No    Sexually Abused: No    Family History  Problem Relation Age of Onset   Heart disease Mother     Review of Systems:  As stated in the HPI and otherwise negative.   There were no vitals taken for this visit.  Physical Examination:  General: Well developed, well nourished, NAD  HEENT: OP clear, mucus membranes moist  SKIN: warm, dry. No rashes. Neuro: No focal deficits  Musculoskeletal: Muscle strength 5/5 all ext  Psychiatric: Mood and affect normal  Neck: No JVD, no carotid bruits, no thyromegaly, no lymphadenopathy.  Lungs:Clear bilaterally, no wheezes, rhonci, crackles Cardiovascular: Regular rate and rhythm. No murmurs, gallops or rubs. Abdomen:Soft. Bowel sounds present. Non-tender.  Extremities: No lower extremity edema. Pulses are 2 + in the bilateral DP/PT.   EKG:  EKG is ordered today. The ekg ordered today demonstrates Sinus bradycardia, rate 52 bpm. Lateral T wave inversions  Recent Labs: 03/22/2024: ALT 21; BUN 25; Creatinine, Ser 2.01; Hemoglobin 11.5;  Platelets 174.0; Potassium 5.1; Sodium 137   Lipid Panel    Component Value Date/Time   CHOL 109 03/22/2024 0904   CHOL 139 01/16/2021 0758   TRIG 45.0 03/22/2024 0904   HDL 42.30 03/22/2024 0904   HDL 42 01/16/2021 0758   CHOLHDL 3 03/22/2024 0904   VLDL 9.0 03/22/2024 0904   LDLCALC 58 03/22/2024 0904   LDLCALC 85 01/16/2021 0758     Wt Readings from Last 3 Encounters:  03/22/24 152 lb (68.9 kg)  03/22/24 152 lb 9.6 oz (69.2 kg)  12/18/23 154 lb (69.9 kg)    Assessment and Plan:   1. CAD s/p CABG without angina: No chest pain. Continue ASA, beta blocker and statin.        2. HTN: BP is controlled. No changes today  3. Hyperlipidemia: LDL ***. Continue statin.     4. Ischemic cardiomyopathy: Last LVEF 45% by echo in 2022. He is not on an Ace -inh or ARB due to renal insufficiency. Continue Coreg  and aldactone .   5. Chronic systolic CHF: Weight is stable. No volume overload on exam.    Labs/ tests ordered today include:  No orders of the defined types were placed in this encounter.  Disposition:   F/U with me in 12  months  Signed, Lonni Cash, MD 04/06/2024 12:56 PM    [facility] [address] [place], [place], [place]  [address] Phone: [phone]; Fax: [phone]

## 2024-04-07 ENCOUNTER — Ambulatory Visit: Attending: Cardiovascular Disease | Admitting: Cardiovascular Disease

## 2024-04-07 ENCOUNTER — Encounter: Payer: Self-pay | Admitting: Cardiovascular Disease

## 2024-04-07 VITALS — BP 176/70 | HR 52 | Ht 70.0 in | Wt 148.6 lb

## 2024-04-07 DIAGNOSIS — I2510 Atherosclerotic heart disease of native coronary artery without angina pectoris: Secondary | ICD-10-CM

## 2024-04-07 DIAGNOSIS — I10 Essential (primary) hypertension: Secondary | ICD-10-CM | POA: Diagnosis not present

## 2024-04-07 DIAGNOSIS — I5020 Unspecified systolic (congestive) heart failure: Secondary | ICD-10-CM | POA: Diagnosis not present

## 2024-04-07 DIAGNOSIS — I255 Ischemic cardiomyopathy: Secondary | ICD-10-CM

## 2024-04-07 DIAGNOSIS — E785 Hyperlipidemia, unspecified: Secondary | ICD-10-CM

## 2024-04-07 NOTE — Patient Instructions (Signed)
 Medication Instructions:  No changes *If you need a refill on your cardiac medications before your next appointment, please call your pharmacy*  Lab Work: none If you have labs (blood work) drawn today and your tests are completely normal, you will receive your results only by: MyChart Message (if you have MyChart) OR A paper copy in the mail If you have any lab test that is abnormal or we need to change your treatment, we will call you to review the results.  Testing/Procedures: none  Follow-Up: At [facility], you and your health needs are our priority.  As part of our continuing mission to provide you with exceptional heart care, our providers are all part of one team.  This team includes your primary Cardiologist (physician) and Advanced Practice Providers or APPs (Physician Assistants and Nurse Practitioners) who all work together to provide you with the care you need, when you need it.  Your next appointment:   12 month(s)  Provider:   Antoinette Batman, MD

## 2024-04-19 ENCOUNTER — Other Ambulatory Visit: Payer: Self-pay | Admitting: Cardiovascular Disease

## 2024-04-19 ENCOUNTER — Other Ambulatory Visit: Payer: Self-pay | Admitting: Internal Medicine

## 2024-05-13 ENCOUNTER — Ambulatory Visit: Admitting: Internal Medicine

## 2024-05-17 ENCOUNTER — Telehealth: Payer: Self-pay | Admitting: Radiology

## 2024-05-17 NOTE — Telephone Encounter (Signed)
 Copied from CRM [id number]. Topic: Clinical - Medication Question >> May 17, 2024 10:04 AM Suzen RAMAN wrote: Reason for CRM: Patient wife on the phone has questions about patient current medication solifenacin  (VESICARE ) 5 MG tablet. Patient wife received a letter from [facility] stating medication could be harmful based on patient age.    CB# [phone]

## 2024-05-20 NOTE — Telephone Encounter (Signed)
 Pt's wife called back and has not been contacted about this. Please call and advise.

## 2024-05-23 NOTE — Telephone Encounter (Signed)
 Called and spoke with patient's spouse. Informed her that at this time Dr.John would be out for the week and would not be able to answer back on this issue until some time next week. I also let her know that this didn't seem like a new medication for patient, with scripts going back to 2019. Most recent set of scripts starting march of 2024

## 2024-05-24 NOTE — Telephone Encounter (Signed)
 Actually the vesicare  has the least potential for side effects of its peers which include detrol, toviaz and others  The most concern would be about constipation or dry mouth.  All medications can potentially have side effects, but this is one that is otherwise quite safe and widely used,  If the pt or wife is not comfortable with  this, we can try to change to Myrbetrix or Gemtessa which work differently.  thanks

## 2024-05-27 NOTE — Telephone Encounter (Signed)
 Called and reviewed PCP message with patient's spouse. They expressed understanding

## 2024-06-06 ENCOUNTER — Other Ambulatory Visit: Payer: Self-pay

## 2024-06-06 ENCOUNTER — Other Ambulatory Visit: Payer: Self-pay | Admitting: Internal Medicine

## 2024-06-20 ENCOUNTER — Ambulatory Visit: Admitting: Internal Medicine

## 2024-06-20 DIAGNOSIS — N1832 Chronic kidney disease, stage 3b: Secondary | ICD-10-CM | POA: Diagnosis not present

## 2024-06-21 ENCOUNTER — Encounter: Payer: Self-pay | Admitting: Internal Medicine

## 2024-06-21 ENCOUNTER — Ambulatory Visit (INDEPENDENT_AMBULATORY_CARE_PROVIDER_SITE_OTHER): Admitting: Internal Medicine

## 2024-06-21 VITALS — BP 110/68 | HR 61 | Temp 98.3°F | Ht 70.0 in | Wt 153.0 lb

## 2024-06-21 DIAGNOSIS — Z23 Encounter for immunization: Secondary | ICD-10-CM | POA: Diagnosis not present

## 2024-06-21 DIAGNOSIS — R7302 Impaired glucose tolerance (oral): Secondary | ICD-10-CM | POA: Diagnosis not present

## 2024-06-21 DIAGNOSIS — I10 Essential (primary) hypertension: Secondary | ICD-10-CM

## 2024-06-21 DIAGNOSIS — J449 Chronic obstructive pulmonary disease, unspecified: Secondary | ICD-10-CM | POA: Diagnosis not present

## 2024-06-21 DIAGNOSIS — I5022 Chronic systolic (congestive) heart failure: Secondary | ICD-10-CM | POA: Diagnosis not present

## 2024-06-21 DIAGNOSIS — E7800 Pure hypercholesterolemia, unspecified: Secondary | ICD-10-CM | POA: Diagnosis not present

## 2024-06-21 DIAGNOSIS — N1832 Chronic kidney disease, stage 3b: Secondary | ICD-10-CM | POA: Diagnosis not present

## 2024-06-21 DIAGNOSIS — E559 Vitamin D deficiency, unspecified: Secondary | ICD-10-CM

## 2024-06-21 DIAGNOSIS — D509 Iron deficiency anemia, unspecified: Secondary | ICD-10-CM | POA: Diagnosis not present

## 2024-06-21 LAB — CBC WITH DIFFERENTIAL/PLATELET
Basophils Absolute: 0.1 K/uL (ref 0.0–0.1)
Basophils Relative: 1.8 % (ref 0.0–3.0)
Eosinophils Absolute: 0.2 K/uL (ref 0.0–0.7)
Eosinophils Relative: 4.3 % (ref 0.0–5.0)
HCT: 34.9 % — ABNORMAL LOW (ref 39.0–52.0)
Hemoglobin: 11.3 g/dL — ABNORMAL LOW (ref 13.0–17.0)
Lymphocytes Relative: 23.4 % (ref 12.0–46.0)
Lymphs Abs: 1 K/uL (ref 0.7–4.0)
MCHC: 32.4 g/dL (ref 30.0–36.0)
MCV: 87.6 fl (ref 78.0–100.0)
Monocytes Absolute: 0.6 K/uL (ref 0.1–1.0)
Monocytes Relative: 15.1 % — ABNORMAL HIGH (ref 3.0–12.0)
Neutro Abs: 2.3 K/uL (ref 1.4–7.7)
Neutrophils Relative %: 55.4 % (ref 43.0–77.0)
Platelets: 156 K/uL (ref 150.0–400.0)
RBC: 3.98 Mil/uL — ABNORMAL LOW (ref 4.22–5.81)
RDW: 17.1 % — ABNORMAL HIGH (ref 11.5–15.5)
WBC: 4.1 K/uL (ref 4.0–10.5)

## 2024-06-21 LAB — IBC PANEL
Iron: 39 ug/dL — ABNORMAL LOW (ref 42–165)
Saturation Ratios: 10.5 % — ABNORMAL LOW (ref 20.0–50.0)
TIBC: 371 ug/dL (ref 250.0–450.0)
Transferrin: 265 mg/dL (ref 212.0–360.0)

## 2024-06-21 NOTE — Assessment & Plan Note (Signed)
 Lab Results  Component Value Date   CREATININE 2.01 (H) 03/22/2024   Stable by report, declines repeat lab today, pt to cont to avoid nephrotoxins, f/u renal as planned ckd3b

## 2024-06-21 NOTE — Assessment & Plan Note (Signed)
 Lab Results  Component Value Date   HGBA1C 6.2 03/22/2024   Stable, pt to continue current medical treatment  - diet, wt control

## 2024-06-21 NOTE — Assessment & Plan Note (Signed)
Volume stable, cont current med tx

## 2024-06-21 NOTE — Assessment & Plan Note (Signed)
 BP Readings from Last 3 Encounters:  06/21/24 110/68  04/07/24 (!) 176/70  03/22/24 118/76   Stable, pt to continue medical treatment coreg  12.5 bid,

## 2024-06-21 NOTE — Progress Notes (Signed)
 Patient ID: Gary Levy, male   DOB: 04/04/1941, 83 y.o.   MRN: [id number]        Chief Complaint: follow up HTN, HLD and hyperglycemia, low vit d, copd, chf, ckd3a       HPI:  Gary Levy is a 83 y.o. male here overall doing well.  Pt denies chest pain, increased sob or doe, wheezing, orthopnea, PND, increased LE swelling, palpitations, dizziness or syncope, except for some sob with going up and down 5 steps into the house too fast too many times such as carrying groceries.   Pt denies polydipsia, polyuria, or new focal neuro s/s.    Pt denies fever, wt loss, night sweats, loss of appetite, or other constitutional symptoms    Had renal lab yesterday, and f/u appt with renal next wk.  Does not want f/u psa today. Denies urinary symptoms such as dysuria, frequency, urgency, flank pain, hematuria or n/v, fever, chills - symptoms of OAB have improved with vesicare  5 mg every day.  Has appt with cardiology about June.    Due for flu shot today  no overt bleeding or bruising Wt Readings from Last 3 Encounters:  06/21/24 153 lb (69.4 kg)  04/07/24 148 lb 9.6 oz (67.4 kg)  03/22/24 152 lb (68.9 kg)   BP Readings from Last 3 Encounters:  06/21/24 110/68  04/07/24 (!) 176/70  03/22/24 118/76         Past Medical History:  Diagnosis Date   Alcohol abuse    hx   Anemia, iron  deficiency    AR (allergic rhinitis)    BPH (benign prostatic hypertrophy)    CAD (coronary artery disease) of artery bypass graft    CHF (congestive heart failure) (HCC)    CKD (chronic kidney disease) 09/30/2011   COPD (chronic obstructive pulmonary disease) (HCC)    Crohn's disease (HCC)    Dysuria    ED (erectile dysfunction) of organic origin    Fatigue    Glaucoma    Glucose intolerance (impaired glucose tolerance)    HLD (hyperlipidemia)    HTN (hypertension)    Impaired glucose tolerance 09/27/2011   MI (myocardial infarction) (HCC)    hx   Moderate or severe vision impairment, both eyes, impairment level  not further specified    Postherpetic neuralgia    PSA elevation    Routine general medical examination at a health care facility    Shingles    Skin cancer    hx; non-melanoma   Special screening for malignant neoplasm of prostate    Urinary frequency    Past Surgical History:  Procedure Laterality Date   CORONARY ANGIOPLASTY WITH STENT PLACEMENT     CORONARY ARTERY BYPASS GRAFT     11+ years ago; has an EF of about 45%, last Myoview  showed no ischemia 12/30/06   [facility]  2011   Dr. Althia    right cataract Bilateral 07/2010   with lens implant     reports that he quit smoking about 43 years ago. His smoking use included cigarettes. He has never used smokeless tobacco. He reports that he does not drink alcohol and does not use drugs. family history includes Heart disease in his mother. No Known Allergies Current Outpatient Medications on File Prior to Visit  Medication Sig Dispense Refill   Alcohol Swabs (B-D SINGLE USE SWABS REGULAR) PADS Use as directed twice daily E11.9 200 each 3   allopurinol  (ZYLOPRIM ) 100 MG tablet TAKE 1 TABLET EVERY DAY 90 tablet 3  aspirin 81 MG EC tablet Take 81 mg by mouth daily.     atorvastatin  (LIPITOR) 80 MG tablet TAKE 1 TABLET EVERY DAY AT 6:00 PM 90 tablet 3   Blood Glucose Calibration (TRUE METRIX LEVEL 1) Low SOLN Use as directed once daily E11.9 3 each 3   blood glucose meter kit and supplies KIT Use daily to check blood sugar levels. 1 each 0   carvedilol  (COREG ) 12.5 MG tablet TAKE 1 TABLET TWICE DAILY WITH MEALS 180 tablet 3   colchicine  0.6 MG tablet TAKE 1 TABLET EVERY DAY 90 tablet 3   ezetimibe  (ZETIA ) 10 MG tablet TAKE 1 TABLET EVERY DAY 90 tablet 3   HYDROcodone -acetaminophen  (NORCO/VICODIN) 5-325 MG tablet Take 1-2 tablets by mouth every 4 (four) hours as needed. 10 tablet 0   iron  polysaccharides (NIFEREX) 150 MG capsule TAKE 1 CAPSULE EVERY DAY 90 capsule 3   montelukast  (SINGULAIR ) 10 MG tablet Take 1 tablet (10 mg total) by mouth  daily. 90 tablet 3   Multiple Vitamins-Minerals (CENTRUM PO) Take 1 tablet by mouth daily.     nitroGLYCERIN  (NITROSTAT ) 0.4 MG SL tablet Place 0.4 mg under the tongue every 5 (five) minutes as needed for chest pain (Call 911 at 3rd dose within 15 minutes).     pantoprazole  (PROTONIX ) 40 MG tablet Take 1 tablet (40 mg total) by mouth daily. 90 tablet 3   solifenacin  (VESICARE ) 5 MG tablet TAKE 1 TABLET (5 MG TOTAL) BY MOUTH DAILY. 90 tablet 3   spironolactone  (ALDACTONE ) 25 MG tablet TAKE 1 TABLET EVERY DAY 90 tablet 3   traMADol  (ULTRAM ) 50 MG tablet Take 1 tablet (50 mg total) by mouth every 6 (six) hours as needed. 30 tablet 0   triamcinolone  (NASACORT ) 55 MCG/ACT AERO nasal inhaler Place 2 sprays into the nose daily. 1 Inhaler 12   cetirizine  (ZYRTEC ) 10 MG tablet Take 1 tablet (10 mg total) by mouth daily. 30 tablet 11   No current facility-administered medications on file prior to visit.        ROS:  All others reviewed and negative.  Objective        PE:  BP 110/68 (BP Location: Right Arm, Patient Position: Sitting, Cuff Size: Normal)   Pulse 61   Temp 98.3 F (36.8 C) (Oral)   Ht 5' 10 (1.778 m)   Wt 153 lb (69.4 kg)   SpO2 98%   BMI 21.95 kg/m                 Constitutional: Pt appears in NAD               HENT: Head: NCAT.                Right Ear: External ear normal.                 Left Ear: External ear normal.                Eyes: . Pupils are equal, round, and reactive to light. Conjunctivae and EOM are normal               Nose: without d/c or deformity               Neck: Neck supple. Gross normal ROM               Cardiovascular: Normal rate and regular rhythm.  Pulmonary/Chest: Effort normal and breath sounds without rales or wheezing.                Abd:  Soft, NT, ND, + BS, no organomegaly               Neurological: Pt is alert. At baseline orientation, motor grossly intact               Skin: Skin is warm. No rashes, no other new lesions,  LE edema - none               Psychiatric: Pt behavior is normal without agitation   Micro: none  Cardiac tracings I have personally interpreted today:  none  Pertinent Radiological findings (summarize): none   Lab Results  Component Value Date   WBC 4.7 03/22/2024   HGB 11.5 (L) 03/22/2024   HCT 35.7 (L) 03/22/2024   PLT 174.0 03/22/2024   GLUCOSE 100 (H) 03/22/2024   CHOL 109 03/22/2024   TRIG 45.0 03/22/2024   HDL 42.30 03/22/2024   LDLCALC 58 03/22/2024   ALT 21 03/22/2024   AST 29 03/22/2024   NA 137 03/22/2024   K 5.1 03/22/2024   CL 104 03/22/2024   CREATININE 2.01 (H) 03/22/2024   BUN 25 (H) 03/22/2024   CO2 29 03/22/2024   TSH 4.51 10/23/2022   PSA 4.93 (H) 04/29/2021   INR 1.1 12/16/2023   HGBA1C 6.2 03/22/2024   Assessment/Plan:  Gary Levy is a 83 y.o. Black or African American [2] male with  has a past medical history of Alcohol abuse, Anemia, iron  deficiency, AR (allergic rhinitis), BPH (benign prostatic hypertrophy), CAD (coronary artery disease) of artery bypass graft, CHF (congestive heart failure) (HCC), CKD (chronic kidney disease) (09/30/2011), COPD (chronic obstructive pulmonary disease) (HCC), Crohn's disease (HCC), Dysuria, ED (erectile dysfunction) of organic origin, Fatigue, Glaucoma, Glucose intolerance (impaired glucose tolerance), HLD (hyperlipidemia), HTN (hypertension), Impaired glucose tolerance (09/27/2011), MI (myocardial infarction) (HCC), Moderate or severe vision impairment, both eyes, impairment level not further specified, Postherpetic neuralgia, PSA elevation, Routine general medical examination at a health care facility, Shingles, Skin cancer, Special screening for malignant neoplasm of prostate, and Urinary frequency.  COPD (chronic obstructive pulmonary disease) (HCC) Stable, cont inhaler prn  Chronic systolic CHF (congestive heart failure) (HCC) Volume stable, cont current med tx  Vitamin D  deficiency Last vitamin D  Lab Results   Component Value Date   VD25OH 22.35 (L) 05/14/2023   Low, to start oral replacement   Impaired glucose tolerance Lab Results  Component Value Date   HGBA1C 6.2 03/22/2024   Stable, pt to continue current medical treatment  - diet, wt control   Hyperlipidemia Lab Results  Component Value Date   LDLCALC 58 03/22/2024   Stable, pt to continue current statin lipitor 80 mg every day, zetia  10 mg qd   Essential hypertension BP Readings from Last 3 Encounters:  06/21/24 110/68  04/07/24 (!) 176/70  03/22/24 118/76   Stable, pt to continue medical treatment coreg  12.5 bid,    CKD (chronic kidney disease) Lab Results  Component Value Date   CREATININE 2.01 (H) 03/22/2024   Stable by report, declines repeat lab today, pt to cont to avoid nephrotoxins, f/u renal as planned ckd3b  Anemia, iron  deficiency Also for iron  lab today  Followup: No follow-ups on file.  Gary Rush, MD 06/22/2024 8:39 AM [facility]

## 2024-06-21 NOTE — Assessment & Plan Note (Signed)
Last vitamin D Lab Results  Component Value Date   VD25OH 22.35 (L) 05/14/2023   Low, to start oral replacement

## 2024-06-21 NOTE — Assessment & Plan Note (Signed)
 Lab Results  Component Value Date   LDLCALC 58 03/22/2024   Stable, pt to continue current statin lipitor 80 mg every day, zetia  10 mg qd

## 2024-06-21 NOTE — Assessment & Plan Note (Signed)
Also for iron lab today

## 2024-06-21 NOTE — Patient Instructions (Addendum)
 You had the flu shot today  Please have your Shingrix (shingles) shots done at your local pharmacy, and the Tdap tetanus shot as well  Please continue all other medications as before, and refills have been done if requested.  Please have the pharmacy call with any other refills you may need.  Please continue your efforts at being more active, low cholesterol diet, and weight control.  Please keep your appointments with your specialists as you may have planned - renal and cardiology  Please go to the LAB at the blood drawing area for the tests to be done  - just the iron  testing today  You will be contacted by phone if any changes need to be made immediately.  Otherwise, you will receive a letter about your results with an explanation, but please check with MyChart first.  Please make an Appointment to return in 6 months, or sooner if needed

## 2024-06-21 NOTE — Assessment & Plan Note (Signed)
 Stable , cont inhaler prn

## 2024-06-22 ENCOUNTER — Other Ambulatory Visit: Payer: Self-pay | Admitting: Internal Medicine

## 2024-06-22 ENCOUNTER — Ambulatory Visit: Payer: Self-pay | Admitting: Internal Medicine

## 2024-06-22 DIAGNOSIS — D509 Iron deficiency anemia, unspecified: Secondary | ICD-10-CM

## 2024-06-22 DIAGNOSIS — K219 Gastro-esophageal reflux disease without esophagitis: Secondary | ICD-10-CM

## 2024-06-22 LAB — FERRITIN: Ferritin: 18.8 ng/mL — ABNORMAL LOW (ref 22.0–322.0)

## 2024-06-23 ENCOUNTER — Other Ambulatory Visit: Payer: Self-pay | Admitting: Internal Medicine

## 2024-06-23 ENCOUNTER — Telehealth: Payer: Self-pay

## 2024-06-23 ENCOUNTER — Telehealth: Payer: Self-pay | Admitting: Pharmacy Technician

## 2024-06-23 NOTE — Telephone Encounter (Signed)
 Dr. Norleen, patient will be scheduled as soon as possible.  Auth Submission: NO AUTH NEEDED Site of care: Site of care: [facility] Payer: [employer] medicare Medication & CPT/J Code(s) submitted: Venofer (Iron  Sucrose) J1756 Diagnosis Code:  Route of submission (phone, fax, portal):  Phone # Fax # Auth type: Buy/Bill PB Units/visits requested: 200mg  x 5 doses Reference number:  Approval from: 06/23/24 to 08/17/24

## 2024-06-23 NOTE — Telephone Encounter (Signed)
 Ok this has been changed, thanks

## 2024-06-23 NOTE — Telephone Encounter (Signed)
 Dr. Norleen, Allegra is non preferred and will be denied if patient has not failed preferred medication.  Preferred medication is Venofer. Would you like to use Venofer?  Auth Submission: NO AUTH NEEDED Site of care: Site of care: [facility] Payer: [employer] MEDICARE Medication & CPT/J Code(s) submitted: Venofer (Iron  Sucrose) J1756 Diagnosis Code: D50.9 Route of submission (phone, fax, portal):  Phone # Fax # Auth type: Buy/Bill PB Units/visits requested:  Reference number:  Approval from: 06/23/24 to 08/17/25

## 2024-06-27 ENCOUNTER — Ambulatory Visit (INDEPENDENT_AMBULATORY_CARE_PROVIDER_SITE_OTHER): Admitting: *Deleted

## 2024-06-27 VITALS — BP 149/77 | HR 50 | Temp 97.2°F | Resp 14 | Ht 70.5 in | Wt 152.4 lb

## 2024-06-27 DIAGNOSIS — D509 Iron deficiency anemia, unspecified: Secondary | ICD-10-CM | POA: Diagnosis not present

## 2024-06-27 MED ORDER — IRON SUCROSE 20 MG/ML IV SOLN
200.0000 mg | Freq: Once | INTRAVENOUS | Status: AC
  Administered 2024-06-27: 200 mg via INTRAVENOUS
  Filled 2024-06-27: qty 10

## 2024-06-27 NOTE — Patient Instructions (Signed)

## 2024-06-27 NOTE — Progress Notes (Signed)
 Diagnosis: Iron  Deficiency Anemia  Provider:  Mannam, Praveen MD  Procedure: IV Push  IV Type: Peripheral, IV Location: R Antecubital  Venofer  (Iron  Sucrose), Dose: 200 mg  Post Infusion IV Care: Observation period completed and Peripheral IV Discontinued  Discharge: Condition: Good, Destination: Home . AVS Provided  Performed by:  Trudy Lamarr LABOR, RN

## 2024-06-28 DIAGNOSIS — N2581 Secondary hyperparathyroidism of renal origin: Secondary | ICD-10-CM | POA: Diagnosis not present

## 2024-06-28 DIAGNOSIS — I255 Ischemic cardiomyopathy: Secondary | ICD-10-CM | POA: Diagnosis not present

## 2024-06-28 DIAGNOSIS — N1832 Chronic kidney disease, stage 3b: Secondary | ICD-10-CM | POA: Diagnosis not present

## 2024-06-28 DIAGNOSIS — I130 Hypertensive heart and chronic kidney disease with heart failure and stage 1 through stage 4 chronic kidney disease, or unspecified chronic kidney disease: Secondary | ICD-10-CM | POA: Diagnosis not present

## 2024-06-28 DIAGNOSIS — I5022 Chronic systolic (congestive) heart failure: Secondary | ICD-10-CM | POA: Diagnosis not present

## 2024-06-28 DIAGNOSIS — D509 Iron deficiency anemia, unspecified: Secondary | ICD-10-CM | POA: Diagnosis not present

## 2024-06-30 ENCOUNTER — Ambulatory Visit (INDEPENDENT_AMBULATORY_CARE_PROVIDER_SITE_OTHER): Admitting: *Deleted

## 2024-06-30 VITALS — BP 115/67 | HR 55 | Temp 97.3°F | Resp 16 | Ht 70.5 in | Wt 153.0 lb

## 2024-06-30 DIAGNOSIS — D509 Iron deficiency anemia, unspecified: Secondary | ICD-10-CM

## 2024-06-30 MED ORDER — IRON SUCROSE 20 MG/ML IV SOLN
200.0000 mg | Freq: Once | INTRAVENOUS | Status: AC
  Administered 2024-06-30: 200 mg via INTRAVENOUS
  Filled 2024-06-30: qty 10

## 2024-06-30 NOTE — Progress Notes (Signed)
 Diagnosis: Iron  Deficiency Anemia  Provider:  Mannam, Praveen MD  Procedure: IV Push  IV Type: Peripheral, IV Location: R Antecubital  Venofer  (Iron  Sucrose), Dose: 200 mg  Post Infusion IV Care: Observation period completed and Peripheral IV Discontinued  Discharge: Condition: Good, Destination: Home . AVS Provided  Performed by:  Trudy Lamarr LABOR, RN

## 2024-07-04 ENCOUNTER — Ambulatory Visit (INDEPENDENT_AMBULATORY_CARE_PROVIDER_SITE_OTHER)

## 2024-07-04 VITALS — BP 154/57 | HR 47 | Temp 97.1°F | Resp 14 | Ht 70.5 in | Wt 153.8 lb

## 2024-07-04 DIAGNOSIS — D509 Iron deficiency anemia, unspecified: Secondary | ICD-10-CM

## 2024-07-04 MED ORDER — IRON SUCROSE 20 MG/ML IV SOLN
200.0000 mg | Freq: Once | INTRAVENOUS | Status: AC
  Administered 2024-07-04: 200 mg via INTRAVENOUS
  Filled 2024-07-04: qty 10

## 2024-07-04 NOTE — Progress Notes (Signed)
 Diagnosis: Iron Deficiency Anemia  Provider:  Chilton Greathouse MD  Procedure: IV Push  IV Type: Peripheral, IV Location: R Antecubital  Venofer (Iron Sucrose), Dose: 200 mg  Post Infusion IV Care: Observation period completed and Peripheral IV Discontinued  Discharge: Condition: Good, Destination: Home . AVS Declined  Performed by:  Rico Ala, LPN

## 2024-07-06 ENCOUNTER — Ambulatory Visit

## 2024-07-06 VITALS — BP 135/64 | HR 55 | Temp 96.8°F | Resp 16 | Ht 70.5 in | Wt 152.4 lb

## 2024-07-06 DIAGNOSIS — D509 Iron deficiency anemia, unspecified: Secondary | ICD-10-CM | POA: Diagnosis not present

## 2024-07-06 MED ORDER — IRON SUCROSE 20 MG/ML IV SOLN
200.0000 mg | Freq: Once | INTRAVENOUS | Status: AC
  Administered 2024-07-06: 200 mg via INTRAVENOUS
  Filled 2024-07-06: qty 10

## 2024-07-06 NOTE — Progress Notes (Signed)
 Diagnosis: Iron Deficiency Anemia  Provider:  Praveen Mannam MD  Procedure: IV Push  IV Type: Peripheral, IV Location: R Antecubital  Venofer (Iron Sucrose), Dose: 200 mg  Post Infusion IV Care: Observation period completed and Peripheral IV Discontinued  Discharge: Condition: Good, Destination: Home . AVS Declined  Performed by:  Khristina Janota, RN

## 2024-07-08 ENCOUNTER — Ambulatory Visit (INDEPENDENT_AMBULATORY_CARE_PROVIDER_SITE_OTHER)

## 2024-07-08 VITALS — BP 153/66 | HR 61 | Temp 97.0°F | Resp 16 | Ht 70.2 in | Wt 152.8 lb

## 2024-07-08 DIAGNOSIS — D509 Iron deficiency anemia, unspecified: Secondary | ICD-10-CM | POA: Diagnosis not present

## 2024-07-08 MED ORDER — IRON SUCROSE 20 MG/ML IV SOLN
200.0000 mg | Freq: Once | INTRAVENOUS | Status: AC
  Administered 2024-07-08: 200 mg via INTRAVENOUS
  Filled 2024-07-08: qty 10

## 2024-07-08 NOTE — Progress Notes (Signed)
 Diagnosis: Iron Deficiency Anemia  Provider:  Chilton Greathouse MD  Procedure: IV Push  IV Type: Peripheral, IV Location: R Antecubital  Venofer (Iron Sucrose), Dose: 200 mg  Post Infusion IV Care: Observation period completed and Peripheral IV Discontinued  Discharge: Condition: Good, Destination: Home . AVS Provided  Performed by:  Rico Ala, LPN

## 2024-09-06 ENCOUNTER — Telehealth: Payer: Self-pay

## 2024-09-06 NOTE — Telephone Encounter (Signed)
 Yes, this was due to the Iron  Deficiency anemia, and the need to check the GI tract to make sure of any reason for blood loss and bleeding.  Thanks!

## 2024-09-06 NOTE — Telephone Encounter (Signed)
 Copied from CRM [id number]. Topic: Referral - Question >> Sep 06, 2024  1:52 PM Alfonso HERO wrote: Reason for CRM: patient calling questioning why a referral was sent to GI. Asking for a call back.

## 2024-09-09 NOTE — Telephone Encounter (Signed)
 Attempted to call patient but had to leave a voice message for call back.

## 2024-09-09 NOTE — Telephone Encounter (Signed)
 Patient has returned missed call and I advised him per Dr Norleen why the GI referral was placed. Patient stated that he told the GI clinic that he will schedule at another time because he did not want to do it at the moment.

## 2024-09-19 ENCOUNTER — Other Ambulatory Visit: Payer: Self-pay | Admitting: Internal Medicine

## 2024-09-20 ENCOUNTER — Other Ambulatory Visit: Payer: Self-pay

## 2024-12-20 ENCOUNTER — Ambulatory Visit: Admitting: Internal Medicine

## 2025-03-23 ENCOUNTER — Ambulatory Visit
